# Patient Record
Sex: Male | Born: 1935 | Race: Black or African American | Hispanic: No | Marital: Single | Smoking: Former smoker
Health system: Southern US, Community
[De-identification: ages and names within clinical notes are randomized; demographics above are authoritative.]

## PROBLEM LIST (undated history)

## (undated) DIAGNOSIS — I10 Essential (primary) hypertension: Secondary | ICD-10-CM

## (undated) DIAGNOSIS — R0602 Shortness of breath: Secondary | ICD-10-CM

## (undated) DIAGNOSIS — M1990 Unspecified osteoarthritis, unspecified site: Secondary | ICD-10-CM

## (undated) DIAGNOSIS — E119 Type 2 diabetes mellitus without complications: Secondary | ICD-10-CM

## (undated) DIAGNOSIS — I219 Acute myocardial infarction, unspecified: Secondary | ICD-10-CM

## (undated) DIAGNOSIS — K5909 Other constipation: Secondary | ICD-10-CM

## (undated) DIAGNOSIS — E785 Hyperlipidemia, unspecified: Secondary | ICD-10-CM

## (undated) DIAGNOSIS — H9190 Unspecified hearing loss, unspecified ear: Secondary | ICD-10-CM

## (undated) DIAGNOSIS — K219 Gastro-esophageal reflux disease without esophagitis: Secondary | ICD-10-CM

## (undated) DIAGNOSIS — K08109 Complete loss of teeth, unspecified cause, unspecified class: Secondary | ICD-10-CM

## (undated) DIAGNOSIS — H269 Unspecified cataract: Secondary | ICD-10-CM

## (undated) DIAGNOSIS — R351 Nocturia: Secondary | ICD-10-CM

## (undated) DIAGNOSIS — I2510 Atherosclerotic heart disease of native coronary artery without angina pectoris: Secondary | ICD-10-CM

## (undated) DIAGNOSIS — Z972 Presence of dental prosthetic device (complete) (partial): Secondary | ICD-10-CM

## (undated) DIAGNOSIS — Z9989 Dependence on other enabling machines and devices: Secondary | ICD-10-CM

## (undated) DIAGNOSIS — I4510 Unspecified right bundle-branch block: Secondary | ICD-10-CM

## (undated) DIAGNOSIS — R112 Nausea with vomiting, unspecified: Secondary | ICD-10-CM

## (undated) DIAGNOSIS — C61 Malignant neoplasm of prostate: Secondary | ICD-10-CM

## (undated) DIAGNOSIS — Z9889 Other specified postprocedural states: Secondary | ICD-10-CM

## (undated) DIAGNOSIS — H409 Unspecified glaucoma: Secondary | ICD-10-CM

## (undated) DIAGNOSIS — I739 Peripheral vascular disease, unspecified: Secondary | ICD-10-CM

## (undated) DIAGNOSIS — Z973 Presence of spectacles and contact lenses: Secondary | ICD-10-CM

## (undated) HISTORY — PX: CORONARY ANGIOPLASTY WITH STENT PLACEMENT: SHX49

## (undated) HISTORY — PX: BACK SURGERY: SHX140

---

## 1999-12-15 DIAGNOSIS — I252 Old myocardial infarction: Secondary | ICD-10-CM

## 1999-12-15 DIAGNOSIS — Z955 Presence of coronary angioplasty implant and graft: Secondary | ICD-10-CM

## 1999-12-15 HISTORY — PX: CORONARY ANGIOPLASTY WITH STENT PLACEMENT: SHX49

## 1999-12-15 HISTORY — DX: Old myocardial infarction: I25.2

## 1999-12-15 HISTORY — DX: Presence of coronary angioplasty implant and graft: Z95.5

## 1999-12-24 ENCOUNTER — Encounter: Payer: Self-pay | Admitting: Emergency Medicine

## 1999-12-24 ENCOUNTER — Inpatient Hospital Stay (HOSPITAL_COMMUNITY): Admission: EM | Admit: 1999-12-24 | Discharge: 1999-12-25 | Payer: Self-pay | Admitting: Emergency Medicine

## 2002-06-16 DIAGNOSIS — I219 Acute myocardial infarction, unspecified: Secondary | ICD-10-CM

## 2002-06-16 HISTORY — DX: Acute myocardial infarction, unspecified: I21.9

## 2003-02-15 HISTORY — PX: CORONARY ARTERY BYPASS GRAFT: SHX141

## 2003-02-27 ENCOUNTER — Encounter: Payer: Self-pay | Admitting: Cardiology

## 2003-02-27 ENCOUNTER — Inpatient Hospital Stay (HOSPITAL_COMMUNITY): Admission: EM | Admit: 2003-02-27 | Discharge: 2003-03-07 | Payer: Self-pay | Admitting: Emergency Medicine

## 2003-02-27 DIAGNOSIS — Z951 Presence of aortocoronary bypass graft: Secondary | ICD-10-CM

## 2003-02-27 HISTORY — PX: CORONARY ARTERY BYPASS GRAFT: SHX141

## 2003-02-27 HISTORY — DX: Presence of aortocoronary bypass graft: Z95.1

## 2003-03-01 ENCOUNTER — Encounter: Payer: Self-pay | Admitting: Cardiology

## 2003-03-02 ENCOUNTER — Encounter: Payer: Self-pay | Admitting: Thoracic Surgery (Cardiothoracic Vascular Surgery)

## 2003-03-03 ENCOUNTER — Encounter: Payer: Self-pay | Admitting: Thoracic Surgery (Cardiothoracic Vascular Surgery)

## 2003-03-04 ENCOUNTER — Encounter: Payer: Self-pay | Admitting: Thoracic Surgery (Cardiothoracic Vascular Surgery)

## 2003-03-05 ENCOUNTER — Encounter: Payer: Self-pay | Admitting: Thoracic Surgery (Cardiothoracic Vascular Surgery)

## 2003-03-06 ENCOUNTER — Encounter: Payer: Self-pay | Admitting: Thoracic Surgery (Cardiothoracic Vascular Surgery)

## 2003-03-07 ENCOUNTER — Encounter: Payer: Self-pay | Admitting: Thoracic Surgery (Cardiothoracic Vascular Surgery)

## 2011-06-17 HISTORY — PX: CATARACT EXTRACTION W/ INTRAOCULAR LENS IMPLANT: SHX1309

## 2013-02-25 ENCOUNTER — Other Ambulatory Visit (HOSPITAL_COMMUNITY): Payer: Self-pay | Admitting: Internal Medicine

## 2013-02-25 DIAGNOSIS — K8020 Calculus of gallbladder without cholecystitis without obstruction: Secondary | ICD-10-CM

## 2013-02-28 ENCOUNTER — Encounter (HOSPITAL_COMMUNITY): Payer: Self-pay | Admitting: General Surgery

## 2013-02-28 ENCOUNTER — Inpatient Hospital Stay (HOSPITAL_COMMUNITY)
Admission: EM | Admit: 2013-02-28 | Discharge: 2013-03-03 | DRG: 418 | Disposition: A | Discharge status: Home / Self Care | Payer: Medicare Other | Attending: Internal Medicine | Admitting: Internal Medicine

## 2013-02-28 ENCOUNTER — Other Ambulatory Visit: Payer: Self-pay

## 2013-02-28 DIAGNOSIS — R1011 Right upper quadrant pain: Secondary | ICD-10-CM

## 2013-02-28 DIAGNOSIS — Z9861 Coronary angioplasty status: Secondary | ICD-10-CM

## 2013-02-28 DIAGNOSIS — Z66 Do not resuscitate: Secondary | ICD-10-CM | POA: Diagnosis present

## 2013-02-28 DIAGNOSIS — I10 Essential (primary) hypertension: Secondary | ICD-10-CM

## 2013-02-28 DIAGNOSIS — M1990 Unspecified osteoarthritis, unspecified site: Secondary | ICD-10-CM | POA: Diagnosis present

## 2013-02-28 DIAGNOSIS — Z01810 Encounter for preprocedural cardiovascular examination: Secondary | ICD-10-CM

## 2013-02-28 DIAGNOSIS — K810 Acute cholecystitis: Secondary | ICD-10-CM

## 2013-02-28 DIAGNOSIS — F17200 Nicotine dependence, unspecified, uncomplicated: Secondary | ICD-10-CM | POA: Diagnosis present

## 2013-02-28 DIAGNOSIS — E119 Type 2 diabetes mellitus without complications: Secondary | ICD-10-CM | POA: Diagnosis present

## 2013-02-28 DIAGNOSIS — I25810 Atherosclerosis of coronary artery bypass graft(s) without angina pectoris: Secondary | ICD-10-CM

## 2013-02-28 DIAGNOSIS — I96 Gangrene, not elsewhere classified: Secondary | ICD-10-CM | POA: Diagnosis present

## 2013-02-28 DIAGNOSIS — J90 Pleural effusion, not elsewhere classified: Secondary | ICD-10-CM

## 2013-02-28 DIAGNOSIS — Z951 Presence of aortocoronary bypass graft: Secondary | ICD-10-CM

## 2013-02-28 DIAGNOSIS — K8000 Calculus of gallbladder with acute cholecystitis without obstruction: Principal | ICD-10-CM | POA: Diagnosis present

## 2013-02-28 DIAGNOSIS — K219 Gastro-esophageal reflux disease without esophagitis: Secondary | ICD-10-CM | POA: Diagnosis present

## 2013-02-28 DIAGNOSIS — K8020 Calculus of gallbladder without cholecystitis without obstruction: Secondary | ICD-10-CM

## 2013-02-28 DIAGNOSIS — I252 Old myocardial infarction: Secondary | ICD-10-CM

## 2013-02-28 DIAGNOSIS — E785 Hyperlipidemia, unspecified: Secondary | ICD-10-CM | POA: Diagnosis present

## 2013-02-28 DIAGNOSIS — K819 Cholecystitis, unspecified: Secondary | ICD-10-CM

## 2013-02-28 DIAGNOSIS — I2510 Atherosclerotic heart disease of native coronary artery without angina pectoris: Secondary | ICD-10-CM | POA: Diagnosis present

## 2013-02-28 HISTORY — DX: Unspecified osteoarthritis, unspecified site: M19.90

## 2013-02-28 HISTORY — DX: Gastro-esophageal reflux disease without esophagitis: K21.9

## 2013-02-28 HISTORY — DX: Atherosclerotic heart disease of native coronary artery without angina pectoris: I25.10

## 2013-02-28 HISTORY — DX: Essential (primary) hypertension: I10

## 2013-02-28 HISTORY — DX: Type 2 diabetes mellitus without complications: E11.9

## 2013-02-28 HISTORY — DX: Acute myocardial infarction, unspecified: I21.9

## 2013-02-28 HISTORY — DX: Unspecified cataract: H26.9

## 2013-02-28 HISTORY — DX: Hyperlipidemia, unspecified: E78.5

## 2013-02-28 LAB — COMPREHENSIVE METABOLIC PANEL
ALT: 15 U/L (ref 0–53)
AST: 19 U/L (ref 0–37)
CO2: 25 mEq/L (ref 19–32)
Chloride: 95 mEq/L — ABNORMAL LOW (ref 96–112)
Creatinine, Ser: 1.06 mg/dL (ref 0.50–1.35)
GFR calc non Af Amer: 65 mL/min — ABNORMAL LOW (ref >90)
Total Bilirubin: 1 mg/dL (ref 0.3–1.2)

## 2013-02-28 LAB — CBC WITH DIFFERENTIAL/PLATELET
Basophils Relative: 0 % (ref 0–1)
Eosinophils Absolute: 0 10*3/uL (ref 0.0–0.7)
MCH: 32.5 pg (ref 26.0–34.0)
MCHC: 35.3 g/dL (ref 30.0–36.0)
Neutrophils Relative %: 81 % — ABNORMAL HIGH (ref 43–77)
Platelets: 170 10*3/uL (ref 150–400)
RDW: 13.7 % (ref 11.5–15.5)

## 2013-02-28 LAB — PROTIME-INR: Prothrombin Time: 15.4 seconds — ABNORMAL HIGH (ref 11.6–15.2)

## 2013-02-28 LAB — LIPASE, BLOOD: Lipase: 26 U/L (ref 11–59)

## 2013-02-28 MED ORDER — POTASSIUM CHLORIDE IN NACL 20-0.9 MEQ/L-% IV SOLN
INTRAVENOUS | Status: DC
  Administered 2013-02-28 – 2013-03-02 (×3): via INTRAVENOUS
  Filled 2013-02-28 (×7): qty 1000

## 2013-02-28 MED ORDER — LISINOPRIL 5 MG PO TABS
5.0000 mg | ORAL_TABLET | Freq: Every day | ORAL | Status: DC
  Administered 2013-03-01 – 2013-03-03 (×2): 5 mg via ORAL
  Filled 2013-02-28 (×3): qty 1

## 2013-02-28 MED ORDER — INSULIN ASPART 100 UNIT/ML ~~LOC~~ SOLN: 0.0000 [IU] | Freq: Every day | SUBCUTANEOUS | Status: DC

## 2013-02-28 MED ORDER — PANTOPRAZOLE SODIUM 40 MG PO TBEC
40.0000 mg | DELAYED_RELEASE_TABLET | Freq: Every day | ORAL | Status: DC
  Administered 2013-03-01 – 2013-03-03 (×2): 40 mg via ORAL
  Filled 2013-02-28: qty 1

## 2013-02-28 MED ORDER — ACETAMINOPHEN 325 MG PO TABS: 650.0000 mg | ORAL_TABLET | Freq: Four times a day (QID) | ORAL | Status: DC | PRN

## 2013-02-28 MED ORDER — ONDANSETRON HCL 4 MG PO TABS: 4.0000 mg | ORAL_TABLET | Freq: Four times a day (QID) | ORAL | Status: DC | PRN

## 2013-02-28 MED ORDER — INSULIN ASPART 100 UNIT/ML ~~LOC~~ SOLN
0.0000 [IU] | Freq: Four times a day (QID) | SUBCUTANEOUS | Status: DC
  Administered 2013-03-01: 2 [IU] via SUBCUTANEOUS
  Administered 2013-03-03: 3 [IU] via SUBCUTANEOUS

## 2013-02-28 MED ORDER — SUCRALFATE 1 GM/10ML PO SUSP
1.0000 g | Freq: Four times a day (QID) | ORAL | Status: DC
  Administered 2013-03-01 (×5): 1 g via ORAL
  Filled 2013-02-28 (×9): qty 10

## 2013-02-28 MED ORDER — SODIUM CHLORIDE 0.9 % IJ SOLN
3.0000 mL | Freq: Two times a day (BID) | INTRAMUSCULAR | Status: DC
  Administered 2013-02-28 – 2013-03-03 (×3): 3 mL via INTRAVENOUS

## 2013-02-28 MED ORDER — ATORVASTATIN CALCIUM 40 MG PO TABS
40.0000 mg | ORAL_TABLET | Freq: Every day | ORAL | Status: DC
  Filled 2013-02-28: qty 1

## 2013-02-28 MED ORDER — ACETAMINOPHEN 650 MG RE SUPP: 650.0000 mg | Freq: Four times a day (QID) | RECTAL | Status: DC | PRN

## 2013-02-28 MED ORDER — ENOXAPARIN SODIUM 40 MG/0.4ML ~~LOC~~ SOLN
40.0000 mg | SUBCUTANEOUS | Status: DC
  Administered 2013-03-01: 40 mg via SUBCUTANEOUS
  Filled 2013-02-28: qty 0.4

## 2013-02-28 MED ORDER — MORPHINE SULFATE 2 MG/ML IJ SOLN
2.0000 mg | INTRAMUSCULAR | Status: DC | PRN
  Administered 2013-03-02: 2 mg via INTRAVENOUS
  Filled 2013-02-28: qty 1

## 2013-02-28 MED ORDER — HYDROCODONE-ACETAMINOPHEN 5-325 MG PO TABS
1.0000 | ORAL_TABLET | ORAL | Status: DC | PRN
  Administered 2013-03-02: 2 via ORAL
  Filled 2013-02-28: qty 2

## 2013-02-28 MED ORDER — SODIUM CHLORIDE 0.9 % IV SOLN
INTRAVENOUS | Status: DC
  Administered 2013-02-28 (×2): via INTRAVENOUS

## 2013-02-28 MED ORDER — ONDANSETRON HCL 4 MG/2ML IJ SOLN: 4.0000 mg | Freq: Four times a day (QID) | INTRAMUSCULAR | Status: DC | PRN

## 2013-02-28 MED ORDER — METFORMIN HCL 500 MG PO TABS
250.0000 mg | ORAL_TABLET | Freq: Two times a day (BID) | ORAL | Status: DC
  Filled 2013-02-28 (×2): qty 1

## 2013-02-28 NOTE — ED Notes (Signed)
Spoke with Aundra Millet with Surgery and she felt like EDP should see patient first because they donot have actual scan information

## 2013-02-28 NOTE — ED Notes (Signed)
Per surgery consult: Pt will not be going to surgery today and can be started on a clear liquid diet.

## 2013-02-28 NOTE — ED Notes (Signed)
Attempted to call report.  Floor assigned, waiting on room assignment. Will have RN call when it is assigned.

## 2013-02-28 NOTE — Consult Note (Signed)
Reason for Consult: abdominal pain Referring Physician: Levonne Lapping, NP  HPI:  Harold [name] is a 79 year old AAM with a history of Diabetes Mellitus Type II, Hypertension,HLD, CAD s/p angioplasty x2 in 2001 following by CABG x4 in 2004, GERD, ED, RBBB who initially presented to PCPs office last Friday and was seen by Levonne Lapping, NP for epigastric abdominal pain.  Apparently, he had a [place] which showed "large amount of gas" and therefore unable to be read.  WBC 12.4, normal lipase and BMP.  He presented once again today with worsening symptoms.  He subsequently underwent a CT of abdomen and pelvis which revealed gallstones and concerns for acute cholecystitis along with right side pleural effusion.  His pain initially began on Friday.  Onset was sudden at 2AM.  He denies consuming a high fatty meal.  Coarse is worsening.  Associated with nausea and vomiting, which helped ease symptoms initially.  Reports feeling bloated and unable to pass gas for 2 days now..  Had a poor appetite, chills and sweats.  Denies changes in bowel or urinary pattern.  Last bowel movement was 2 days ago, which is usual for him.  Deneis melena, hematochezia or weight changes.  Denies prior symptoms.  No modifying factors.  He is otherwise doing well.  He has not seen his cardiologist in quite some time.  He does not take blood thinners or NSAIDs.   Past Medical History  Diagnosis Date  . Type I (juvenile type) diabetes mellitus without mention of complication, not stated as uncontrolled   . CAD (coronary artery disease)   . Hyperlipidemia   . Unspecified essential hypertension   . Myocardial infarction   . Cataract     Past Surgical History  Procedure Laterality Date  . Coronary artery bypass graft    . Back surgery  1970s  . Cataract extraction      Family History  Problem Relation Age of Onset  . Heart disease Mother   . Diabetes Mother     Social History:  reports that he has quit smoking. He does not  have any smokeless tobacco history on file. He reports that he does not drink alcohol or use illicit drugs.   Lives with daughter  Allergies: No Known Allergies  Medications: reviewed  Review of Systems  Constitutional: Positive for fever, malaise/fatigue and diaphoresis. Negative for chills and weight loss.  HENT: Negative for congestion and sore throat.   Respiratory: Negative for shortness of breath and wheezing.   Cardiovascular: Negative for chest pain, palpitations and leg swelling.  Gastrointestinal: Positive for nausea, vomiting, abdominal pain and constipation. Negative for diarrhea.  Genitourinary: Negative for dysuria, urgency and hematuria.  Neurological: Positive for dizziness and weakness. Negative for sensory change, speech change, focal weakness, seizures, loss of consciousness and headaches.   Blood pressure 141/65, pulse 80, temperature 97.8 F (36.6 C), temperature source Oral, resp. rate 18, SpO2 98.00%. Physical Exam General: pleasant, WD/WN AA male who is laying in bed in NAD HEENT: head is normocephalic, atraumatic.  Sclera are noninjected.  PERRL.  Ears and nose without any masses or lesions.  Mouth is pink and moist Heart: regular, rate, and rhythm.  Normal s1,s2. No obvious murmurs, gallops, or rubs noted.  Palpable radial and pedal pulses bilaterally Lungs: CTAB, no wheezes, rhonchi, or rales noted.  Respiratory effort nonlabored Abd: thin, soft, ND, moderate to severe tenderness in the RUQ epigastrium, +murphys sign, +BS, no masses, hernias, or organomegaly MS: all 4 extremities  are symmetrical with no cyanosis, clubbing, or edema. Skin: warm and dry with no masses, lesions, or rashes Psych: A&Ox3 with an appropriate affect.   Assessment/Plan: RUQ and epigastric abdominal pain Symptomatic cholelithiasis with probable Acute Cholecystitis  Right sided pleural effusion DM CAD/CABG HTN -recommend evaluation with further work up; CBC, CMP, Lipase, IV  hydration -he may need a cardiac work up, but will leave up to primary team, needs ekg at least right now due to history and pain -IV atbx -Clear liquid diet, NPO after midnight -pain control, antiemetics -we will likely proceed with  laparoscopic cholecystectomy tomorrow pending lab results.  Ashok Norris ANP-BC Pager [phone]  02/28/2013, 4:03 PM

## 2013-02-28 NOTE — H&P (Signed)
PCP:   GATES,ROBERT NEVILL, MD   Chief Complaint:  Abdominal pain  HPI: This is a 79 year old gentleman who presents after he has had abdominal pain since Friday 2 AM. He says PCP on Friday and was sent for ultrasound which was inconclusive due to bowel gas. The pain persisted over the weekend therefore he saw her again today, his CT abdomen and pelvis was ordered which revealed acute cholecystitis. These studies were done at an outpatient center. The patient reports he's had nausea vomiting all weekend, no hematemesis. No diarrhea. Patient's pain is epigastric really located with no radiation. It is sharp and then dull. It is worse with walking and with movement. He denies any fever, chills or diarrhea.   The patient does have a history of coronary artery disease with remote history of CABG over 10 years ago. He states he gets annual stress tests which are normal. He states he never gets chest pain, left arm pain, shortness of breath or wheezing, palpitations, no history of congestive heart failure. The patient lives alone, he does all his ADLs. He mows his lawn, climbs his roof and washes his car.   History provided by the patient as well as his daughter and other family members at the bedside.  Review of Systems:  The patient denies anorexia, fever, weight loss,, vision loss, decreased hearing, hoarseness, chest pain, syncope, dyspnea on exertion, peripheral edema, balance deficits, hemoptysis, melena, hematochezia, severe indigestion/heartburn, hematuria, incontinence, genital sores, muscle weakness, suspicious skin lesions, transient blindness, difficulty walking, depression, unusual weight change, abnormal bleeding, enlarged lymph nodes, angioedema, and breast masses.  Past Medical History: Past Medical History  Diagnosis Date  . Type I (juvenile type) diabetes mellitus without mention of complication, not stated as uncontrolled   . CAD (coronary artery disease)   . Hyperlipidemia   .  Unspecified essential hypertension   . Myocardial infarction   . Cataract    Past Surgical History  Procedure Laterality Date  . Coronary artery bypass graft    . Back surgery  1970s  . Cataract extraction      Medications: Prior to Admission medications   Medication Sig Start Date End Date Taking? Authorizing Provider  aspirin EC 325 MG tablet Take 162.5 mg by mouth daily.   Yes Historical Provider, MD  CARAFATE 1 GM/10ML suspension Take 1 g by mouth 4 (four) times daily.  02/25/13  Yes Historical Provider, MD  lisinopril (PRINIVIL,ZESTRIL) 5 MG tablet Take 5 mg by mouth daily.  02/02/13  Yes Historical Provider, MD  metFORMIN (GLUCOPHAGE) 500 MG tablet Take 250-500 mg by mouth 2 (two) times daily with a meal. 1/2 tablet (250 mg) if cbg under 110, 1 tablet (500 mg) if over 110   Yes Historical Provider, MD  metroNIDAZOLE (FLAGYL) 250 MG tablet Take 250 mg by mouth 2 (two) times daily. 7 day course started 02/25/13 12/22/12  Yes Historical Provider, MD  omeprazole (PRILOSEC) 40 MG capsule Take 40 mg by mouth daily.  02/25/13  Yes Historical Provider, MD  simvastatin (ZOCOR) 80 MG tablet Take 40 mg by mouth daily.   Yes Historical Provider, MD  traMADol (ULTRAM) 50 MG tablet Take 50 mg by mouth every 6 (six) hours as needed for pain.  02/25/13  Yes Historical Provider, MD    Allergies:  No Known Allergies  Social History:  reports that he has quit smoking. He does not have any smokeless tobacco history on file. He reports that he does not drink alcohol or use illicit  drugs.  Family History: Family History  Problem Relation Age of Onset  . Heart disease Mother   . Diabetes Mother     Physical Exam: Filed Vitals:   02/28/13 1816 02/28/13 1900 02/28/13 2000 02/28/13 2100  BP: 157/64 135/69 147/65 159/70  Pulse: 74 78 77 87  Temp: 98.5 F (36.9 C)     TempSrc: Oral     Resp: 18 18 15 16   SpO2: 96% 97% 100% 99%    General:  Alert and oriented times three, well developed and  nourished, no acute distress Eyes: PERRLA, pink conjunctiva, no scleral icterus ENT: Moist oral mucosa, neck supple, no thyromegaly Lungs: clear to ascultation, no wheeze, no crackles, no use of accessory muscles Cardiovascular: regular rate and rhythm, no regurgitation, no gallops, no murmurs. No carotid bruits, no JVD Abdomen: soft, positive BS, , non-distended, positive tenderness to palpation greatest in the epigastric region, no organomegaly, not an acute abdomen GU: not examined Neuro: CN II - XII grossly intact, sensation intact Musculoskeletal: strength 5/5 all extremities, no clubbing, cyanosis or edema Skin: no rash, no subcutaneous crepitation, no decubitus Psych: appropriate patient   Labs on Admission:   Recent Labs  02/28/13 1512  NA 132*  K 4.7  CL 95*  CO2 25  GLUCOSE 130*  BUN 25*  CREATININE 1.06  CALCIUM 10.1    Recent Labs  02/28/13 1512  AST 19  ALT 15  ALKPHOS 74  BILITOT 1.0  PROT 8.0  ALBUMIN 3.5    Recent Labs  02/28/13 1531  LIPASE 26    Recent Labs  02/28/13 1512  WBC 13.0*  NEUTROABS 10.6*  HGB 15.2  HCT 43.0  MCV 91.9  PLT 170    Micro Results: No results found for this or any previous visit (from the past 240 hour(s)).   Radiological Exams on Admission: Dg Outside Films Body  02/28/2013   This examination belongs to an outside facility and is stored  here for comparison purposes only.  Contact the originating outside  institution for any associated report or interpretation.  imaging reviewed    EKG: Bifascicular block, normal sinus rhythm  Assessment/Plan Present on Admission:  Acute cholecystitis: Admit to med telemetry, n.p.o., surgeon on board  As needed pain medication, IV fluid hydration Will call cardiology in the a.m. for clearance, abnormal EKG. Will also request a old EKG from PCPs office for comparison. Coronary artery disease Dyslipidemia Hypertension Diabetes mellitus Stable resume home  medications Sliding scale insulin  DO NOT RESUSCITATE DVT prophylaxis  Lilja Soland 02/28/2013, 9:37 PM

## 2013-02-28 NOTE — ED Notes (Signed)
Pt refusing IV 

## 2013-02-28 NOTE — ED Notes (Signed)
Report to [facility] on 6N.  Pt to be telemetry for at least next 48 hours.

## 2013-02-28 NOTE — ED Notes (Signed)
Pt has inflammed gallbladder with stones and was sent here to be admitted for removal of gallbladder

## 2013-02-28 NOTE — ED Provider Notes (Signed)
CSN: [id number]     Arrival date & time 02/28/13  1422 History   First MD Initiated Contact with Patient 02/28/13 1651     No chief complaint on file.  (Consider location/radiation/quality/duration/timing/severity/associated sxs/prior Treatment) HPI Comments: Harold [name] is a 79 y.o. male who presents for evaluation of an abnormal gallbladder, found on CT imaging, today. He saw his PCP earlier today, was sent for imaging, and then sent to the ED for evaluation. Surgery has been contacted, but wanted an ER evaluation, first. The patient has had decreased appetite, malaise, and a single episode of vomiting in the last 3 days. He denies abnormal stooling, fever, chills, cough, shortness of breath, chest pain, weakness, or dizziness. He has a remote history of coronary artery bypass grafting. He does not see cardiology regularly. He, states that he takes one half aspirin each day. He's been using his medications, as prescribed, without relief of his discomfort. There are no other known modifying factors.  The history is provided by the patient.    Past Medical History  Diagnosis Date  . CAD (coronary artery disease)   . Hyperlipidemia   . Unspecified essential hypertension   . Cataract   . Myocardial infarction 2004  . Type II diabetes mellitus   . GERD (gastroesophageal reflux disease)   . Arthritis     "generalized" (03/01/2013)   Past Surgical History  Procedure Laterality Date  . Cataract extraction w/ intraocular lens implant Left 2013  . Coronary angioplasty with stent placement      "had 2-3; they clogged; he later had OHS" (03/01/2013)  . Coronary artery bypass graft  02/2003    "triple" (03/01/2013)  . Back surgery  1970s   Family History  Problem Relation Age of Onset  . Heart disease Mother   . Diabetes Mother    History  Substance Use Topics  . Smoking status: Current Some Day Smoker -- 30 years    Types: Cigarettes  . Smokeless tobacco: Never Used  . Alcohol Use:  Yes     Comment: 03/01/2013 "drank years ago; never had problem w/it"    Review of Systems  All other systems reviewed and are negative.    Allergies  Review of patient's allergies indicates no known allergies.  Home Medications   No current outpatient prescriptions on file. BP 157/45  Pulse 74  Temp(Src) 98.4 F (36.9 C) (Oral)  Resp 19  Ht 5\' 9"  (1.753 m)  Wt 163 lb 2.3 oz (74 kg)  BMI 24.08 kg/m2  SpO2 99% Physical Exam  Nursing note and vitals reviewed. Constitutional: He is oriented to person, place, and time. He appears well-developed.  Healthy appearing, elderly, male  HENT:  Head: Normocephalic and atraumatic.  Right Ear: External ear normal.  Left Ear: External ear normal.  Eyes: Conjunctivae and EOM are normal. Pupils are equal, round, and reactive to light.  Neck: Normal range of motion and phonation normal. Neck supple.  Cardiovascular: Normal rate, regular rhythm, normal heart sounds and intact distal pulses.   Pulmonary/Chest: Effort normal and breath sounds normal. He exhibits no bony tenderness.  Abdominal: Soft. Normal appearance. He exhibits no distension and no mass. There is no tenderness. There is no guarding.  Musculoskeletal: Normal range of motion. He exhibits no edema.  Neurological: He is alert and oriented to person, place, and time. He has normal strength. No cranial nerve deficit or sensory deficit. He exhibits normal muscle tone. Coordination normal.  Skin: Skin is warm, dry and intact.  Psychiatric: He has a normal mood and affect. His behavior is normal. Judgment and thought content normal.    ED Course  Procedures (including critical care time)  Medications  0.9 %  sodium chloride infusion ( Intravenous Transfusing/Transfer 02/28/13 2059)  sucralfate (CARAFATE) 1 GM/10ML suspension 1 g (1 g Oral Given 03/01/13 1300)  lisinopril (PRINIVIL,ZESTRIL) tablet 5 mg (5 mg Oral Given 03/01/13 1023)  pantoprazole (PROTONIX) EC tablet 40 mg (40 mg  Oral Given 03/01/13 1256)  sodium chloride 0.9 % injection 3 mL (3 mLs Intravenous Not Given 03/01/13 1027)  0.9 % NaCl with KCl 20 mEq/ L  infusion ( Intravenous New Bag/Given 02/28/13 2259)  acetaminophen (TYLENOL) tablet 650 mg (not administered)    Or  acetaminophen (TYLENOL) suppository 650 mg (not administered)  HYDROcodone-acetaminophen (NORCO/VICODIN) 5-325 MG per tablet 1-2 tablet (not administered)  morphine 2 MG/ML injection 2 mg (not administered)  ondansetron (ZOFRAN) tablet 4 mg (not administered)    Or  ondansetron (ZOFRAN) injection 4 mg (not administered)  insulin aspart (novoLOG) injection 0-15 Units (0 Units Subcutaneous Not Given 03/01/13 1200)  insulin aspart (novoLOG) injection 0-5 Units (0 Units Subcutaneous Not Given 02/28/13 2215)  Ampicillin-Sulbactam (UNASYN) 3 g in sodium chloride 0.9 % 100 mL IVPB (3 g Intravenous Given 03/01/13 1256)  carvedilol (COREG) tablet 6.25 mg (not administered)   Patient Vitals for the past 24 hrs:  BP Temp Temp src Pulse Resp SpO2 Height Weight  03/01/13 1407 157/45 mmHg 98.4 F (36.9 C) Oral 74 19 99 % - -  03/01/13 0942 147/61 mmHg 98.1 F (36.7 C) Oral 67 18 99 % - -  03/01/13 0611 147/59 mmHg 98.1 F (36.7 C) Oral 72 16 100 % - -  02/28/13 2201 135/69 mmHg 98.8 F (37.1 C) Oral 79 16 99 % 5\' 9"  (1.753 m) 163 lb 2.3 oz (74 kg)  02/28/13 2100 159/70 mmHg - - 87 16 99 % - -  02/28/13 2000 147/65 mmHg - - 77 15 100 % - -  02/28/13 1900 135/69 mmHg - - 78 18 97 % - -  02/28/13 1816 157/64 mmHg 98.5 F (36.9 C) Oral 74 18 96 % - -  02/28/13 1800 109/43 mmHg - - 75 23 99 % - -  02/28/13 1715 131/47 mmHg - - 79 16 100 % - -     18:00 -I discussed the case with Dr. Lindie Spruce, from Gen. surgery, he will discuss case with Dr. Dwain Sarna and determine if the patient needs additional consultation prior to surgery, and will give a recommendation for who he thinks should admit the patient.  19:40- I contacted Dr. Lindie Spruce again. He, states that Dr.  Dwain Sarna will like him admitted and medical physician and surgery will consult complete the procedure tomorrow. Apparently, the surgery service would like medical clearance, for surgery.   7:43 PM-Consult complete with hospitalist. Patient case explained and discussed. He agrees to admit patient for further evaluation and treatment. Call ended at 2000   Date: 02/28/13  Rate: 81  Rhythm: normal sinus rhythm  QRS Axis: right  PR and QT Intervals: normal  ST/T Wave abnormalities: normal  PR and QRS Conduction Disutrbances:right bundle branch block and left posterior fascicular block  Narrative Interpretation:   Old EKG Reviewed: none available   Labs Review Labs Reviewed  COMPREHENSIVE METABOLIC PANEL - Abnormal; Notable for the following:    Sodium 132 (*)    Chloride 95 (*)    Glucose, Bld 130 (*)  BUN 25 (*)    GFR calc non Af Amer 65 (*)    GFR calc Af Amer 76 (*)    All other components within normal limits  CBC WITH DIFFERENTIAL - Abnormal; Notable for the following:    WBC 13.0 (*)    Neutrophils Relative % 81 (*)    Neutro Abs 10.6 (*)    Lymphocytes Relative 9 (*)    Monocytes Absolute 1.3 (*)    All other components within normal limits  PROTIME-INR - Abnormal; Notable for the following:    Prothrombin Time 15.4 (*)    All other components within normal limits  COMPREHENSIVE METABOLIC PANEL - Abnormal; Notable for the following:    Sodium 134 (*)    Albumin 2.6 (*)    GFR calc non Af Amer 78 (*)    All other components within normal limits  CBC - Abnormal; Notable for the following:    WBC 12.5 (*)    RBC 4.21 (*)    HCT 38.2 (*)    All other components within normal limits  PROTIME-INR - Abnormal; Notable for the following:    Prothrombin Time 15.9 (*)    All other components within normal limits  LIPASE, BLOOD  GLUCOSE, CAPILLARY  GLUCOSE, CAPILLARY  GLUCOSE, CAPILLARY   Imaging Review Dg Outside Films Body  02/28/2013   This examination belongs to  an outside facility and is stored  here for comparison purposes only.  Contact the originating outside  institution for any associated report or interpretation.   MDM   1. Cholecystitis   2. CAD (coronary artery disease) of artery bypass graft   3. Dyslipidemia   4. Preop cardiovascular exam   5. Cholecystitis, acute   6. Diabetes    Cholecystitis, without gallstones, and no obstructive abnormality. He is otherwise stable without additional complaints. He has a remote history of coronary artery disease and CABG and has been managed by his PCP, for at least 10 years without interventions or evaluations by cardiology. He has not had a symptoms concerning for coronary abnormalities. I do not see any overt indications that would prevent operative repair.   Nursing Notes Reviewed/ Care Coordinated, and agree without changes. Applicable Imaging Reviewed.  Interpretation of Laboratory Data incorporated into ED treatment  Plan: Admit  Flint Melter, MD 03/01/13 1601

## 2013-03-01 ENCOUNTER — Encounter (HOSPITAL_COMMUNITY): Payer: Self-pay | Admitting: General Practice

## 2013-03-01 DIAGNOSIS — I2510 Atherosclerotic heart disease of native coronary artery without angina pectoris: Secondary | ICD-10-CM

## 2013-03-01 DIAGNOSIS — K810 Acute cholecystitis: Secondary | ICD-10-CM

## 2013-03-01 DIAGNOSIS — Z01810 Encounter for preprocedural cardiovascular examination: Secondary | ICD-10-CM

## 2013-03-01 DIAGNOSIS — K8000 Calculus of gallbladder with acute cholecystitis without obstruction: Secondary | ICD-10-CM

## 2013-03-01 DIAGNOSIS — E785 Hyperlipidemia, unspecified: Secondary | ICD-10-CM

## 2013-03-01 DIAGNOSIS — E119 Type 2 diabetes mellitus without complications: Secondary | ICD-10-CM

## 2013-03-01 DIAGNOSIS — Z9889 Other specified postprocedural states: Secondary | ICD-10-CM

## 2013-03-01 DIAGNOSIS — I25810 Atherosclerosis of coronary artery bypass graft(s) without angina pectoris: Secondary | ICD-10-CM

## 2013-03-01 HISTORY — DX: Other specified postprocedural states: Z98.890

## 2013-03-01 LAB — COMPREHENSIVE METABOLIC PANEL
Albumin: 2.6 g/dL — ABNORMAL LOW (ref 3.5–5.2)
BUN: 18 mg/dL (ref 6–23)
Calcium: 8.9 mg/dL (ref 8.4–10.5)
Creatinine, Ser: 0.95 mg/dL (ref 0.50–1.35)
Total Protein: 6.5 g/dL (ref 6.0–8.3)

## 2013-03-01 LAB — CBC
HCT: 38.2 % — ABNORMAL LOW (ref 39.0–52.0)
MCV: 90.7 fL (ref 78.0–100.0)
Platelets: 157 10*3/uL (ref 150–400)
RBC: 4.21 MIL/uL — ABNORMAL LOW (ref 4.22–5.81)
WBC: 12.5 10*3/uL — ABNORMAL HIGH (ref 4.0–10.5)

## 2013-03-01 LAB — GLUCOSE, CAPILLARY
Glucose-Capillary: 75 mg/dL (ref 70–99)
Glucose-Capillary: 83 mg/dL (ref 70–99)
Glucose-Capillary: 84 mg/dL (ref 70–99)

## 2013-03-01 MED ORDER — SODIUM CHLORIDE 0.9 % IV SOLN
3.0000 g | Freq: Four times a day (QID) | INTRAVENOUS | Status: DC
  Administered 2013-03-01 – 2013-03-03 (×8): 3 g via INTRAVENOUS
  Filled 2013-03-01 (×11): qty 3

## 2013-03-01 MED ORDER — CARVEDILOL 6.25 MG PO TABS
6.2500 mg | ORAL_TABLET | Freq: Two times a day (BID) | ORAL | Status: DC
  Administered 2013-03-01 – 2013-03-03 (×3): 6.25 mg via ORAL
  Filled 2013-03-01 (×5): qty 1

## 2013-03-01 NOTE — Progress Notes (Signed)
Patient ID: Harold [name], male   DOB: 02/14/1932, 79 y.o.   MRN: [id number]   LOS: 1 day   Subjective: No new c/o. Continues with epigastric pain.   Objective: Vital signs in last 24 hours: Temp:  [97.8 F (36.6 C)-98.8 F (37.1 C)] 98.1 F (36.7 C) (09/16 0942) Pulse Rate:  [67-87] 67 (09/16 0942) Resp:  [15-23] 18 (09/16 0942) BP: (109-159)/(43-70) 147/61 mmHg (09/16 0942) SpO2:  [96 %-100 %] 99 % (09/16 0942) Weight:  [163 lb 2.3 oz (74 kg)] 163 lb 2.3 oz (74 kg) (09/15 2201) Last BM Date: 02/26/13   Laboratory  CBC  Recent Labs  02/28/13 1512 03/01/13 0610  WBC 13.0* 12.5*  HGB 15.2 13.4  HCT 43.0 38.2*  PLT 170 157   BMET  Recent Labs  02/28/13 1512 03/01/13 0610  NA 132* 134*  K 4.7 4.5  CL 95* 102  CO2 25 20  GLUCOSE 130* 82  BUN 25* 18  CREATININE 1.06 0.95  CALCIUM 10.1 8.9    Physical Exam General appearance: alert and no distress Resp: clear to auscultation bilaterally Cardio: regular rate and rhythm GI: Soft, moderate to severe RUQ and epigastric TTP, +BS   Assessment/Plan: Acute cholecystitis -- Plan for OR today if cardiology clears. Continue NPO and abx.     Freeman Caldron, PA-C Pager: [phone] 03/01/2013

## 2013-03-01 NOTE — Progress Notes (Addendum)
Surgery will be delayed to tomorrow.  Started on clears, and NPO after MN.  Pt understands need to push back his surgery.  Continue Unasyn.  Hold Lovenox for tomorrow - already given 1000 dose.

## 2013-03-01 NOTE — Consult Note (Signed)
CARDIOLOGY CONSULT NOTE    Patient ID: Harold [name] MRN: [id number] DOB/AGE: [birth year] 79 y.o.  Admit date: 02/28/2013 Referring Physician:  Joneen Roach Primary Physician: Pearla Dubonnet, MD Primary Cardiologist:  Aubery Lapping in past Reason for Consultation:  Preop Clearance  Active Problems:   Cholecystitis, acute   CAD (coronary artery disease) of artery bypass graft   Diabetes   Dyslipidemia   Hypertension   HPI:   79 yo with cholecystitis Needs laparoscopic surgery.  Had coronary stents in 2000.  Subsequent CABG by Dr Dorris Fetch in 2004.  Has had multiple ETT;s by Dr Kevan Ny since surgery that have been nomral.  Last one within the last 5 years.  Active at home.  Does yard work and mows grass. No chest pain dyspnea, palpitations or syncope.  CRF;s elevated lipids and diabetes.  Compliant with meds. RUQ pain since Friday.  No previous lung disease, bleeding or anethetic complications.  Not septic on antibiotics.  Continued epigastric pain   @ROS @ All other systems reviewed and negative except as noted above  Past Medical History  Diagnosis Date  . CAD (coronary artery disease)   . Hyperlipidemia   . Unspecified essential hypertension   . Cataract   . Myocardial infarction 2004  . Type II diabetes mellitus   . GERD (gastroesophageal reflux disease)   . Arthritis     "generalized" (03/01/2013)    Family History  Problem Relation Age of Onset  . Heart disease Mother   . Diabetes Mother     History   Social History  . Marital Status: Single    Spouse Name: N/A    Number of Children: N/A  . Years of Education: N/A   Occupational History  . Not on file.   Social History Main Topics  . Smoking status: Current Some Day Smoker -- 30 years    Types: Cigarettes  . Smokeless tobacco: Never Used  . Alcohol Use: Yes     Comment: 03/01/2013 "drank years ago; never had problem w/it"  . Drug Use: No  . Sexual Activity: No   Other Topics Concern  . Not on file     Social History Narrative  . No narrative on file    Past Surgical History  Procedure Laterality Date  . Cataract extraction w/ intraocular lens implant Left 2013  . Coronary angioplasty with stent placement      "had 2-3; they clogged; he later had OHS" (03/01/2013)  . Coronary artery bypass graft  02/2003    "triple" (03/01/2013)  . Back surgery  1970s     . ampicillin-sulbactam (UNASYN) IV  3 g Intravenous Q6H  . atorvastatin  40 mg Oral q1800  . enoxaparin (LOVENOX) injection  40 mg Subcutaneous Q24H  . insulin aspart  0-15 Units Subcutaneous Q6H  . insulin aspart  0-5 Units Subcutaneous QHS  . lisinopril  5 mg Oral Daily  . metFORMIN  250-500 mg Oral BID WC  . pantoprazole  40 mg Oral Daily  . sodium chloride  3 mL Intravenous Q12H  . sucralfate  1 g Oral QID   . sodium chloride 125 mL/hr at 02/28/13 1945  . 0.9 % NaCl with KCl 20 mEq / L 75 mL/hr at 02/28/13 2259    Physical Exam: Blood pressure 147/61, pulse 67, temperature 98.1 F (36.7 C), temperature source Oral, resp. rate 18, height 5\' 9"  (1.753 m), weight 163 lb 2.3 oz (74 kg), SpO2 99.00%.   Affect appropriate Healthy:  appears stated age HEENT:  normal Neck supple with no adenopathy JVP normal no bruits no thyromegaly Lungs clear with no wheezing and good diaphragmatic motion Heart:  S1/S2 no murmur, no rub, gallop or click PMI normal Abdomen: with epigastric and RUQ pain to palpation  no bruit.  No HSM or HJR Distal pulses intact with no bruits No edema Neuro non-focal Skin warm and dry No muscular weakness   Labs:   Lab Results  Component Value Date   WBC 12.5* 03/01/2013   HGB 13.4 03/01/2013   HCT 38.2* 03/01/2013   MCV 90.7 03/01/2013   PLT 157 03/01/2013    Recent Labs Lab 03/01/13 0610  NA 134*  K 4.5  CL 102  CO2 20  BUN 18  CREATININE 0.95  CALCIUM 8.9  PROT 6.5  BILITOT 1.0  ALKPHOS 66  ALT 12  AST 18  GLUCOSE 82      Radiology: Dg Outside Films Body  02/28/2013   This  examination belongs to an outside facility and is stored  here for comparison purposes only.  Contact the originating outside  institution for any associated report or interpretation.   EKG:  SR RBBB LPFB     ASSESSMENT AND PLAN:  Preop:  Distant CABG.  Low risk surgery Active with no angina.  ETT;s post CABG with Dr Kevan Ny normal.  Clear to have surgery with no further testing Add low dose beta blocker Choly:  Continue restricted diet and antibiotics.  OR today or tomorrow Elevated lipids : continue statins    Signed: Charlton Haws 03/01/2013, 11:23 AM

## 2013-03-01 NOTE — Progress Notes (Signed)
Patient ID: Harold [name]  male  JXB:[id number]    DOB: 05-Apr-1932    DOA: 02/28/2013  PCP: Pearla Dubonnet, MD  Assessment/Plan: Principal Problem:   Cholecystitis, acute - Cleared by cardiology for surgery,  - Plan for cholecystectomy per general surgery  - Continue Unasyn   Active Problems:   CAD (coronary artery disease) of artery bypass graft - Cleared by cardiology for surgery     Diabetes - Place on sliding scale insulin while inpatient, no metformin     Dyslipidemia - Hold statins for now     Hypertension - BP stable    DVT Prophylaxis:  Code Status:  Disposition:Patient's PCP is Dr. Robley Fries, confirmed with the patient, I have left a voicemail message with [facility] phone number. Dr. Robley Fries will assume care in a.m.    Subjective: Complaining of epigastric pain, awaiting surgery  Objective: Weight change:   Intake/Output Summary (Last 24 hours) at 03/01/13 1411 Last data filed at 02/28/13 2300  Gross per 24 hour  Intake      3 ml  Output      0 ml  Net      3 ml   Blood pressure 157/45, pulse 74, temperature 98.4 F (36.9 C), temperature source Oral, resp. rate 19, height 5\' 9"  (1.753 m), weight 74 kg (163 lb 2.3 oz), SpO2 99.00%.  Physical Exam: General: Alert and awake, oriented x3, not in any acute distress. CVS: S1-S2 clear, no murmur rubs or gallops Chest: CTAB Abdomen: soft tender in right upper quadrant and epigastric region, normal bowel sounds Extremities: no cyanosis, clubbing or edema noted bilaterally   Lab Results: Basic Metabolic Panel:  Recent Labs Lab 02/28/13 1512 03/01/13 0610  NA 132* 134*  K 4.7 4.5  CL 95* 102  CO2 25 20  GLUCOSE 130* 82  BUN 25* 18  CREATININE 1.06 0.95  CALCIUM 10.1 8.9   Liver Function Tests:  Recent Labs Lab 02/28/13 1512 03/01/13 0610  AST 19 18  ALT 15 12  ALKPHOS 74 66  BILITOT 1.0 1.0  PROT 8.0 6.5  ALBUMIN 3.5 2.6*    Recent Labs Lab 02/28/13 1531  LIPASE 26    No results found for this basename: AMMONIA,  in the last 168 hours CBC:  Recent Labs Lab 02/28/13 1512 03/01/13 0610  WBC 13.0* 12.5*  NEUTROABS 10.6*  --   HGB 15.2 13.4  HCT 43.0 38.2*  MCV 91.9 90.7  PLT 170 157   Cardiac Enzymes: No results found for this basename: CKTOTAL, CKMB, CKMBINDEX, TROPONINI,  in the last 168 hours BNP: No components found with this basename: POCBNP,  CBG:  Recent Labs Lab 03/01/13 0010 03/01/13 0611 03/01/13 1215  GLUCAP 83 75 84     Micro Results: No results found for this or any previous visit (from the past 240 hour(s)).  Studies/Results: Dg Outside Films Body  02/28/2013   This examination belongs to an outside facility and is stored  here for comparison purposes only.  Contact the originating outside  institution for any associated report or interpretation.   Medications: Scheduled Meds: . ampicillin-sulbactam (UNASYN) IV  3 g Intravenous Q6H  . atorvastatin  40 mg Oral q1800  . carvedilol  6.25 mg Oral BID WC  . insulin aspart  0-15 Units Subcutaneous Q6H  . insulin aspart  0-5 Units Subcutaneous QHS  . lisinopril  5 mg Oral Daily  . metFORMIN  250-500 mg Oral BID WC  . pantoprazole  40  mg Oral Daily  . sodium chloride  3 mL Intravenous Q12H  . sucralfate  1 g Oral QID      LOS: 1 day   France Noyce M.D. [facility] 03/01/2013, 2:11 PM Pager: [phone]  If 7PM-7AM, please contact night-coverage [email or web address] Password [email or web address]

## 2013-03-01 NOTE — Progress Notes (Signed)
Patient understands surgery may not be today, I discussed the procedure in detail.    We discussed the risks and benefits of a laparoscopic cholecystectomy and possible cholangiogram including, but not limited to bleeding, infection, injury to surrounding structures such as the intestine or liver, bile leak, retained gallstones, need to convert to an open procedure, prolonged diarrhea, blood clots such as  DVT, common bile duct injury, anesthesia risks, and possible need for additional procedures.  The likelihood of improvement in symptoms and return to the patient's normal status is good. We discussed the typical post-operative recovery course.

## 2013-03-02 ENCOUNTER — Encounter (HOSPITAL_COMMUNITY): Payer: Self-pay | Admitting: Anesthesiology

## 2013-03-02 ENCOUNTER — Inpatient Hospital Stay (HOSPITAL_COMMUNITY): Payer: Medicare Other | Admitting: Anesthesiology

## 2013-03-02 ENCOUNTER — Encounter (HOSPITAL_COMMUNITY)
Admission: EM | Disposition: A | Discharge status: Home / Self Care | Payer: Self-pay | Source: Home / Self Care | Attending: Family Medicine

## 2013-03-02 HISTORY — PX: CHOLECYSTECTOMY: SHX55

## 2013-03-02 LAB — GLUCOSE, CAPILLARY
Glucose-Capillary: 88 mg/dL (ref 70–99)
Glucose-Capillary: 119 mg/dL — ABNORMAL HIGH (ref 70–99)

## 2013-03-02 LAB — SURGICAL PCR SCREEN: MRSA, PCR: NEGATIVE

## 2013-03-02 SURGERY — LAPAROSCOPIC CHOLECYSTECTOMY
Anesthesia: General | Site: Abdomen | Wound class: Dirty or Infected

## 2013-03-02 MED ORDER — BUPIVACAINE-EPINEPHRINE PF 0.25-1:200000 % IJ SOLN
INTRAMUSCULAR | Status: AC
  Filled 2013-03-02: qty 30

## 2013-03-02 MED ORDER — HYDROMORPHONE HCL PF 1 MG/ML IJ SOLN: 0.2500 mg | INTRAMUSCULAR | Status: DC | PRN

## 2013-03-02 MED ORDER — GLYCOPYRROLATE 0.2 MG/ML IJ SOLN
INTRAMUSCULAR | Status: DC | PRN
  Administered 2013-03-02: 0.4 mg via INTRAVENOUS

## 2013-03-02 MED ORDER — LIDOCAINE HCL (CARDIAC) 20 MG/ML IV SOLN
INTRAVENOUS | Status: DC | PRN
  Administered 2013-03-02: 100 mg via INTRAVENOUS

## 2013-03-02 MED ORDER — NEOSTIGMINE METHYLSULFATE 1 MG/ML IJ SOLN
INTRAMUSCULAR | Status: DC | PRN
  Administered 2013-03-02: 3 mg via INTRAVENOUS

## 2013-03-02 MED ORDER — BUPIVACAINE-EPINEPHRINE 0.25% -1:200000 IJ SOLN
INTRAMUSCULAR | Status: DC | PRN
  Administered 2013-03-02: 5 mL

## 2013-03-02 MED ORDER — FENTANYL CITRATE 0.05 MG/ML IJ SOLN
INTRAMUSCULAR | Status: DC | PRN
  Administered 2013-03-02 (×2): 50 ug via INTRAVENOUS
  Administered 2013-03-02: 150 ug via INTRAVENOUS

## 2013-03-02 MED ORDER — ROCURONIUM BROMIDE 100 MG/10ML IV SOLN
INTRAVENOUS | Status: DC | PRN
  Administered 2013-03-02: 5 mg via INTRAVENOUS
  Administered 2013-03-02: 50 mg via INTRAVENOUS

## 2013-03-02 MED ORDER — LACTATED RINGERS IV SOLN
INTRAVENOUS | Status: DC | PRN
  Administered 2013-03-02: 08:00:00 via INTRAVENOUS

## 2013-03-02 MED ORDER — ONDANSETRON HCL 4 MG/2ML IJ SOLN
INTRAMUSCULAR | Status: DC | PRN
  Administered 2013-03-02: 4 mg via INTRAVENOUS

## 2013-03-02 MED ORDER — FENTANYL CITRATE 0.05 MG/ML IJ SOLN: 50.0000 ug | Freq: Once | INTRAMUSCULAR | Status: DC

## 2013-03-02 MED ORDER — OXYCODONE HCL 5 MG PO TABS
5.0000 mg | ORAL_TABLET | Freq: Once | ORAL | Status: DC | PRN
Start: 2013-03-02 — End: 2013-03-02

## 2013-03-02 MED ORDER — 0.9 % SODIUM CHLORIDE (POUR BTL) OPTIME
TOPICAL | Status: DC | PRN
  Administered 2013-03-02: 1000 mL

## 2013-03-02 MED ORDER — SODIUM CHLORIDE 0.9 % IR SOLN
Status: DC | PRN
  Administered 2013-03-02 (×2): 1

## 2013-03-02 MED ORDER — OXYCODONE HCL 5 MG/5ML PO SOLN: 5.0000 mg | Freq: Once | ORAL | Status: DC | PRN

## 2013-03-02 MED ORDER — PROMETHAZINE HCL 25 MG/ML IJ SOLN: 6.2500 mg | INTRAMUSCULAR | Status: DC | PRN

## 2013-03-02 MED ORDER — MIDAZOLAM HCL 2 MG/2ML IJ SOLN: 1.0000 mg | INTRAMUSCULAR | Status: DC | PRN

## 2013-03-02 MED ORDER — EPHEDRINE SULFATE 50 MG/ML IJ SOLN
INTRAMUSCULAR | Status: DC | PRN
  Administered 2013-03-02: 10 mg via INTRAVENOUS

## 2013-03-02 MED ORDER — PROPOFOL 10 MG/ML IV BOLUS
INTRAVENOUS | Status: DC | PRN
  Administered 2013-03-02: 120 mg via INTRAVENOUS

## 2013-03-02 SURGICAL SUPPLY — 41 items
APPLIER CLIP 5 13 M/L LIGAMAX5 (MISCELLANEOUS) ×2
BLADE SURG ROTATE 9660 (MISCELLANEOUS) IMPLANT
CANISTER SUCTION 2500CC (MISCELLANEOUS) ×2 IMPLANT
CHLORAPREP W/TINT 26ML (MISCELLANEOUS) ×2 IMPLANT
CLIP APPLIE 5 13 M/L LIGAMAX5 (MISCELLANEOUS) ×1 IMPLANT
CLOTH BEACON ORANGE TIMEOUT ST (SAFETY) ×2 IMPLANT
COVER MAYO STAND STRL (DRAPES) IMPLANT
COVER SURGICAL LIGHT HANDLE (MISCELLANEOUS) ×2 IMPLANT
DECANTER SPIKE VIAL GLASS SM (MISCELLANEOUS) ×2 IMPLANT
DERMABOND ADVANCED (GAUZE/BANDAGES/DRESSINGS) ×1
DERMABOND ADVANCED .7 DNX12 (GAUZE/BANDAGES/DRESSINGS) ×1 IMPLANT
DRAPE C-ARM 42X72 X-RAY (DRAPES) IMPLANT
ELECT REM PT RETURN 9FT ADLT (ELECTROSURGICAL) ×2
ELECTRODE REM PT RTRN 9FT ADLT (ELECTROSURGICAL) ×1 IMPLANT
GLOVE BIO SURGEON STRL SZ7 (GLOVE) ×2 IMPLANT
GLOVE BIO SURGEON STRL SZ7.5 (GLOVE) ×2 IMPLANT
GLOVE BIOGEL PI IND STRL 7.0 (GLOVE) ×1 IMPLANT
GLOVE BIOGEL PI IND STRL 7.5 (GLOVE) ×2 IMPLANT
GLOVE BIOGEL PI INDICATOR 7.0 (GLOVE) ×1
GLOVE BIOGEL PI INDICATOR 7.5 (GLOVE) ×2
GLOVE SURG SS PI 7.0 STRL IVOR (GLOVE) ×4 IMPLANT
GOWN STRL NON-REIN LRG LVL3 (GOWN DISPOSABLE) ×10 IMPLANT
KIT BASIN OR (CUSTOM PROCEDURE TRAY) ×2 IMPLANT
KIT ROOM TURNOVER OR (KITS) ×2 IMPLANT
NS IRRIG 1000ML POUR BTL (IV SOLUTION) ×2 IMPLANT
PAD ARMBOARD 7.5X6 YLW CONV (MISCELLANEOUS) ×2 IMPLANT
POUCH SPECIMEN RETRIEVAL 10MM (ENDOMECHANICALS) ×2 IMPLANT
SCISSORS LAP 5X35 DISP (ENDOMECHANICALS) IMPLANT
SET CHOLANGIOGRAPH 5 50 .035 (SET/KITS/TRAYS/PACK) IMPLANT
SET IRRIG TUBING LAPAROSCOPIC (IRRIGATION / IRRIGATOR) ×2 IMPLANT
SLEEVE ENDOPATH XCEL 5M (ENDOMECHANICALS) ×4 IMPLANT
SPECIMEN JAR SMALL (MISCELLANEOUS) ×2 IMPLANT
SPONGE GAUZE 4X4 12PLY (GAUZE/BANDAGES/DRESSINGS) ×2 IMPLANT
SUT MNCRL AB 4-0 PS2 18 (SUTURE) ×2 IMPLANT
TAPE CLOTH SURG 4X10 WHT LF (GAUZE/BANDAGES/DRESSINGS) ×2 IMPLANT
TOWEL OR 17X24 6PK STRL BLUE (TOWEL DISPOSABLE) ×2 IMPLANT
TOWEL OR 17X26 10 PK STRL BLUE (TOWEL DISPOSABLE) ×2 IMPLANT
TRAY LAPAROSCOPIC (CUSTOM PROCEDURE TRAY) ×2 IMPLANT
TROCAR XCEL BLUNT TIP 100MML (ENDOMECHANICALS) ×2 IMPLANT
TROCAR XCEL NON-BLD 11X100MML (ENDOMECHANICALS) ×2 IMPLANT
TROCAR XCEL NON-BLD 5MMX100MML (ENDOMECHANICALS) ×2 IMPLANT

## 2013-03-02 NOTE — Transfer of Care (Signed)
Immediate Anesthesia Transfer of Care Note  Patient: Harold [name]  Procedure(s) Performed: Procedure(s): LAPAROSCOPIC CHOLECYSTECTOMY (N/A)  Patient Location: PACU  Anesthesia Type:General  Level of Consciousness: awake, alert  and oriented  Airway & Oxygen Therapy: Patient Spontanous Breathing and Patient connected to face mask oxygen  Post-op Assessment: Report given to PACU RN, Post -op Vital signs reviewed and stable and Patient moving all extremities X 4  Post vital signs: Reviewed and stable  Complications: No apparent anesthesia complications

## 2013-03-02 NOTE — Preoperative (Signed)
Beta Blockers   Reason not to administer Beta Blockers:Not Applicable 

## 2013-03-02 NOTE — Anesthesia Procedure Notes (Addendum)
Procedure Name: Intubation Date/Time: 03/02/2013 8:29 AM Performed by: Quentin Ore Pre-anesthesia Checklist: Patient identified, Emergency Drugs available, Suction available, Patient being monitored and Timeout performed Patient Re-evaluated:Patient Re-evaluated prior to inductionOxygen Delivery Method: Circle system utilized Preoxygenation: Pre-oxygenation with 100% oxygen Intubation Type: IV induction Ventilation: Mask ventilation without difficulty and Oral airway inserted - appropriate to patient size Laryngoscope Size: Mac and 3 Grade View: Grade I Tube type: Oral Tube size: 7.5 mm Number of attempts: 1 Airway Equipment and Method: Stylet Placement Confirmation: ETT inserted through vocal cords under direct vision,  positive ETCO2 and breath sounds checked- equal and bilateral Secured at: 23 cm Tube secured with: Tape Dental Injury: Teeth and Oropharynx as per pre-operative assessment  Comments: AOI with MAC 3 per Denna Haggard, paramedic student. Supervised by MDA.

## 2013-03-02 NOTE — Anesthesia Postprocedure Evaluation (Signed)
  Anesthesia Post-op Note  Patient: Harold [name]  Procedure(s) Performed: Procedure(s): LAPAROSCOPIC CHOLECYSTECTOMY (N/A)  Patient Location: PACU  Anesthesia Type:General  Level of Consciousness: awake and alert   Airway and Oxygen Therapy: Patient Spontanous Breathing  Post-op Pain: mild  Post-op Assessment: Post-op Vital signs reviewed, Patient's Cardiovascular Status Stable, Respiratory Function Stable, Patent Airway, No signs of Nausea or vomiting and Pain level controlled  Post-op Vital Signs: Reviewed and stable  Complications: No apparent anesthesia complications

## 2013-03-02 NOTE — Addendum Note (Signed)
Addendum created 03/02/13 1057 by Quentin Ore, CRNA   Modules edited: Anesthesia Blocks and Procedures

## 2013-03-02 NOTE — Progress Notes (Signed)
Subjective: 79 year old male well known to me with a history of type 2 diabetes mellitus, coronary artery disease, hyperlipidemia, and GERD.  No history of right bundle branch block.  Patient developed abdominal pain last week and was seen in our office 5 days ago with epigastric pain.  Abdominal ultrasound was not suggestive of cholecystitis or any other acute surgical process.  Low-grade white count was present.  Patient placed on Carafate and over the weekend vomited once and came back to our office with continued abdominal pain and CT scanning showed acute cholecystitis.  Now on IV Unasyn and feeling better and awaiting surgery this morning.  Hospitalists notify me of a medical admission versus surgical admission last night and I am assuming primary care this morning.  Appreciate surgery's efforts thus far and patient is anxiously awaiting surgery this morning.  Objective: Weight change:   Intake/Output Summary (Last 24 hours) at 03/02/13 0641 Last data filed at 03/01/13 2153  Gross per 24 hour  Intake      3 ml  Output      0 ml  Net      3 ml   Filed Vitals:   03/01/13 0611 03/01/13 0942 03/01/13 1407 03/01/13 2121  BP: 147/59 147/61 157/45 154/59  Pulse: 72 67 74 74  Temp: 98.1 F (36.7 C) 98.1 F (36.7 C) 98.4 F (36.9 C) 100.1 F (37.8 C)  TempSrc: Oral Oral Oral Oral  Resp: 16 18 19 18   Height:      Weight:      SpO2: 100% 99% 99% 99%    General Appearance: Alert, cooperative, no distress, appears stated age Lungs: Clear to auscultation bilaterally, respirations unlabored Heart: Regular rate and rhythm, S1 and S2 normal, no murmur, rub or gallop Abdomen: Very prominent guarding to light palpation in the epigastric area.  More tender in the epigastric area than the right upper quadrant.  Bowel sounds are decreased Extremities: Extremities normal, atraumatic, no cyanosis or edema Neuro: Oriented x3, nonfocal  Lab Results: Results for orders placed during the hospital  encounter of 02/28/13 (from the past 48 hour(s))  COMPREHENSIVE METABOLIC PANEL     Status: Abnormal   Collection Time    02/28/13  3:12 PM      Result Value Range   Sodium 132 (*) 135 - 145 mEq/L   Potassium 4.7  3.5 - 5.1 mEq/L   Chloride 95 (*) 96 - 112 mEq/L   CO2 25  19 - 32 mEq/L   Glucose, Bld 130 (*) 70 - 99 mg/dL   BUN 25 (*) 6 - 23 mg/dL   Creatinine, Ser 1.61  0.50 - 1.35 mg/dL   Calcium 09.6  8.4 - 04.5 mg/dL   Total Protein 8.0  6.0 - 8.3 g/dL   Albumin 3.5  3.5 - 5.2 g/dL   AST 19  0 - 37 U/L   ALT 15  0 - 53 U/L   Alkaline Phosphatase 74  39 - 117 U/L   Total Bilirubin 1.0  0.3 - 1.2 mg/dL   GFR calc non Af Amer 65 (*) >90 mL/min   GFR calc Af Amer 76 (*) >90 mL/min   Comment: (NOTE)     The eGFR has been calculated using the CKD EPI equation.     This calculation has not been validated in all clinical situations.     eGFR's persistently <90 mL/min signify possible Chronic Kidney     Disease.  CBC WITH DIFFERENTIAL     Status:  Abnormal   Collection Time    02/28/13  3:12 PM      Result Value Range   WBC 13.0 (*) 4.0 - 10.5 K/uL   RBC 4.68  4.22 - 5.81 MIL/uL   Hemoglobin 15.2  13.0 - 17.0 g/dL   HCT 16.1  09.6 - 04.5 %   MCV 91.9  78.0 - 100.0 fL   MCH 32.5  26.0 - 34.0 pg   MCHC 35.3  30.0 - 36.0 g/dL   RDW 40.9  81.1 - 91.4 %   Platelets 170  150 - 400 K/uL   Neutrophils Relative % 81 (*) 43 - 77 %   Neutro Abs 10.6 (*) 1.7 - 7.7 K/uL   Lymphocytes Relative 9 (*) 12 - 46 %   Lymphs Abs 1.2  0.7 - 4.0 K/uL   Monocytes Relative 10  3 - 12 %   Monocytes Absolute 1.3 (*) 0.1 - 1.0 K/uL   Eosinophils Relative 0  0 - 5 %   Eosinophils Absolute 0.0  0.0 - 0.7 K/uL   Basophils Relative 0  0 - 1 %   Basophils Absolute 0.0  0.0 - 0.1 K/uL  LIPASE, BLOOD     Status: None   Collection Time    02/28/13  3:31 PM      Result Value Range   Lipase 26  11 - 59 U/L  PROTIME-INR     Status: Abnormal   Collection Time    02/28/13  5:02 PM      Result Value Range    Prothrombin Time 15.4 (*) 11.6 - 15.2 seconds   INR 1.25  0.00 - 1.49  GLUCOSE, CAPILLARY     Status: None   Collection Time    03/01/13 12:10 AM      Result Value Range   Glucose-Capillary 83  70 - 99 mg/dL  COMPREHENSIVE METABOLIC PANEL     Status: Abnormal   Collection Time    03/01/13  6:10 AM      Result Value Range   Sodium 134 (*) 135 - 145 mEq/L   Potassium 4.5  3.5 - 5.1 mEq/L   Chloride 102  96 - 112 mEq/L   CO2 20  19 - 32 mEq/L   Glucose, Bld 82  70 - 99 mg/dL   BUN 18  6 - 23 mg/dL   Creatinine, Ser 7.82  0.50 - 1.35 mg/dL   Calcium 8.9  8.4 - 95.6 mg/dL   Total Protein 6.5  6.0 - 8.3 g/dL   Albumin 2.6 (*) 3.5 - 5.2 g/dL   AST 18  0 - 37 U/L   ALT 12  0 - 53 U/L   Alkaline Phosphatase 66  39 - 117 U/L   Total Bilirubin 1.0  0.3 - 1.2 mg/dL   GFR calc non Af Amer 78 (*) >90 mL/min   GFR calc Af Amer >90  >90 mL/min   Comment: (NOTE)     The eGFR has been calculated using the CKD EPI equation.     This calculation has not been validated in all clinical situations.     eGFR's persistently <90 mL/min signify possible Chronic Kidney     Disease.  CBC     Status: Abnormal   Collection Time    03/01/13  6:10 AM      Result Value Range   WBC 12.5 (*) 4.0 - 10.5 K/uL   RBC 4.21 (*) 4.22 - 5.81 MIL/uL   Hemoglobin 13.4  13.0 - 17.0 g/dL   HCT 47.8 (*) 29.5 - 62.1 %   MCV 90.7  78.0 - 100.0 fL   MCH 31.8  26.0 - 34.0 pg   MCHC 35.1  30.0 - 36.0 g/dL   RDW 30.8  65.7 - 84.6 %   Platelets 157  150 - 400 K/uL  PROTIME-INR     Status: Abnormal   Collection Time    03/01/13  6:10 AM      Result Value Range   Prothrombin Time 15.9 (*) 11.6 - 15.2 seconds   INR 1.30  0.00 - 1.49  GLUCOSE, CAPILLARY     Status: None   Collection Time    03/01/13  6:11 AM      Result Value Range   Glucose-Capillary 75  70 - 99 mg/dL   Comment 1 Notify RN    GLUCOSE, CAPILLARY     Status: None   Collection Time    03/01/13 12:15 PM      Result Value Range   Glucose-Capillary  84  70 - 99 mg/dL  GLUCOSE, CAPILLARY     Status: Abnormal   Collection Time    03/01/13  6:02 PM      Result Value Range   Glucose-Capillary 134 (*) 70 - 99 mg/dL   Comment 1 Notify RN    GLUCOSE, CAPILLARY     Status: None   Collection Time    03/02/13 12:08 AM      Result Value Range   Glucose-Capillary 95  70 - 99 mg/dL  GLUCOSE, CAPILLARY     Status: None   Collection Time    03/02/13  5:49 AM      Result Value Range   Glucose-Capillary 88  70 - 99 mg/dL    Studies/Results: Dg Outside Films Body  02/28/2013   This examination belongs to an outside facility and is stored  here for comparison purposes only.  Contact the originating outside  institution for any associated report or interpretation.  Medications: Scheduled Meds: . ampicillin-sulbactam (UNASYN) IV  3 g Intravenous Q6H  . carvedilol  6.25 mg Oral BID WC  . insulin aspart  0-15 Units Subcutaneous Q6H  . insulin aspart  0-5 Units Subcutaneous QHS  . lisinopril  5 mg Oral Daily  . pantoprazole  40 mg Oral Daily  . sodium chloride  3 mL Intravenous Q12H  . sucralfate  1 g Oral QID   Continuous Infusions: . 0.9 % NaCl with KCl 20 mEq / L 75 mL/hr at 03/01/13 2349   PRN Meds:.acetaminophen, acetaminophen, HYDROcodone-acetaminophen, morphine injection, ondansetron (ZOFRAN) IV, ondansetron  Assessment/Plan: Principal Problem:   Cholecystitis, acute - for surgery this a.m. Active Problems:   CAD (coronary artery disease) of artery bypass graft - asymptomatic for unstable angina.   Diabetes - continue sliding scale insulin and start insulin a.c. at bedtime when eating   Dyslipidemia - resume statin medications at discharge   Hypertension - controlled   Preop cardiovascular exam - clear for surgery    LOS: 2 days   Pearla Dubonnet, MD 03/02/2013, 6:41 AM

## 2013-03-02 NOTE — Op Note (Signed)
Preoperative diagnosis: Acute cholecystitis Postoperative diagnosis: Gangrenous cholecystitis Procedure: Laparoscopic cholecystectomy Surgeon: Dr. Harden Mo Asst.: Dr. Axel Filler Anesthesia: Gen. Estimated blood loss: 50 cc Specimens: Gallbladder and contents to pathology Drains: None Complications: None Sponge and needle count correct at completion Disposition to recovery in stable condition  Indications: This is a 79 year old male who comes in with right upper quadrant pain, elevated white blood cell count, and clinical evidence of cholecystitis. He was placed on antibiotics. He had a CT scan consistent with acute cholecystitis. I discussed going to the operating room for laparoscopic possible open cholecystectomy.  Procedure: After informed consent was obtained the patient was taken to the operating room. He was on an antibiotics regimen on the floor. He had sequential compression devices on his legs. He was placed under general anesthesia without complication. His abdomen was prepped and draped in the standard sterile surgical fashion. A surgical timeout was then performed.  I injected Marcaine below his umbilicus. I made a vertical incision. I entered his fascia sharply and the peritoneum bluntly. I then placed a 0 Vicryl purse string suture through the fascia. I introduced the [facility] trocar and insufflated the abdomen to 15 mm mercury pressure. I then inserted 3 further 5 mm trocars in the epigastrium and right upper quadrant. The gallbladder was not able to be identified initially. It was encased in the omentum. Eventually I was able to peel the omentum off the gallbladder. There was purulence surrounding the gallbladder. The gallbladder was dead and gangrenous. Eventually it was retracted cephalad. It was very difficult to retract this any more than that at this point. I decompressed the gallbladder. While doing this the assistants graspers tore the gallbladder due to necrosis  leading to some spillage of stones. I then sized the epigastric port to a 10 and evacuated all of the stones. Eventually we're able to identify the triangle structures. With some difficulty due to the inflammation I was eventually able to clearly obtain the critical view of safety. I then clipped the duct 3 times and divided it. I treated ordering a similar fashion. The gallbladder was then removed from the liver bed with some difficulty due to the fact it was gangrenous. There was a fair amount of purulence associated with the back wall of the gallbladder on the liver as well. Eventually this was placed in an Endo Catch bag and removed from the umbilicus. I obtained hemostasis. I then irrigated until this was clear. There are not any more stones I could identify that remained. I then desufflated the abdomen removed marine trocars. The umbilical stitch down and this completely obliterated this defect. I then closed these with 4 Monocryl and Dermabond. Steri-Strips were placed. He tolerated this well was extubated and transferred to recovery stable.

## 2013-03-02 NOTE — Anesthesia Preprocedure Evaluation (Addendum)
Anesthesia Evaluation  Patient identified by MRN, date of birth, ID band Patient awake    Reviewed: Allergy & Precautions, H&P , NPO status , Patient's Chart, lab work & pertinent test results  Airway Mallampati: I TM Distance: >3 FB Neck ROM: Full    Dental  (+) Partial Upper and Upper Dentures   Pulmonary  breath sounds clear to auscultation        Cardiovascular hypertension, Pt. on medications + CAD, + Past MI and + CABG Rhythm:Regular Rate:Normal     Neuro/Psych    GI/Hepatic GERD-  Medicated,  Endo/Other  diabetes, Type 2  Renal/GU      Musculoskeletal   Abdominal   Peds  Hematology   Anesthesia Other Findings   Reproductive/Obstetrics                         Anesthesia Physical Anesthesia Plan  ASA: III  Anesthesia Plan: General   Post-op Pain Management:    Induction: Intravenous  Airway Management Planned: Oral ETT  Additional Equipment:   Intra-op Plan:   Post-operative Plan: Extubation in OR  Informed Consent: I have reviewed the patients History and Physical, chart, labs and discussed the procedure including the risks, benefits and alternatives for the proposed anesthesia with the patient or authorized representative who has indicated his/her understanding and acceptance.     Plan Discussed with: CRNA and Surgeon  Anesthesia Plan Comments:         Anesthesia Quick Evaluation

## 2013-03-03 ENCOUNTER — Encounter (HOSPITAL_COMMUNITY): Payer: Self-pay | Admitting: General Surgery

## 2013-03-03 LAB — CBC WITH DIFFERENTIAL/PLATELET
Basophils Relative: 0 % (ref 0–1)
Eosinophils Relative: 0 % (ref 0–5)
Lymphs Abs: 1.3 10*3/uL (ref 0.7–4.0)
MCH: 31.6 pg (ref 26.0–34.0)
MCV: 90 fL (ref 78.0–100.0)
Monocytes Absolute: 1.3 10*3/uL — ABNORMAL HIGH (ref 0.1–1.0)
Platelets: 188 10*3/uL (ref 150–400)
RBC: 4.3 MIL/uL (ref 4.22–5.81)

## 2013-03-03 LAB — BASIC METABOLIC PANEL
BUN: 17 mg/dL (ref 6–23)
CO2: 23 mEq/L (ref 19–32)
Chloride: 107 mEq/L (ref 96–112)
GFR calc Af Amer: >90 mL/min (ref >90)
Potassium: 4.2 mEq/L (ref 3.5–5.1)

## 2013-03-03 LAB — GLUCOSE, CAPILLARY
Glucose-Capillary: 120 mg/dL — ABNORMAL HIGH (ref 70–99)
Glucose-Capillary: 167 mg/dL — ABNORMAL HIGH (ref 70–99)

## 2013-03-03 MED ORDER — AMOXICILLIN-POT CLAVULANATE 875-125 MG PO TABS: 1.0000 | ORAL_TABLET | Freq: Two times a day (BID) | ORAL | Status: DC

## 2013-03-03 NOTE — Progress Notes (Signed)
1 Day Post-Op  Subjective: Patient feels well, has eaten solid breakfast with no nausea, wants to go home and has passed BM already (yesterday post-op).  Objective: Vital signs in last 24 hours: Temp:  [97.6 F (36.4 C)-98.6 F (37 C)] 97.9 F (36.6 C) (09/18 0802) Pulse Rate:  [52-62] 62 (09/18 0802) Resp:  [18-20] 20 (09/18 0802) BP: (138-184)/(47-62) 156/56 mmHg (09/18 0802) SpO2:  [96 %-98 %] 96 % (09/18 0802) Last BM Date: 03/02/13  Intake/Output from previous day: 09/17 0701 - 09/18 0700 In: 1850 [I.V.:1650; IV Piggyback:200] Out: 25 [Blood:25] Intake/Output this shift:    PE: Abd: Soft, mildly tender, mildly distended, NABS, port sites appear in tact and nonerythematous with no exudate CV: RRR with 2+ bilateral radial pulse Neuro: Alert, talkative, EOMI, normal speech, grossly oriented  Lab Results:   Recent Labs  03/01/13 0610 03/03/13 0447  WBC 12.5* 9.1  HGB 13.4 13.6  HCT 38.2* 38.7*  PLT 157 188   BMET  Recent Labs  03/01/13 0610 03/03/13 0447  NA 134* 140  K 4.5 4.2  CL 102 107  CO2 20 23  GLUCOSE 82 134*  BUN 18 17  CREATININE 0.95 0.88  CALCIUM 8.9 8.6   PT/INR  Recent Labs  02/28/13 1702 03/01/13 0610  LABPROT 15.4* 15.9*  INR 1.25 1.30   CMP     Component Value Date/Time   NA 140 03/03/2013 0447   K 4.2 03/03/2013 0447   CL 107 03/03/2013 0447   CO2 23 03/03/2013 0447   GLUCOSE 134* 03/03/2013 0447   BUN 17 03/03/2013 0447   CREATININE 0.88 03/03/2013 0447   CALCIUM 8.6 03/03/2013 0447   PROT 6.5 03/01/2013 0610   ALBUMIN 2.6* 03/01/2013 0610   AST 18 03/01/2013 0610   ALT 12 03/01/2013 0610   ALKPHOS 66 03/01/2013 0610   BILITOT 1.0 03/01/2013 0610   GFRNONAA 80* 03/03/2013 0447   GFRAA >90 03/03/2013 0447   Lipase     Component Value Date/Time   LIPASE 26 02/28/2013 1531       Studies/Results: No results found.  Anti-infectives: Anti-infectives   Start     Dose/Rate Route Frequency Ordered Stop   03/01/13 1200   Ampicillin-Sulbactam (UNASYN) 3 g in sodium chloride 0.9 % 100 mL IVPB     3 g 100 mL/hr over 60 Minutes Intravenous Every 6 hours 03/01/13 1001        Assessment/Plan 1. Acute cholecystitis with gangrenous cholecystitis seen in OR 2. CAD 3. Diabetes 4. Dyslidpidemia 5. HTN  Plan: - POD#1 s/p laparoscopic cholecystectomy by Dr. Dwain Sarna 9/17 - Afebrile and no leukocytosis currently, feeling well and passed stool after operation, now eating with no nausea and with NABS - okay from surgical standpoint for discharge home - Patient will need to continue PO antibiotics for 1 week total antibiotic therapy - Return precautions discussed with patient who voiced understanding   LOS: 3 days    Leona Singleton, MD 03/03/2013, 9:54 AM Pager: [phone]

## 2013-03-03 NOTE — Discharge Summary (Signed)
Physician Discharge Summary  NAME:Harold [name]  HQI:[id number]  DOB: 09-25-1931   Admit date: 02/28/2013 Discharge date: 03/03/2013  Discharge Diagnoses:  Principal Problem:   Cholecystitis, acute - cholecystectomy performed on 03/02/2013 by Dr. Emelia Loron - gallbladder was gangrenous Active Problems:   CAD (coronary artery disease) of artery bypass graft   Diabetes   Dyslipidemia   Hypertension   Preop cardiovascular exam   Discharge Physical Exam:  General Appearance: Alert, cooperative, no distress, appears stated age.  Feels much better today and is eating well  Weight change:   Intake/Output Summary (Last 24 hours) at 03/03/13 0745 Last data filed at 03/03/13 0500  Gross per 24 hour  Intake   1850 ml  Output     25 ml  Net   1825 ml   Filed Vitals:   03/02/13 1427 03/02/13 2114 03/03/13 0333 03/03/13 0550  BP: 184/62 150/47 138/48 157/61  Pulse: 57 60 61 62  Temp: 97.7 F (36.5 C) 98.1 F (36.7 C) 98.6 F (37 C) 98.4 F (36.9 C)  TempSrc: Oral Oral Oral Oral  Resp: 18 18 18 18   Height:      Weight:      SpO2: 97% 98% 98% 96%    Lungs: Clear to auscultation bilaterally, respirations unlabored Heart: Regular rate and rhythm, S1 and S2 normal, no murmur, rub or gallop Abdomen: Surgical sites are non-weeping.  Abdomen is soft and minimally tender. Extremities: Extremities normal, atraumatic, no cyanosis or edema Neuro: Alert and oriented, nonfocal, ambulatory to the bathroom  Discharge Condition: Much improved  Hospital Course: [name] is a very pleasant 79 year old male who I followed for many years, [facility].  He has a history of hypertension and diabetes.  He presented to our office on September 12 complaining of 2 days of abdominal pain.  Abdominal ultrasound was nonspecific.  White count was minimally elevated.  Patient was provided Carafate in addition to an acid 7 informed to call us over the weekend if he did not  improve.  He had nausea and vomiting x1 but continued pain to the weekend.  He did not attempt to recontact [place] over the weekend until Monday, September 15 per he was reevaluated in our office and abdominal CT scan revealed acute cholecystitis.  He was admitted and IV antibiotics were started and surgery was consulted and ultimately he underwent laparoscopic cholecystectomy on September 17, yesterday,.  He was found to have a gangrenous gallbladder.  He was treated with IV Unasyn while hospitalized.  He feels well and is eating well and is ready to go home but will need surgical clearance and also clarification about whether or not he should have an antibiotic after discharge.  Things to follow up in the outpatient setting: Fever and abdominal pain  Consults: Treatment Team:  Emelia Loron Md - CCS, MD [facility], MD Marden Noble, MD  Disposition:   Discharge Orders   Future Orders Complete By Expires   Call MD for:  difficulty breathing, headache or visual disturbances  As directed    Call MD for:  persistant dizziness or light-headedness  As directed    Call MD for:  persistant nausea and vomiting  As directed    Call MD for:  redness, tenderness, or signs of infection (pain, swelling, redness, odor or green/yellow discharge around incision site)  As directed    Call MD for:  severe uncontrolled pain  As directed    Call MD for:  temperature >100.4  As directed    Diet - low sodium heart healthy  As directed    Increase activity slowly  As directed        Medication List    STOP taking these medications       CARAFATE 1 GM/10ML suspension  Generic drug:  sucralfate     metroNIDAZOLE 250 MG tablet  Commonly known as:  FLAGYL      TAKE these medications       aspirin EC 325 MG tablet  Take 162.5 mg by mouth daily.     lisinopril 5 MG tablet  Commonly known as:  PRINIVIL,ZESTRIL  Take 5 mg by mouth daily.     metFORMIN 500 MG tablet  Commonly known as:   GLUCOPHAGE  Take 250-500 mg by mouth 2 (two) times daily with a meal. 1/2 tablet (250 mg) if cbg under 110, 1 tablet (500 mg) if over 110     omeprazole 40 MG capsule  Commonly known as:  PRILOSEC  Take 40 mg by mouth daily.     simvastatin 80 MG tablet  Commonly known as:  ZOCOR  Take 40 mg by mouth daily.     traMADol 50 MG tablet  Commonly known as:  ULTRAM  Take 50 mg by mouth every 6 (six) hours as needed for pain.         The results of significant diagnostics from this hospitalization (including imaging, microbiology, ancillary and laboratory) are listed below for reference.    Significant Diagnostic Studies: Dg Outside Films Body  02/28/2013   This examination belongs to an outside facility and is stored  here for comparison purposes only.  Contact the originating outside  institution for any associated report or interpretation.   Microbiology: Recent Results (from the past 240 hour(s))  SURGICAL PCR SCREEN     Status: None   Collection Time    03/02/13  6:11 AM      Result Value Range Status   MRSA, PCR NEGATIVE  NEGATIVE Final   Staphylococcus aureus NEGATIVE  NEGATIVE Final   Comment:            The Xpert SA Assay (FDA     approved for NASAL specimens     in patients over 62 years of age),     is one component of     a comprehensive surveillance     program.  Test performance has     been validated by [employer] for patients greater     than or equal to 45 year old.     It is not intended     to diagnose infection nor to     guide or monitor treatment.     Labs: Results for orders placed during the hospital encounter of 02/28/13  SURGICAL PCR SCREEN      Result Value Range   MRSA, PCR NEGATIVE  NEGATIVE   Staphylococcus aureus NEGATIVE  NEGATIVE  COMPREHENSIVE METABOLIC PANEL      Result Value Range   Sodium 132 (*) 135 - 145 mEq/L   Potassium 4.7  3.5 - 5.1 mEq/L   Chloride 95 (*) 96 - 112 mEq/L   CO2 25  19 - 32 mEq/L   Glucose, Bld 130  (*) 70 - 99 mg/dL   BUN 25 (*) 6 - 23 mg/dL   Creatinine, Ser 7.82  0.50 - 1.35 mg/dL   Calcium 95.6  8.4 - 21.3 mg/dL   Total Protein  8.0  6.0 - 8.3 g/dL   Albumin 3.5  3.5 - 5.2 g/dL   AST 19  0 - 37 U/L   ALT 15  0 - 53 U/L   Alkaline Phosphatase 74  39 - 117 U/L   Total Bilirubin 1.0  0.3 - 1.2 mg/dL   GFR calc non Af Amer 65 (*) >90 mL/min   GFR calc Af Amer 76 (*) >90 mL/min  CBC WITH DIFFERENTIAL      Result Value Range   WBC 13.0 (*) 4.0 - 10.5 K/uL   RBC 4.68  4.22 - 5.81 MIL/uL   Hemoglobin 15.2  13.0 - 17.0 g/dL   HCT 40.9  81.1 - 91.4 %   MCV 91.9  78.0 - 100.0 fL   MCH 32.5  26.0 - 34.0 pg   MCHC 35.3  30.0 - 36.0 g/dL   RDW 78.2  95.6 - 21.3 %   Platelets 170  150 - 400 K/uL   Neutrophils Relative % 81 (*) 43 - 77 %   Neutro Abs 10.6 (*) 1.7 - 7.7 K/uL   Lymphocytes Relative 9 (*) 12 - 46 %   Lymphs Abs 1.2  0.7 - 4.0 K/uL   Monocytes Relative 10  3 - 12 %   Monocytes Absolute 1.3 (*) 0.1 - 1.0 K/uL   Eosinophils Relative 0  0 - 5 %   Eosinophils Absolute 0.0  0.0 - 0.7 K/uL   Basophils Relative 0  0 - 1 %   Basophils Absolute 0.0  0.0 - 0.1 K/uL  LIPASE, BLOOD      Result Value Range   Lipase 26  11 - 59 U/L  PROTIME-INR      Result Value Range   Prothrombin Time 15.4 (*) 11.6 - 15.2 seconds   INR 1.25  0.00 - 1.49  COMPREHENSIVE METABOLIC PANEL      Result Value Range   Sodium 134 (*) 135 - 145 mEq/L   Potassium 4.5  3.5 - 5.1 mEq/L   Chloride 102  96 - 112 mEq/L   CO2 20  19 - 32 mEq/L   Glucose, Bld 82  70 - 99 mg/dL   BUN 18  6 - 23 mg/dL   Creatinine, Ser 0.86  0.50 - 1.35 mg/dL   Calcium 8.9  8.4 - 57.8 mg/dL   Total Protein 6.5  6.0 - 8.3 g/dL   Albumin 2.6 (*) 3.5 - 5.2 g/dL   AST 18  0 - 37 U/L   ALT 12  0 - 53 U/L   Alkaline Phosphatase 66  39 - 117 U/L   Total Bilirubin 1.0  0.3 - 1.2 mg/dL   GFR calc non Af Amer 78 (*) >90 mL/min   GFR calc Af Amer >90  >90 mL/min  CBC      Result Value Range   WBC 12.5 (*) 4.0 - 10.5 K/uL   RBC  4.21 (*) 4.22 - 5.81 MIL/uL   Hemoglobin 13.4  13.0 - 17.0 g/dL   HCT 46.9 (*) 62.9 - 52.8 %   MCV 90.7  78.0 - 100.0 fL   MCH 31.8  26.0 - 34.0 pg   MCHC 35.1  30.0 - 36.0 g/dL   RDW 41.3  24.4 - 01.0 %   Platelets 157  150 - 400 K/uL  PROTIME-INR      Result Value Range   Prothrombin Time 15.9 (*) 11.6 - 15.2 seconds   INR 1.30  0.00 - 1.49  GLUCOSE, CAPILLARY      Result Value Range   Glucose-Capillary 83  70 - 99 mg/dL  GLUCOSE, CAPILLARY      Result Value Range   Glucose-Capillary 75  70 - 99 mg/dL   Comment 1 Notify RN    GLUCOSE, CAPILLARY      Result Value Range   Glucose-Capillary 84  70 - 99 mg/dL  GLUCOSE, CAPILLARY      Result Value Range   Glucose-Capillary 134 (*) 70 - 99 mg/dL   Comment 1 Notify RN    GLUCOSE, CAPILLARY      Result Value Range   Glucose-Capillary 95  70 - 99 mg/dL  GLUCOSE, CAPILLARY      Result Value Range   Glucose-Capillary 88  70 - 99 mg/dL  GLUCOSE, CAPILLARY      Result Value Range   Glucose-Capillary 119 (*) 70 - 99 mg/dL  GLUCOSE, CAPILLARY      Result Value Range   Glucose-Capillary 116 (*) 70 - 99 mg/dL   Comment 1 Notify RN    CBC WITH DIFFERENTIAL      Result Value Range   WBC 9.1  4.0 - 10.5 K/uL   RBC 4.30  4.22 - 5.81 MIL/uL   Hemoglobin 13.6  13.0 - 17.0 g/dL   HCT 40.9 (*) 81.1 - 91.4 %   MCV 90.0  78.0 - 100.0 fL   MCH 31.6  26.0 - 34.0 pg   MCHC 35.1  30.0 - 36.0 g/dL   RDW 78.2  95.6 - 21.3 %   Platelets 188  150 - 400 K/uL   Neutrophils Relative % 72  43 - 77 %   Lymphocytes Relative 14  12 - 46 %   Monocytes Relative 14 (*) 3 - 12 %   Eosinophils Relative 0  0 - 5 %   Basophils Relative 0  0 - 1 %   Neutro Abs 6.5  1.7 - 7.7 K/uL   Lymphs Abs 1.3  0.7 - 4.0 K/uL   Monocytes Absolute 1.3 (*) 0.1 - 1.0 K/uL   Eosinophils Absolute 0.0  0.0 - 0.7 K/uL   Basophils Absolute 0.0  0.0 - 0.1 K/uL  BASIC METABOLIC PANEL      Result Value Range   Sodium 140  135 - 145 mEq/L   Potassium 4.2  3.5 - 5.1 mEq/L    Chloride 107  96 - 112 mEq/L   CO2 23  19 - 32 mEq/L   Glucose, Bld 134 (*) 70 - 99 mg/dL   BUN 17  6 - 23 mg/dL   Creatinine, Ser 0.86  0.50 - 1.35 mg/dL   Calcium 8.6  8.4 - 57.8 mg/dL   GFR calc non Af Amer 80 (*) >90 mL/min   GFR calc Af Amer >90  >90 mL/min  GLUCOSE, CAPILLARY      Result Value Range   Glucose-Capillary 167 (*) 70 - 99 mg/dL   Comment 1 Documented in Chart     Comment 2 Notify RN    GLUCOSE, CAPILLARY      Result Value Range   Glucose-Capillary 120 (*) 70 - 99 mg/dL   Comment 1 Documented in Chart     Comment 2 Notify RN      Time coordinating discharge: 34 minutes  Signed: Pearla Dubonnet, MD 03/03/2013, 7:45 AM

## 2013-03-03 NOTE — Progress Notes (Signed)
Discharge instructions given. Pt verbalized understanding and all questions were answered.  

## 2013-03-03 NOTE — Progress Notes (Addendum)
Patient ID: Harold [name], male   DOB: 1931-10-28, 79 y.o.   MRN: [id number] Subjective: Wants to go home Taking PO No angina/chest pain  Objective: Weight change:   Intake/Output Summary (Last 24 hours) at 03/03/13 0829 Last data filed at 03/03/13 0500  Gross per 24 hour  Intake   1850 ml  Output     25 ml  Net   1825 ml   Filed Vitals:   03/02/13 2114 03/03/13 0333 03/03/13 0550 03/03/13 0802  BP: 150/47 138/48 157/61 156/56  Pulse: 60 61 62 62  Temp: 98.1 F (36.7 C) 98.6 F (37 C) 98.4 F (36.9 C) 97.9 F (36.6 C)  TempSrc: Oral Oral Oral Oral  Resp: 18 18 18 20   Height:      Weight:      SpO2: 98% 98% 96% 96%    General Appearance: Alert, cooperative, no distress, appears stated age Lungs: Clear to auscultation bilaterally, respirations unlabored Heart: Regular rate and rhythm, S1 and S2 normal, no murmur, rub or gallop Abdomen: Very prominent guarding to light palpation in the epigastric area.  More tender in the epigastric area than the right upper quadrant.  Bowel sounds are decreased Extremities: Extremities normal, atraumatic, no cyanosis or edema Neuro: Oriented x3, nonfocal  Lab Results: Results for orders placed during the hospital encounter of 02/28/13 (from the past 48 hour(s))  GLUCOSE, CAPILLARY     Status: None   Collection Time    03/01/13 12:15 PM      Result Value Range   Glucose-Capillary 84  70 - 99 mg/dL  GLUCOSE, CAPILLARY     Status: Abnormal   Collection Time    03/01/13  6:02 PM      Result Value Range   Glucose-Capillary 134 (*) 70 - 99 mg/dL   Comment 1 Notify RN    GLUCOSE, CAPILLARY     Status: None   Collection Time    03/02/13 12:08 AM      Result Value Range   Glucose-Capillary 95  70 - 99 mg/dL  GLUCOSE, CAPILLARY     Status: None   Collection Time    03/02/13  5:49 AM      Result Value Range   Glucose-Capillary 88  70 - 99 mg/dL  SURGICAL PCR SCREEN     Status: None   Collection Time    03/02/13  6:11 AM       Result Value Range   MRSA, PCR NEGATIVE  NEGATIVE   Staphylococcus aureus NEGATIVE  NEGATIVE   Comment:            The Xpert SA Assay (FDA     approved for NASAL specimens     in patients over 85 years of age),     is one component of     a comprehensive surveillance     program.  Test performance has     been validated by [employer] for patients greater     than or equal to 43 year old.     It is not intended     to diagnose infection nor to     guide or monitor treatment.  GLUCOSE, CAPILLARY     Status: Abnormal   Collection Time    03/02/13  9:53 AM      Result Value Range   Glucose-Capillary 119 (*) 70 - 99 mg/dL  GLUCOSE, CAPILLARY     Status: Abnormal   Collection Time  03/02/13 12:04 PM      Result Value Range   Glucose-Capillary 116 (*) 70 - 99 mg/dL   Comment 1 Notify RN    GLUCOSE, CAPILLARY     Status: Abnormal   Collection Time    03/03/13 12:20 AM      Result Value Range   Glucose-Capillary 167 (*) 70 - 99 mg/dL   Comment 1 Documented in Chart     Comment 2 Notify RN    CBC WITH DIFFERENTIAL     Status: Abnormal   Collection Time    03/03/13  4:47 AM      Result Value Range   WBC 9.1  4.0 - 10.5 K/uL   RBC 4.30  4.22 - 5.81 MIL/uL   Hemoglobin 13.6  13.0 - 17.0 g/dL   HCT 16.1 (*) 09.6 - 04.5 %   MCV 90.0  78.0 - 100.0 fL   MCH 31.6  26.0 - 34.0 pg   MCHC 35.1  30.0 - 36.0 g/dL   RDW 40.9  81.1 - 91.4 %   Platelets 188  150 - 400 K/uL   Neutrophils Relative % 72  43 - 77 %   Lymphocytes Relative 14  12 - 46 %   Monocytes Relative 14 (*) 3 - 12 %   Eosinophils Relative 0  0 - 5 %   Basophils Relative 0  0 - 1 %   Neutro Abs 6.5  1.7 - 7.7 K/uL   Lymphs Abs 1.3  0.7 - 4.0 K/uL   Monocytes Absolute 1.3 (*) 0.1 - 1.0 K/uL   Eosinophils Absolute 0.0  0.0 - 0.7 K/uL   Basophils Absolute 0.0  0.0 - 0.1 K/uL  BASIC METABOLIC PANEL     Status: Abnormal   Collection Time    03/03/13  4:47 AM      Result Value Range   Sodium 140  135 - 145 mEq/L    Potassium 4.2  3.5 - 5.1 mEq/L   Chloride 107  96 - 112 mEq/L   CO2 23  19 - 32 mEq/L   Glucose, Bld 134 (*) 70 - 99 mg/dL   BUN 17  6 - 23 mg/dL   Creatinine, Ser 7.82  0.50 - 1.35 mg/dL   Calcium 8.6  8.4 - 95.6 mg/dL   GFR calc non Af Amer 80 (*) >90 mL/min   GFR calc Af Amer >90  >90 mL/min   Comment: (NOTE)     The eGFR has been calculated using the CKD EPI equation.     This calculation has not been validated in all clinical situations.     eGFR's persistently <90 mL/min signify possible Chronic Kidney     Disease.  GLUCOSE, CAPILLARY     Status: Abnormal   Collection Time    03/03/13  5:51 AM      Result Value Range   Glucose-Capillary 120 (*) 70 - 99 mg/dL   Comment 1 Documented in Chart     Comment 2 Notify RN      Studies/Results: No results found. Medications: Scheduled Meds: . ampicillin-sulbactam (UNASYN) IV  3 g Intravenous Q6H  . carvedilol  6.25 mg Oral BID WC  . fentaNYL  50-100 mcg Intravenous Once  . insulin aspart  0-15 Units Subcutaneous Q6H  . insulin aspart  0-5 Units Subcutaneous QHS  . lisinopril  5 mg Oral Daily  . pantoprazole  40 mg Oral Daily  . sodium chloride  3 mL Intravenous Q12H  Continuous Infusions: . 0.9 % NaCl with KCl 20 mEq / L 75 mL/hr at 03/02/13 2142   PRN Meds:.HYDROcodone-acetaminophen, midazolam, morphine injection, ondansetron (ZOFRAN) IV, ondansetron  Assessment/Plan: CAD:  Distant CABG  No angina statble continue asa and beta blocker RBBB:  Stable with no high grade AV block GB:  Recovering nicely from surgery  Continue ampicillin-sublactam    LOS: 3 days   Charlton Haws

## 2013-03-03 NOTE — Progress Notes (Signed)
Home today

## 2013-03-22 ENCOUNTER — Ambulatory Visit (INDEPENDENT_AMBULATORY_CARE_PROVIDER_SITE_OTHER): Payer: Medicare Other | Admitting: General Surgery

## 2013-03-22 ENCOUNTER — Encounter (INDEPENDENT_AMBULATORY_CARE_PROVIDER_SITE_OTHER): Payer: Self-pay | Admitting: General Surgery

## 2013-03-22 VITALS — BP 134/82 | HR 80 | Temp 98.0°F | Resp 18 | Ht 70.0 in | Wt 158.6 lb

## 2013-03-22 DIAGNOSIS — K8000 Calculus of gallbladder with acute cholecystitis without obstruction: Secondary | ICD-10-CM

## 2013-03-22 NOTE — Progress Notes (Signed)
Harold [name] 10-Jul-1931 [id number] 03/22/2013   Harold [name] is a 79 y.o. male who had a laparoscopic cholecystectomy with intraoperative cholangiogram by Dr. Dwain Sarna.  The pathology report confirmed acute suppurative cholecystitis and cholelithiasis.  The patient reports that they are feeling well with normal bowel movements and good appetite.  The pre-operative symptoms of abdominal pain, nausea, and vomiting have resolved.    Physical examination - Incisions appear well-healed with no sign of infection or bleeding.   Abdomen - soft, non-tender  Impression:  s/p laparoscopic cholecystectomy  Plan:  He may resume a regular diet and full activity.  He may follow-up on a PRN basis.;l

## 2013-03-22 NOTE — Patient Instructions (Addendum)
Follow up as needed

## 2019-07-31 ENCOUNTER — Ambulatory Visit: Payer: PRIVATE HEALTH INSURANCE | Attending: Internal Medicine

## 2019-07-31 DIAGNOSIS — Z23 Encounter for immunization: Secondary | ICD-10-CM | POA: Insufficient documentation

## 2019-07-31 NOTE — Progress Notes (Signed)
   Covid-19 Vaccination Clinic  Name:  Harold [name]    MRN: [id number] DOB: [birth year]  07/31/2019  [name] was observed post Covid-19 immunization for 15 minutes without incidence. He was provided with Vaccine Information Sheet and instruction to access the V-Safe system.   [name] was instructed to call 911 with any severe reactions post vaccine: Marland Kitchen Difficulty breathing  . Swelling of your face and throat  . A fast heartbeat  . A bad rash all over your body  . Dizziness and weakness    Immunizations Administered    Name Date Dose VIS Date Route   Pfizer COVID-19 Vaccine 07/31/2019  1:19 PM 0.3 mL 05/27/2019 Intramuscular   Manufacturer: [place]   Lot: [id number]   [place]: [id number]

## 2019-08-23 ENCOUNTER — Ambulatory Visit: Payer: PRIVATE HEALTH INSURANCE | Attending: Internal Medicine

## 2019-08-23 DIAGNOSIS — Z23 Encounter for immunization: Secondary | ICD-10-CM

## 2019-08-23 NOTE — Progress Notes (Signed)
   Covid-19 Vaccination Clinic  Name:  TIRAN KOCHEL    MRN: [id number] DOB: 1932-04-10  08/23/2019  Mr. Fullen was observed post Covid-19 immunization for 15 minutes without incident. He was provided with Vaccine Information Sheet and instruction to access the V-Safe system.   Mr. Travers was instructed to call 911 with any severe reactions post vaccine: Marland Kitchen Difficulty breathing  . Swelling of face and throat  . A fast heartbeat  . A bad rash all over body  . Dizziness and weakness   Immunizations Administered    Name Date Dose VIS Date Route   Pfizer COVID-19 Vaccine 08/23/2019 11:20 AM 0.3 mL 05/27/2019 Intramuscular   Manufacturer: [place]   Lot: [id number]   [place]: [id number]

## 2019-08-24 ENCOUNTER — Ambulatory Visit: Payer: PRIVATE HEALTH INSURANCE

## 2020-05-07 ENCOUNTER — Ambulatory Visit
Admission: RE | Admit: 2020-05-07 | Discharge: 2020-05-07 | Disposition: A | Discharge status: Home / Self Care | Payer: No Typology Code available for payment source | Source: Ambulatory Visit | Attending: Internal Medicine | Admitting: Internal Medicine

## 2020-05-07 ENCOUNTER — Other Ambulatory Visit: Payer: Self-pay | Admitting: Internal Medicine

## 2020-05-07 DIAGNOSIS — R0689 Other abnormalities of breathing: Secondary | ICD-10-CM

## 2020-05-08 ENCOUNTER — Other Ambulatory Visit: Payer: Self-pay | Admitting: Internal Medicine

## 2020-05-08 DIAGNOSIS — J90 Pleural effusion, not elsewhere classified: Secondary | ICD-10-CM

## 2020-05-29 ENCOUNTER — Other Ambulatory Visit: Payer: No Typology Code available for payment source

## 2020-05-29 ENCOUNTER — Ambulatory Visit
Admission: RE | Admit: 2020-05-29 | Discharge: 2020-05-29 | Disposition: A | Discharge status: Home / Self Care | Payer: No Typology Code available for payment source | Source: Ambulatory Visit | Attending: Internal Medicine | Admitting: Internal Medicine

## 2020-05-29 DIAGNOSIS — J90 Pleural effusion, not elsewhere classified: Secondary | ICD-10-CM

## 2020-07-04 DIAGNOSIS — I2510 Atherosclerotic heart disease of native coronary artery without angina pectoris: Secondary | ICD-10-CM | POA: Diagnosis not present

## 2020-07-04 DIAGNOSIS — Z7984 Long term (current) use of oral hypoglycemic drugs: Secondary | ICD-10-CM | POA: Diagnosis not present

## 2020-07-04 DIAGNOSIS — I10 Essential (primary) hypertension: Secondary | ICD-10-CM | POA: Diagnosis not present

## 2020-07-04 DIAGNOSIS — I509 Heart failure, unspecified: Secondary | ICD-10-CM | POA: Diagnosis not present

## 2020-07-04 DIAGNOSIS — E1165 Type 2 diabetes mellitus with hyperglycemia: Secondary | ICD-10-CM | POA: Diagnosis not present

## 2020-08-08 DIAGNOSIS — I10 Essential (primary) hypertension: Secondary | ICD-10-CM | POA: Diagnosis not present

## 2020-08-08 DIAGNOSIS — I2510 Atherosclerotic heart disease of native coronary artery without angina pectoris: Secondary | ICD-10-CM | POA: Diagnosis not present

## 2020-08-08 DIAGNOSIS — K219 Gastro-esophageal reflux disease without esophagitis: Secondary | ICD-10-CM | POA: Diagnosis not present

## 2020-08-08 DIAGNOSIS — J449 Chronic obstructive pulmonary disease, unspecified: Secondary | ICD-10-CM | POA: Diagnosis not present

## 2020-08-08 DIAGNOSIS — J40 Bronchitis, not specified as acute or chronic: Secondary | ICD-10-CM | POA: Diagnosis not present

## 2020-08-08 DIAGNOSIS — I509 Heart failure, unspecified: Secondary | ICD-10-CM | POA: Diagnosis not present

## 2020-08-08 DIAGNOSIS — E782 Mixed hyperlipidemia: Secondary | ICD-10-CM | POA: Diagnosis not present

## 2020-08-08 DIAGNOSIS — E1165 Type 2 diabetes mellitus with hyperglycemia: Secondary | ICD-10-CM | POA: Diagnosis not present

## 2020-08-08 DIAGNOSIS — E7800 Pure hypercholesterolemia, unspecified: Secondary | ICD-10-CM | POA: Diagnosis not present

## 2020-10-10 DIAGNOSIS — K219 Gastro-esophageal reflux disease without esophagitis: Secondary | ICD-10-CM | POA: Diagnosis not present

## 2020-10-10 DIAGNOSIS — I2510 Atherosclerotic heart disease of native coronary artery without angina pectoris: Secondary | ICD-10-CM | POA: Diagnosis not present

## 2020-10-10 DIAGNOSIS — E1165 Type 2 diabetes mellitus with hyperglycemia: Secondary | ICD-10-CM | POA: Diagnosis not present

## 2020-10-10 DIAGNOSIS — I10 Essential (primary) hypertension: Secondary | ICD-10-CM | POA: Diagnosis not present

## 2020-10-10 DIAGNOSIS — J449 Chronic obstructive pulmonary disease, unspecified: Secondary | ICD-10-CM | POA: Diagnosis not present

## 2020-10-10 DIAGNOSIS — I509 Heart failure, unspecified: Secondary | ICD-10-CM | POA: Diagnosis not present

## 2020-10-10 DIAGNOSIS — M158 Other polyosteoarthritis: Secondary | ICD-10-CM | POA: Diagnosis not present

## 2020-10-10 DIAGNOSIS — J40 Bronchitis, not specified as acute or chronic: Secondary | ICD-10-CM | POA: Diagnosis not present

## 2020-10-10 DIAGNOSIS — E782 Mixed hyperlipidemia: Secondary | ICD-10-CM | POA: Diagnosis not present

## 2020-10-15 DIAGNOSIS — L02419 Cutaneous abscess of limb, unspecified: Secondary | ICD-10-CM | POA: Diagnosis not present

## 2020-11-08 DIAGNOSIS — J449 Chronic obstructive pulmonary disease, unspecified: Secondary | ICD-10-CM | POA: Diagnosis not present

## 2020-11-08 DIAGNOSIS — R0989 Other specified symptoms and signs involving the circulatory and respiratory systems: Secondary | ICD-10-CM | POA: Diagnosis not present

## 2020-11-08 DIAGNOSIS — I509 Heart failure, unspecified: Secondary | ICD-10-CM | POA: Diagnosis not present

## 2020-11-08 DIAGNOSIS — E1165 Type 2 diabetes mellitus with hyperglycemia: Secondary | ICD-10-CM | POA: Diagnosis not present

## 2020-11-08 DIAGNOSIS — E7800 Pure hypercholesterolemia, unspecified: Secondary | ICD-10-CM | POA: Diagnosis not present

## 2020-11-08 DIAGNOSIS — Z7984 Long term (current) use of oral hypoglycemic drugs: Secondary | ICD-10-CM | POA: Diagnosis not present

## 2020-11-08 DIAGNOSIS — I2510 Atherosclerotic heart disease of native coronary artery without angina pectoris: Secondary | ICD-10-CM | POA: Diagnosis not present

## 2020-11-08 DIAGNOSIS — E559 Vitamin D deficiency, unspecified: Secondary | ICD-10-CM | POA: Diagnosis not present

## 2020-11-08 DIAGNOSIS — M158 Other polyosteoarthritis: Secondary | ICD-10-CM | POA: Diagnosis not present

## 2020-11-09 DIAGNOSIS — E7800 Pure hypercholesterolemia, unspecified: Secondary | ICD-10-CM | POA: Diagnosis not present

## 2020-11-09 DIAGNOSIS — K219 Gastro-esophageal reflux disease without esophagitis: Secondary | ICD-10-CM | POA: Diagnosis not present

## 2020-11-09 DIAGNOSIS — E1165 Type 2 diabetes mellitus with hyperglycemia: Secondary | ICD-10-CM | POA: Diagnosis not present

## 2020-11-09 DIAGNOSIS — E782 Mixed hyperlipidemia: Secondary | ICD-10-CM | POA: Diagnosis not present

## 2020-11-09 DIAGNOSIS — J449 Chronic obstructive pulmonary disease, unspecified: Secondary | ICD-10-CM | POA: Diagnosis not present

## 2020-11-09 DIAGNOSIS — M1711 Unilateral primary osteoarthritis, right knee: Secondary | ICD-10-CM | POA: Diagnosis not present

## 2020-11-09 DIAGNOSIS — M158 Other polyosteoarthritis: Secondary | ICD-10-CM | POA: Diagnosis not present

## 2020-11-09 DIAGNOSIS — J40 Bronchitis, not specified as acute or chronic: Secondary | ICD-10-CM | POA: Diagnosis not present

## 2020-11-09 DIAGNOSIS — I2510 Atherosclerotic heart disease of native coronary artery without angina pectoris: Secondary | ICD-10-CM | POA: Diagnosis not present

## 2020-11-09 DIAGNOSIS — I509 Heart failure, unspecified: Secondary | ICD-10-CM | POA: Diagnosis not present

## 2020-11-09 DIAGNOSIS — I10 Essential (primary) hypertension: Secondary | ICD-10-CM | POA: Diagnosis not present

## 2020-12-10 DIAGNOSIS — H401111 Primary open-angle glaucoma, right eye, mild stage: Secondary | ICD-10-CM | POA: Diagnosis not present

## 2020-12-10 DIAGNOSIS — E119 Type 2 diabetes mellitus without complications: Secondary | ICD-10-CM | POA: Diagnosis not present

## 2020-12-10 DIAGNOSIS — Z961 Presence of intraocular lens: Secondary | ICD-10-CM | POA: Diagnosis not present

## 2020-12-10 DIAGNOSIS — H401122 Primary open-angle glaucoma, left eye, moderate stage: Secondary | ICD-10-CM | POA: Diagnosis not present

## 2021-01-11 DIAGNOSIS — I10 Essential (primary) hypertension: Secondary | ICD-10-CM | POA: Diagnosis not present

## 2021-01-11 DIAGNOSIS — K219 Gastro-esophageal reflux disease without esophagitis: Secondary | ICD-10-CM | POA: Diagnosis not present

## 2021-01-11 DIAGNOSIS — M1711 Unilateral primary osteoarthritis, right knee: Secondary | ICD-10-CM | POA: Diagnosis not present

## 2021-01-11 DIAGNOSIS — E782 Mixed hyperlipidemia: Secondary | ICD-10-CM | POA: Diagnosis not present

## 2021-01-11 DIAGNOSIS — J40 Bronchitis, not specified as acute or chronic: Secondary | ICD-10-CM | POA: Diagnosis not present

## 2021-01-11 DIAGNOSIS — I509 Heart failure, unspecified: Secondary | ICD-10-CM | POA: Diagnosis not present

## 2021-01-11 DIAGNOSIS — M158 Other polyosteoarthritis: Secondary | ICD-10-CM | POA: Diagnosis not present

## 2021-01-11 DIAGNOSIS — E1165 Type 2 diabetes mellitus with hyperglycemia: Secondary | ICD-10-CM | POA: Diagnosis not present

## 2021-01-11 DIAGNOSIS — J449 Chronic obstructive pulmonary disease, unspecified: Secondary | ICD-10-CM | POA: Diagnosis not present

## 2021-01-11 DIAGNOSIS — E7800 Pure hypercholesterolemia, unspecified: Secondary | ICD-10-CM | POA: Diagnosis not present

## 2021-01-11 DIAGNOSIS — I2510 Atherosclerotic heart disease of native coronary artery without angina pectoris: Secondary | ICD-10-CM | POA: Diagnosis not present

## 2021-02-12 DIAGNOSIS — I10 Essential (primary) hypertension: Secondary | ICD-10-CM | POA: Diagnosis not present

## 2021-02-12 DIAGNOSIS — K219 Gastro-esophageal reflux disease without esophagitis: Secondary | ICD-10-CM | POA: Diagnosis not present

## 2021-02-12 DIAGNOSIS — E782 Mixed hyperlipidemia: Secondary | ICD-10-CM | POA: Diagnosis not present

## 2021-02-12 DIAGNOSIS — I509 Heart failure, unspecified: Secondary | ICD-10-CM | POA: Diagnosis not present

## 2021-02-12 DIAGNOSIS — J40 Bronchitis, not specified as acute or chronic: Secondary | ICD-10-CM | POA: Diagnosis not present

## 2021-02-12 DIAGNOSIS — J449 Chronic obstructive pulmonary disease, unspecified: Secondary | ICD-10-CM | POA: Diagnosis not present

## 2021-02-12 DIAGNOSIS — E1165 Type 2 diabetes mellitus with hyperglycemia: Secondary | ICD-10-CM | POA: Diagnosis not present

## 2021-02-12 DIAGNOSIS — E7800 Pure hypercholesterolemia, unspecified: Secondary | ICD-10-CM | POA: Diagnosis not present

## 2021-02-12 DIAGNOSIS — M1711 Unilateral primary osteoarthritis, right knee: Secondary | ICD-10-CM | POA: Diagnosis not present

## 2021-02-12 DIAGNOSIS — I2510 Atherosclerotic heart disease of native coronary artery without angina pectoris: Secondary | ICD-10-CM | POA: Diagnosis not present

## 2021-02-12 DIAGNOSIS — M158 Other polyosteoarthritis: Secondary | ICD-10-CM | POA: Diagnosis not present

## 2021-03-25 DIAGNOSIS — H0102A Squamous blepharitis right eye, upper and lower eyelids: Secondary | ICD-10-CM | POA: Diagnosis not present

## 2021-03-25 DIAGNOSIS — E119 Type 2 diabetes mellitus without complications: Secondary | ICD-10-CM | POA: Diagnosis not present

## 2021-03-25 DIAGNOSIS — H0102B Squamous blepharitis left eye, upper and lower eyelids: Secondary | ICD-10-CM | POA: Diagnosis not present

## 2021-03-25 DIAGNOSIS — Z961 Presence of intraocular lens: Secondary | ICD-10-CM | POA: Diagnosis not present

## 2021-03-25 DIAGNOSIS — H401122 Primary open-angle glaucoma, left eye, moderate stage: Secondary | ICD-10-CM | POA: Diagnosis not present

## 2021-03-25 DIAGNOSIS — H401111 Primary open-angle glaucoma, right eye, mild stage: Secondary | ICD-10-CM | POA: Diagnosis not present

## 2021-04-11 DIAGNOSIS — I10 Essential (primary) hypertension: Secondary | ICD-10-CM | POA: Diagnosis not present

## 2021-04-11 DIAGNOSIS — M158 Other polyosteoarthritis: Secondary | ICD-10-CM | POA: Diagnosis not present

## 2021-04-11 DIAGNOSIS — E1165 Type 2 diabetes mellitus with hyperglycemia: Secondary | ICD-10-CM | POA: Diagnosis not present

## 2021-04-11 DIAGNOSIS — J449 Chronic obstructive pulmonary disease, unspecified: Secondary | ICD-10-CM | POA: Diagnosis not present

## 2021-04-11 DIAGNOSIS — E7800 Pure hypercholesterolemia, unspecified: Secondary | ICD-10-CM | POA: Diagnosis not present

## 2021-04-11 DIAGNOSIS — E782 Mixed hyperlipidemia: Secondary | ICD-10-CM | POA: Diagnosis not present

## 2021-04-11 DIAGNOSIS — K219 Gastro-esophageal reflux disease without esophagitis: Secondary | ICD-10-CM | POA: Diagnosis not present

## 2021-04-11 DIAGNOSIS — I2510 Atherosclerotic heart disease of native coronary artery without angina pectoris: Secondary | ICD-10-CM | POA: Diagnosis not present

## 2021-04-11 DIAGNOSIS — J40 Bronchitis, not specified as acute or chronic: Secondary | ICD-10-CM | POA: Diagnosis not present

## 2021-04-25 DIAGNOSIS — L84 Corns and callosities: Secondary | ICD-10-CM | POA: Diagnosis not present

## 2021-05-16 ENCOUNTER — Other Ambulatory Visit: Payer: Self-pay | Admitting: Internal Medicine

## 2021-05-16 ENCOUNTER — Ambulatory Visit
Admission: RE | Admit: 2021-05-16 | Discharge: 2021-05-16 | Disposition: A | Discharge status: Home / Self Care | Payer: No Typology Code available for payment source | Source: Ambulatory Visit | Attending: Internal Medicine | Admitting: Internal Medicine

## 2021-05-16 DIAGNOSIS — R0689 Other abnormalities of breathing: Secondary | ICD-10-CM

## 2021-05-16 DIAGNOSIS — E1165 Type 2 diabetes mellitus with hyperglycemia: Secondary | ICD-10-CM | POA: Diagnosis not present

## 2021-05-16 DIAGNOSIS — J449 Chronic obstructive pulmonary disease, unspecified: Secondary | ICD-10-CM | POA: Diagnosis not present

## 2021-05-16 DIAGNOSIS — E559 Vitamin D deficiency, unspecified: Secondary | ICD-10-CM | POA: Diagnosis not present

## 2021-05-16 DIAGNOSIS — E7800 Pure hypercholesterolemia, unspecified: Secondary | ICD-10-CM | POA: Diagnosis not present

## 2021-05-16 DIAGNOSIS — J984 Other disorders of lung: Secondary | ICD-10-CM | POA: Diagnosis not present

## 2021-05-16 DIAGNOSIS — M48062 Spinal stenosis, lumbar region with neurogenic claudication: Secondary | ICD-10-CM | POA: Diagnosis not present

## 2021-05-16 DIAGNOSIS — M1711 Unilateral primary osteoarthritis, right knee: Secondary | ICD-10-CM | POA: Diagnosis not present

## 2021-05-16 DIAGNOSIS — J90 Pleural effusion, not elsewhere classified: Secondary | ICD-10-CM | POA: Diagnosis not present

## 2021-05-16 DIAGNOSIS — Z7984 Long term (current) use of oral hypoglycemic drugs: Secondary | ICD-10-CM | POA: Diagnosis not present

## 2021-05-16 DIAGNOSIS — Z0000 Encounter for general adult medical examination without abnormal findings: Secondary | ICD-10-CM | POA: Diagnosis not present

## 2021-05-16 DIAGNOSIS — M158 Other polyosteoarthritis: Secondary | ICD-10-CM | POA: Diagnosis not present

## 2021-05-16 DIAGNOSIS — I10 Essential (primary) hypertension: Secondary | ICD-10-CM | POA: Diagnosis not present

## 2021-05-16 DIAGNOSIS — I2510 Atherosclerotic heart disease of native coronary artery without angina pectoris: Secondary | ICD-10-CM | POA: Diagnosis not present

## 2021-05-16 DIAGNOSIS — I509 Heart failure, unspecified: Secondary | ICD-10-CM | POA: Diagnosis not present

## 2021-05-16 DIAGNOSIS — Z1389 Encounter for screening for other disorder: Secondary | ICD-10-CM | POA: Diagnosis not present

## 2021-05-16 DIAGNOSIS — R0989 Other specified symptoms and signs involving the circulatory and respiratory systems: Secondary | ICD-10-CM | POA: Diagnosis not present

## 2021-06-12 DIAGNOSIS — H0102A Squamous blepharitis right eye, upper and lower eyelids: Secondary | ICD-10-CM | POA: Diagnosis not present

## 2021-06-12 DIAGNOSIS — H401122 Primary open-angle glaucoma, left eye, moderate stage: Secondary | ICD-10-CM | POA: Diagnosis not present

## 2021-06-12 DIAGNOSIS — H401111 Primary open-angle glaucoma, right eye, mild stage: Secondary | ICD-10-CM | POA: Diagnosis not present

## 2021-06-12 DIAGNOSIS — E119 Type 2 diabetes mellitus without complications: Secondary | ICD-10-CM | POA: Diagnosis not present

## 2021-06-12 DIAGNOSIS — H0102B Squamous blepharitis left eye, upper and lower eyelids: Secondary | ICD-10-CM | POA: Diagnosis not present

## 2021-06-12 DIAGNOSIS — Z961 Presence of intraocular lens: Secondary | ICD-10-CM | POA: Diagnosis not present

## 2021-07-12 DIAGNOSIS — J449 Chronic obstructive pulmonary disease, unspecified: Secondary | ICD-10-CM | POA: Diagnosis not present

## 2021-07-12 DIAGNOSIS — E1165 Type 2 diabetes mellitus with hyperglycemia: Secondary | ICD-10-CM | POA: Diagnosis not present

## 2021-07-12 DIAGNOSIS — E782 Mixed hyperlipidemia: Secondary | ICD-10-CM | POA: Diagnosis not present

## 2021-07-12 DIAGNOSIS — E7800 Pure hypercholesterolemia, unspecified: Secondary | ICD-10-CM | POA: Diagnosis not present

## 2021-07-12 DIAGNOSIS — J40 Bronchitis, not specified as acute or chronic: Secondary | ICD-10-CM | POA: Diagnosis not present

## 2021-07-12 DIAGNOSIS — K219 Gastro-esophageal reflux disease without esophagitis: Secondary | ICD-10-CM | POA: Diagnosis not present

## 2021-07-12 DIAGNOSIS — I10 Essential (primary) hypertension: Secondary | ICD-10-CM | POA: Diagnosis not present

## 2021-07-19 DIAGNOSIS — M2010 Hallux valgus (acquired), unspecified foot: Secondary | ICD-10-CM | POA: Diagnosis not present

## 2021-07-19 DIAGNOSIS — L84 Corns and callosities: Secondary | ICD-10-CM | POA: Diagnosis not present

## 2021-07-29 ENCOUNTER — Other Ambulatory Visit: Payer: Self-pay

## 2021-07-29 ENCOUNTER — Ambulatory Visit: Payer: Medicare Other

## 2021-07-29 ENCOUNTER — Ambulatory Visit (INDEPENDENT_AMBULATORY_CARE_PROVIDER_SITE_OTHER): Payer: Medicare Other | Admitting: Podiatry

## 2021-07-29 DIAGNOSIS — M21612 Bunion of left foot: Secondary | ICD-10-CM

## 2021-07-29 DIAGNOSIS — B351 Tinea unguium: Secondary | ICD-10-CM

## 2021-07-29 DIAGNOSIS — M2011 Hallux valgus (acquired), right foot: Secondary | ICD-10-CM

## 2021-07-29 DIAGNOSIS — M2012 Hallux valgus (acquired), left foot: Secondary | ICD-10-CM

## 2021-07-29 DIAGNOSIS — M21611 Bunion of right foot: Secondary | ICD-10-CM

## 2021-07-29 DIAGNOSIS — M79674 Pain in right toe(s): Secondary | ICD-10-CM

## 2021-07-29 DIAGNOSIS — E119 Type 2 diabetes mellitus without complications: Secondary | ICD-10-CM

## 2021-07-29 DIAGNOSIS — L97521 Non-pressure chronic ulcer of other part of left foot limited to breakdown of skin: Secondary | ICD-10-CM | POA: Diagnosis not present

## 2021-07-29 DIAGNOSIS — M79675 Pain in left toe(s): Secondary | ICD-10-CM

## 2021-07-29 NOTE — Progress Notes (Signed)
SITUATION Reason for Consult: Evaluation for Prefabricated Diabetic Shoes and Bilateral Custom Diabetic Inserts. Patient / Caregiver Report: Patient would like well fitting shoes  OBJECTIVE DATA: Patient History / Diagnosis:    ICD-10-CM   1. Type 2 diabetes mellitus without complication, without long-term current use of insulin (HCC)  E11.9     2. Hallux valgus with bunions, right  M20.11    M21.611     3. Hallux valgus with bunions, left  M20.12    M21.612       Current or Previous Devices:   None and no history  In-Person Foot Examination: Ulcers & Callousing:   Ulceration on left bunion  Toe / Foot Deformities:   - Pes Planus, - Hallux valgus   Shoe Size: 13XW  ORTHOTIC RECOMMENDATION Recommended Devices: - 1x pair prefabricated PDAC approved diabetic shoes: Patient selects Apex X801M 13XW - 3x pair custom-to-patient vacuum formed diabetic insoles.   GOALS OF SHOES AND INSOLES - Reduce shear and pressure - Reduce / Prevent callus formation - Reduce / Prevent ulceration - Protect the fragile healing compromised diabetic foot.  Patient would benefit from diabetic shoes and inserts as patient has diabetes mellitus and the patient has one or more of the following conditions: - History of partial or complete amputation of the foot - History of previous foot ulceration. - History of pre-ulcerative callus - Peripheral neuropathy with evidence of callus formation - Foot deformity - Poor circulation  ACTIONS PERFORMED Patient was casted for insoles via crush box and measured for shoes via brannock device. Procedure was explained and patient tolerated procedure well. All questions were answered and concerns addressed.  PLAN Patient is to ensure treating physician receives and completes diabetic paperwork. Casts and shoe order are to be held until paperwork is received. Once received patient is to be scheduled for fitting in four weeks.

## 2021-07-29 NOTE — Progress Notes (Signed)
°  Subjective:  Patient ID: ISABELLA IDA, male    DOB: 1931-06-30,  MRN: [id number]  Chief Complaint  Patient presents with   Callouses   Bunions    88 y.o. male presents with the above complaint. History confirmed with patient.  He has bunions on both feet but the left side is painful it hurts if he does not put a bandage on it.  The skin has been breaking down.  Toenails are very thick and elongated he has difficulty cutting them.  Says that his blood sugar has been good he usually runs between 101 20 in the mornings does not know his last A1c.  Objective:  Physical Exam: warm, good capillary refill, bunions, no trophic changes or ulcerative lesions, normal DP and PT pulses, normal monofilament exam, normal sensory exam, and partial-thickness skin breakdown over bunion left foot, no signs of infection does not penetrate past epidermis. Left Foot: dystrophic yellowed discolored nail plates with subungual debris Right Foot: dystrophic yellowed discolored nail plates with subungual debris  Assessment:   1. Hallux valgus with bunions, right   2. Hallux valgus with bunions, left   3. Pain due to onychomycosis of toenails of both feet   4. Type 2 diabetes mellitus without complication, without long-term current use of insulin ([place])   5. Ulcer of left foot, limited to breakdown of skin [facility])      Plan:  Patient was evaluated and treated and all questions answered.  Patient educated on diabetes. Discussed proper diabetic foot care and discussed risks and complications of disease. Educated patient in depth on reasons to return to the office immediately should he/she discover anything concerning or new on the feet. All questions answered. Discussed proper shoes as well.   Discussed the etiology and treatment options for the condition in detail with the patient. Educated patient on the topical and oral treatment options for mycotic nails. Recommended debridement of the nails today. Sharp  and mechanical debridement performed of all painful and mycotic nails today. Nails debrided in length and thickness using a nail nipper to level of comfort. Discussed treatment options including appropriate shoe gear. Follow up as needed for painful nails.  Discussed etiology and treatment options for bunion deformity in detail.  I discussed with him that his age I would not recommend surgical intervention.  I recommended wider shoe gear.  I think he likely would benefit from extra-depth and wider diabetic shoes with a multidensity insert to prevent risk of ulceration.  He was seen by the pedorthist today and fitted for these.  Return in about 3 months (around 10/26/2021) for at risk diabetic foot care.

## 2021-08-22 ENCOUNTER — Telehealth: Payer: Self-pay

## 2021-08-22 NOTE — Telephone Encounter (Signed)
Casts Sent to [employer] - HOLD for CMN °

## 2021-08-23 DIAGNOSIS — M2010 Hallux valgus (acquired), unspecified foot: Secondary | ICD-10-CM | POA: Diagnosis not present

## 2021-08-23 DIAGNOSIS — L84 Corns and callosities: Secondary | ICD-10-CM | POA: Diagnosis not present

## 2021-08-23 DIAGNOSIS — I10 Essential (primary) hypertension: Secondary | ICD-10-CM | POA: Diagnosis not present

## 2021-09-11 ENCOUNTER — Ambulatory Visit
Admission: RE | Admit: 2021-09-11 | Discharge: 2021-09-11 | Disposition: A | Discharge status: Home / Self Care | Payer: No Typology Code available for payment source | Source: Ambulatory Visit | Attending: Physician Assistant | Admitting: Physician Assistant

## 2021-09-11 ENCOUNTER — Other Ambulatory Visit: Payer: Self-pay | Admitting: Physician Assistant

## 2021-09-11 DIAGNOSIS — M19071 Primary osteoarthritis, right ankle and foot: Secondary | ICD-10-CM | POA: Diagnosis not present

## 2021-09-11 DIAGNOSIS — L03116 Cellulitis of left lower limb: Secondary | ICD-10-CM | POA: Diagnosis not present

## 2021-09-11 DIAGNOSIS — L03115 Cellulitis of right lower limb: Secondary | ICD-10-CM

## 2021-09-11 DIAGNOSIS — R262 Difficulty in walking, not elsewhere classified: Secondary | ICD-10-CM | POA: Diagnosis not present

## 2021-09-11 DIAGNOSIS — M19072 Primary osteoarthritis, left ankle and foot: Secondary | ICD-10-CM | POA: Diagnosis not present

## 2021-09-11 DIAGNOSIS — I739 Peripheral vascular disease, unspecified: Secondary | ICD-10-CM | POA: Diagnosis not present

## 2021-09-11 DIAGNOSIS — E1165 Type 2 diabetes mellitus with hyperglycemia: Secondary | ICD-10-CM | POA: Diagnosis not present

## 2021-09-11 DIAGNOSIS — M7731 Calcaneal spur, right foot: Secondary | ICD-10-CM | POA: Diagnosis not present

## 2021-09-11 DIAGNOSIS — Z7984 Long term (current) use of oral hypoglycemic drugs: Secondary | ICD-10-CM | POA: Diagnosis not present

## 2021-09-11 DIAGNOSIS — M2011 Hallux valgus (acquired), right foot: Secondary | ICD-10-CM | POA: Diagnosis not present

## 2021-09-12 ENCOUNTER — Other Ambulatory Visit: Payer: Self-pay | Admitting: Sports Medicine

## 2021-09-12 ENCOUNTER — Ambulatory Visit (INDEPENDENT_AMBULATORY_CARE_PROVIDER_SITE_OTHER): Payer: Medicare Other | Admitting: Sports Medicine

## 2021-09-12 ENCOUNTER — Telehealth: Payer: Self-pay | Admitting: *Deleted

## 2021-09-12 DIAGNOSIS — M2041 Other hammer toe(s) (acquired), right foot: Secondary | ICD-10-CM

## 2021-09-12 DIAGNOSIS — M2042 Other hammer toe(s) (acquired), left foot: Secondary | ICD-10-CM

## 2021-09-12 DIAGNOSIS — E119 Type 2 diabetes mellitus without complications: Secondary | ICD-10-CM | POA: Diagnosis not present

## 2021-09-12 DIAGNOSIS — M21611 Bunion of right foot: Secondary | ICD-10-CM | POA: Diagnosis not present

## 2021-09-12 DIAGNOSIS — M21612 Bunion of left foot: Secondary | ICD-10-CM | POA: Diagnosis not present

## 2021-09-12 DIAGNOSIS — M2011 Hallux valgus (acquired), right foot: Secondary | ICD-10-CM

## 2021-09-12 DIAGNOSIS — M2012 Hallux valgus (acquired), left foot: Secondary | ICD-10-CM | POA: Diagnosis not present

## 2021-09-12 DIAGNOSIS — L97511 Non-pressure chronic ulcer of other part of right foot limited to breakdown of skin: Secondary | ICD-10-CM

## 2021-09-12 DIAGNOSIS — L97521 Non-pressure chronic ulcer of other part of left foot limited to breakdown of skin: Secondary | ICD-10-CM | POA: Diagnosis not present

## 2021-09-12 MED ORDER — AMOXICILLIN-POT CLAVULANATE 875-125 MG PO TABS: 1.0000 | ORAL_TABLET | Freq: Two times a day (BID) | ORAL | 0 refills | Status: DC

## 2021-09-12 MED ORDER — TRAMADOL HCL 50 MG PO TABS: 50.0000 mg | ORAL_TABLET | Freq: Four times a day (QID) | ORAL | 0 refills | Status: AC | PRN

## 2021-09-12 NOTE — Telephone Encounter (Signed)
Patient has been notified

## 2021-09-12 NOTE — Telephone Encounter (Signed)
Patient's daughter is calling for pain medication, does not have any at home. Please send to pharmacy on file. ?

## 2021-09-12 NOTE — Patient Instructions (Signed)
Call office if wounds worsen or if you have any questions about your wound care. ?[phone] ?Dr. Cannon Kettle ?

## 2021-09-12 NOTE — Progress Notes (Signed)
Tramadol sent to patient pharmacy for pain ?

## 2021-09-12 NOTE — Progress Notes (Signed)
Subjective: ?Harold [name] is a 88 y.o. male patient seen in office for evaluation of ulceration of the left and right foot.  Patient has a history of diabetes and a blood glucose level today not recorded but patient reports that sometimes it does go up depending on what he does or eats.  Patient is changing the dressing using Neosporin and clean the areas with peroxide on his own states that he went to his PCP on yesterday who did x-rays which were negative and referred him here for further care.  Patient reports that there is consistent pain to these wounds and sometimes at night he even rubs on a pain cream to try to help.  Patient is assisted by daughter who also helps to report history since patient is hard of hearing and does not want to wear his hearing aids. ? ?Patient Active Problem List  ? Diagnosis Date Noted  ? Preop cardiovascular exam 03/01/2013  ? Cholecystitis, acute 02/28/2013  ? CAD (coronary artery disease) of artery bypass graft 02/28/2013  ? Diabetes ([place]) 02/28/2013  ? Dyslipidemia 02/28/2013  ? Hypertension 02/28/2013  ? ?Current Outpatient Medications on File Prior to Visit  ?Medication Sig Dispense Refill  ? aspirin EC 325 MG tablet Take 162.5 mg by mouth daily.    ? lisinopril (PRINIVIL,ZESTRIL) 5 MG tablet Take 5 mg by mouth daily.     ? metFORMIN (GLUCOPHAGE) 500 MG tablet Take 250-500 mg by mouth 2 (two) times daily with a meal. 1/2 tablet (250 mg) if cbg under 110, 1 tablet (500 mg) if over 110    ? omeprazole (PRILOSEC) 40 MG capsule Take 40 mg by mouth daily.     ? simvastatin (ZOCOR) 80 MG tablet Take 40 mg by mouth daily.    ? traMADol (ULTRAM) 50 MG tablet Take 50 mg by mouth every 6 (six) hours as needed for pain.     ? ?No current facility-administered medications on file prior to visit.  ? ?No Known Allergies ? ?No results found for this or any previous visit (from the past 2160 hour(s)). ? ?Objective: ?There were no vitals filed for this visit. ? ?General: Patient is  awake, alert, oriented x 3 and in no acute distress. ? ?Dermatology: Skin is warm and dry bilateral with a partial thickness ulceration present medial first MPJ on the left and right medial second toe at the first webspace.  Ulcerations measures 2 x 1 cm on the left and a 0.5 x 0.5 cm on the right. There is a macerated border with a fibrogranular base. The ulcerations do notprobe to bone. There is no malodor, clear to yellow active drainage noted on the left with localized edema and erythema at the first metatarsal phalangeal joint on the left. ? ?Vascular: Dorsalis Pedis pulse = 0/4 Bilateral,  Posterior Tibial pulse = 0/4 Bilateral,  Capillary Fill Time < 5 seconds ? ?Neurologic: Protective diminished bilateral however patient is noted to be hypersensitive at the affected wound areas. ? ?Musculosketal: There is pain to palpation to the wound areas bilateral.  There is significant bunion and hammertoe deformity noted bilateral. ? ?Xrays, Left/Right foot: Bunion and hammertoe deformity.  No bony destruction suggestive of osteomyelitis at ulcerated areas. No gas in soft tissues.  ? ?No results for input(s): GRAMSTAIN, LABORGA in the last 8760 hours. ? ?Assessment and Plan:  ?Problem List Items Addressed This Visit   ? ?  ? Endocrine  ? Diabetes ([place])  ? Relevant Orders  ? Ambulatory referral  to Vascular Surgery  ? VAS [place] ABI WITH/WO TBI  ? VAS [place] PAD ABI  ? ?Other Visit Diagnoses   ? ? Ulcer of left foot, limited to breakdown of skin ([place])    -  Primary  ? Relevant Orders  ? WOUND CULTURE  ? Ambulatory referral to Vascular Surgery  ? VAS [place] ABI WITH/WO TBI  ? VAS [place] PAD ABI  ? Hallux valgus with bunions, right      ? Hallux valgus with bunions, left      ? Hammer toes of both feet      ? Toe ulcer, right, limited to breakdown of skin ([place])      ? Relevant Orders  ? Ambulatory referral to Vascular Surgery  ? VAS [place] ABI WITH/WO TBI  ? VAS [place] PAD ABI  ? ?  ? ? ? ?-Examined patient and discussed the progression of the  wound and treatment alternatives. ?-Xrays reviewed as obtained by PCP on yesterday that are negative for any acute osteomyelitis ?-Cleanse ulcerations very gently using wound cleanser no debridement performed at this visit due to uncertain vascular status of patient; vascular studies ordered ?-Wound culture obtained from the left first MPJ ulcer and advised patient we will call him if there is any additional antibiotics that are needed at this time did start patient preemptively on Augmentin ?-Applied Iodosorb and gauze and Band-Aid dressings and advised patient to do the same daily which morning  ?-Advised patient to take tramadol as prescribed by PCP for pain ?- Advised patient to go to the ER or return to office if the wound worsens or if constitutional symptoms are present. ?-Patient to return to office in 2 weeks for follow up wound care and evaluation or sooner if problems arise. ? ?Landis Martins, DPM ?

## 2021-09-13 ENCOUNTER — Telehealth: Payer: Self-pay

## 2021-09-13 NOTE — Telephone Encounter (Signed)
Shoes Ordered - [employer] [place] [id number] ?

## 2021-09-17 ENCOUNTER — Telehealth: Payer: Self-pay | Admitting: *Deleted

## 2021-09-17 ENCOUNTER — Other Ambulatory Visit: Payer: Self-pay | Admitting: Sports Medicine

## 2021-09-17 MED ORDER — DOXYCYCLINE HYCLATE 100 MG PO TABS: 100.0000 mg | ORAL_TABLET | Freq: Two times a day (BID) | ORAL | 0 refills | Status: DC

## 2021-09-17 NOTE — Telephone Encounter (Signed)
Patient has been notified, verbalized understanding.

## 2021-09-17 NOTE — Telephone Encounter (Signed)
Patient's daughter is calling because the antibiotic prescribed is making patient sick. He has taken keflex before  from prior PCP and that makes him sick as well. Can something else be prescribed? Please advise. ?

## 2021-09-17 NOTE — Progress Notes (Signed)
Changed Augmentin to Doxycycline ?-Dr. Chauncey Cruel ?

## 2021-09-19 ENCOUNTER — Ambulatory Visit (HOSPITAL_COMMUNITY)
Admission: RE | Admit: 2021-09-19 | Discharge: 2021-09-19 | Disposition: A | Discharge status: Home / Self Care | Payer: Medicare Other | Source: Ambulatory Visit | Attending: Internal Medicine | Admitting: Internal Medicine

## 2021-09-19 DIAGNOSIS — K219 Gastro-esophageal reflux disease without esophagitis: Secondary | ICD-10-CM | POA: Diagnosis not present

## 2021-09-19 DIAGNOSIS — L97511 Non-pressure chronic ulcer of other part of right foot limited to breakdown of skin: Secondary | ICD-10-CM | POA: Diagnosis not present

## 2021-09-19 DIAGNOSIS — M158 Other polyosteoarthritis: Secondary | ICD-10-CM | POA: Diagnosis not present

## 2021-09-19 DIAGNOSIS — E1165 Type 2 diabetes mellitus with hyperglycemia: Secondary | ICD-10-CM | POA: Diagnosis not present

## 2021-09-19 DIAGNOSIS — E782 Mixed hyperlipidemia: Secondary | ICD-10-CM | POA: Diagnosis not present

## 2021-09-19 DIAGNOSIS — L97521 Non-pressure chronic ulcer of other part of left foot limited to breakdown of skin: Secondary | ICD-10-CM | POA: Diagnosis not present

## 2021-09-19 DIAGNOSIS — E119 Type 2 diabetes mellitus without complications: Secondary | ICD-10-CM | POA: Diagnosis not present

## 2021-09-19 DIAGNOSIS — I10 Essential (primary) hypertension: Secondary | ICD-10-CM | POA: Diagnosis not present

## 2021-09-26 ENCOUNTER — Ambulatory Visit (INDEPENDENT_AMBULATORY_CARE_PROVIDER_SITE_OTHER): Payer: Medicare Other | Admitting: Sports Medicine

## 2021-09-26 ENCOUNTER — Encounter: Payer: Self-pay | Admitting: Sports Medicine

## 2021-09-26 DIAGNOSIS — L97511 Non-pressure chronic ulcer of other part of right foot limited to breakdown of skin: Secondary | ICD-10-CM | POA: Diagnosis not present

## 2021-09-26 DIAGNOSIS — M2042 Other hammer toe(s) (acquired), left foot: Secondary | ICD-10-CM

## 2021-09-26 DIAGNOSIS — M21611 Bunion of right foot: Secondary | ICD-10-CM

## 2021-09-26 DIAGNOSIS — E119 Type 2 diabetes mellitus without complications: Secondary | ICD-10-CM

## 2021-09-26 DIAGNOSIS — L97521 Non-pressure chronic ulcer of other part of left foot limited to breakdown of skin: Secondary | ICD-10-CM

## 2021-09-26 DIAGNOSIS — M2041 Other hammer toe(s) (acquired), right foot: Secondary | ICD-10-CM

## 2021-09-26 DIAGNOSIS — M2011 Hallux valgus (acquired), right foot: Secondary | ICD-10-CM

## 2021-09-26 NOTE — Patient Instructions (Signed)
[facility] ?Get online care: .[email or web address] ?Address: [address] [place], [place] [address] ?Phone: [phone] ?

## 2021-09-26 NOTE — Progress Notes (Signed)
Subjective: ?HARSH [name] is a 88 y.o. male patient seen in office for evaluation of ulceration of the left and right foot.  Patient reports that the Iodosorb made his feet burn and states that the pain is 9 out of 10 to the wounds worse with shoes and walking.  Reports that he is tolerating the doxycycline better than the other antibiotic. ? ?Patient Active Problem List  ? Diagnosis Date Noted  ? Preop cardiovascular exam 03/01/2013  ? Cholecystitis, acute 02/28/2013  ? CAD (coronary artery disease) of artery bypass graft 02/28/2013  ? Diabetes ([place]) 02/28/2013  ? Dyslipidemia 02/28/2013  ? Hypertension 02/28/2013  ? ?Current Outpatient Medications on File Prior to Visit  ?Medication Sig Dispense Refill  ? lidocaine (XYLOCAINE) 5 % ointment 1 application as needed to painful area    ? ACCU-CHEK AVIVA PLUS test strip     ? Accu-Chek Softclix Lancets lancets SMARTSIG:Topical    ? amLODipine (NORVASC) 5 MG tablet Take 5 mg by mouth daily.    ? amoxicillin-clavulanate (AUGMENTIN) 875-125 MG tablet Take 1 tablet by mouth 2 (two) times daily. 28 tablet 0  ? aspirin EC 325 MG tablet Take 162.5 mg by mouth daily.    ? cephALEXin (KEFLEX) 500 MG capsule 1 capsule    ? dorzolamide-timolol (COSOPT) 22.3-6.8 MG/ML ophthalmic solution 1 drop 2 (two) times daily.    ? doxycycline (VIBRA-TABS) 100 MG tablet Take 1 tablet (100 mg total) by mouth 2 (two) times daily. 20 tablet 0  ? gabapentin (NEURONTIN) 100 MG capsule Take 100 mg by mouth 3 (three) times daily.    ? latanoprost (XALATAN) 0.005 % ophthalmic solution 1 drop at bedtime.    ? lidocaine (XYLOCAINE) 2 % jelly Apply 1 application. topically 3 (three) times daily as needed.    ? lisinopril (PRINIVIL,ZESTRIL) 5 MG tablet Take 5 mg by mouth daily.     ? lisinopril-hydrochlorothiazide (ZESTORETIC) 20-25 MG tablet     ? metFORMIN (GLUCOPHAGE) 500 MG tablet Take 250-500 mg by mouth 2 (two) times daily with a meal. 1/2 tablet (250 mg) if cbg under 110, 1 tablet (500 mg)  if over 110    ? omeprazole (PRILOSEC) 40 MG capsule Take 40 mg by mouth daily.     ? pantoprazole (PROTONIX) 40 MG tablet Take 40 mg by mouth daily.    ? simvastatin (ZOCOR) 80 MG tablet Take 40 mg by mouth daily.    ? ?No current facility-administered medications on file prior to visit.  ? ?No Known Allergies ? ?No results found for this or any previous visit (from the past 2160 hour(s)). ? ?Objective: ?There were no vitals filed for this visit. ? ?General: Patient is awake, alert, oriented x 3 and in no acute distress. ? ?Dermatology: Skin is warm and dry bilateral with a partial thickness ulceration present medial first MPJ on the left and right medial second toe at the first webspace.  Ulcerations measures 2 x 1 cm on the left unchanged from prior with more eschar to the base and a 0.2 x 0.2 cm on the right. There is a macerated border with a fibrogranular base. The ulcerations do not probe to bone. There is no malodor, clear to yellow active drainage noted on the left with localized edema and erythema at the first metatarsal phalangeal joint on the left which appears to be slightly improving. ? ?Vascular: Dorsalis Pedis pulse = 0/4 Bilateral,  Posterior Tibial pulse = 0/4 Bilateral,  Capillary Fill Time < 5 seconds ? ?  Neurologic: Protective diminished bilateral however patient is noted to be hypersensitive at the affected wound areas. ? ?Musculosketal: There is pain to palpation to the wound areas bilateral.  There is significant bunion and hammertoe deformity noted bilateral. ? ? ?No results for input(s): GRAMSTAIN, LABORGA in the last 8760 hours. ? ?Assessment and Plan:  ?Problem List Items Addressed This Visit   ? ?  ? Endocrine  ? Diabetes ([place])  ? Relevant Medications  ? lisinopril-hydrochlorothiazide (ZESTORETIC) 20-25 MG tablet  ? ?Other Visit Diagnoses   ? ? Ulcer of left foot, limited to breakdown of skin ([place])    -  Primary  ? Toe ulcer, right, limited to breakdown of skin ([place])      ? Hallux valgus  with bunions, right      ? Hammer toes of both feet      ? ?  ? ? ? ?-Re-Examined patient and discussed the progression of the wound and treatment alternatives.  Plan of care discussed with daughter who is present at today's visit. ?-Cleanse ulcerations very gently using wound cleanser no debridement on the right due to uncertain vascular status however lightly debrided with a tissue nipper loose skin at the right second toe to patient's tolerance using a sterile tissue nipper patient tolerated light debridement well without complication ?-Applied Medihoney and gauze and Band-Aid dressings and advised patient to do the same daily ?-Continue with tramadol may add additional Tylenol to help with pain if ineffective to call office for change in medication ?-Continue with doxycycline until completed ?-Continue with vascular follow-up referral placed patient awaiting to be scheduled to be seen by vascular surgery for abnormal ABIs ?- Advised patient to go to the ER or return to office if the wound worsens or if constitutional symptoms are present. ?-Patient to return to office in 2 weeks for follow up wound care and evaluation or sooner if problems arise.  Advised patient if wound is not better we will refer him to wound center. ? ?Landis Martins, DPM ?

## 2021-10-07 NOTE — Progress Notes (Signed)
[facility] ? ?ASSESSMENT / PLAN: ?Harold [name] is a 88 y.o. male with atherosclerosis of native arteries of left lower extremity causing ulceration. ? ? patients with chronic limb threatening ischemia have an annual risk of cardiovascular mortality of 25% and a high risk of amputation.  ? ?WIfI score 2 / 1 / 0. Clinical stage III. Moderate risk of amputation. Moderate potential benefit from revascularization. ? ?Recommend the following which can slow the progression of atherosclerosis and reduce the risk of major adverse cardiac / limb events:  ?Complete cessation from all tobacco products. ?Blood glucose control with goal A1c < 7%. ?Blood pressure control with goal blood pressure < 140/90 mmHg. ?Lipid reduction therapy with goal LDL-C <100 mg/dL (<70 if symptomatic from PAD).  ?Aspirin '81mg'$  PO QD.  ?Atorvastatin 40-'80mg'$  PO QD (or other "high intensity" statin therapy). ? ?Plan left lower extremity angiogram with possible intervention via right common femoral approach in cath lab 10/11/21.  ? ?CHIEF COMPLAINT: left foot ulceration ? ?HISTORY OF PRESENT ILLNESS: ?Harold [name] is a 88 y.o. male who presents to clinic for evaluation of left foot ulcer.  This is been present for many weeks.  He has been under the care of Dr. Cannon Kettle, has been doing meticulous wound care.  An ankle-brachial index was ordered, and suggestive of peripheral arterial disease.  He was referred for further evaluation.  The patient is a spry, and energetic 88 year old looking much younger than his stated age.  He is reportedly quite active per his daughter who is in the room with him.  His daughter is a Marine scientist in the neonatal unit at [employer]. ? ?Past Medical History:  ?Diagnosis Date  ? Arthritis   ? "generalized" (03/01/2013)  ? CAD (coronary artery disease)   ? Cataract   ? GERD (gastroesophageal reflux disease)   ? Hyperlipidemia   ? Myocardial infarction [facility]) 2004  ? Type II diabetes mellitus ([place])    ? Unspecified essential hypertension   ? ? ?Past Surgical History:  ?Procedure Laterality Date  ? BACK SURGERY  1970s  ? CATARACT EXTRACTION W/ INTRAOCULAR LENS IMPLANT Left 2013  ? CHOLECYSTECTOMY N/A 03/02/2013  ? Procedure: LAPAROSCOPIC CHOLECYSTECTOMY;  Surgeon: Rolm Bookbinder, MD;  Location: [facility];  Service: General;  Laterality: N/A;  ? CORONARY ANGIOPLASTY WITH STENT PLACEMENT    ? "had 2-3; they clogged; he later had OHS" (03/01/2013)  ? CORONARY ARTERY BYPASS GRAFT  02/2003  ? "triple" (03/01/2013)  ? ? ?Family History  ?Problem Relation Age of Onset  ? Heart disease Mother   ? Diabetes Mother   ? ? ?Social History  ? ?Socioeconomic History  ? Marital status: Single  ?  Spouse name: Not on file  ? Number of children: Not on file  ? Years of education: Not on file  ? Highest education level: Not on file  ?Occupational History  ? Not on file  ?Tobacco Use  ? Smoking status: Some Days  ?  Years: 30.00  ?  Types: Cigarettes  ? Smokeless tobacco: Never  ?Substance and Sexual Activity  ? Alcohol use: Yes  ?  Comment: 03/01/2013 "drank years ago; never had problem w/it"  ? Drug use: No  ? Sexual activity: Never  ?Other Topics Concern  ? Not on file  ?Social History Narrative  ? Not on file  ? ?Social Determinants of Health  ? ?Financial Resource Strain: Not on file  ?Food Insecurity: Not on file  ?Transportation Needs: Not on  file  ?Physical Activity: Not on file  ?Stress: Not on file  ?Social Connections: Not on file  ?Intimate Partner Violence: Not on file  ? ? ?No Known Allergies ? ?Current Outpatient Medications  ?Medication Sig Dispense Refill  ? ACCU-CHEK AVIVA PLUS test strip     ? Accu-Chek Softclix Lancets lancets SMARTSIG:Topical    ? amLODipine (NORVASC) 5 MG tablet Take 5 mg by mouth daily.    ? aspirin EC 325 MG tablet Take 162.5 mg by mouth daily.    ? cephALEXin (KEFLEX) 500 MG capsule 1 capsule    ? dorzolamide-timolol (COSOPT) 22.3-6.8 MG/ML ophthalmic solution 1 drop 2 (two) times daily.    ?  doxycycline (VIBRA-TABS) 100 MG tablet Take 1 tablet (100 mg total) by mouth 2 (two) times daily. 20 tablet 0  ? gabapentin (NEURONTIN) 100 MG capsule Take 100 mg by mouth 3 (three) times daily.    ? latanoprost (XALATAN) 0.005 % ophthalmic solution 1 drop at bedtime.    ? lidocaine (XYLOCAINE) 2 % jelly Apply 1 application. topically 3 (three) times daily as needed.    ? lidocaine (XYLOCAINE) 5 % ointment 1 application as needed to painful area    ? lisinopril (PRINIVIL,ZESTRIL) 5 MG tablet Take 5 mg by mouth daily.     ? lisinopril-hydrochlorothiazide (ZESTORETIC) 20-25 MG tablet     ? metFORMIN (GLUCOPHAGE) 500 MG tablet Take 250-500 mg by mouth 2 (two) times daily with a meal. 1/2 tablet (250 mg) if cbg under 110, 1 tablet (500 mg) if over 110    ? omeprazole (PRILOSEC) 40 MG capsule Take 40 mg by mouth daily.     ? pantoprazole (PROTONIX) 40 MG tablet Take 40 mg by mouth daily.    ? simvastatin (ZOCOR) 80 MG tablet Take 40 mg by mouth daily.    ? amoxicillin-clavulanate (AUGMENTIN) 875-125 MG tablet Take 1 tablet by mouth 2 (two) times daily. (Patient not taking: Reported on 10/08/2021) 28 tablet 0  ? ?No current facility-administered medications for this visit.  ? ? ?PHYSICAL EXAM ?Vitals:  ? 10/08/21 1117  ?BP: (!) 144/66  ?Pulse: 66  ?Resp: 20  ?Temp: 97.9 ?F (36.6 ?C)  ?SpO2: 100%  ?Weight: 142 lb (64.4 kg)  ?Height: '5\' 10"'$  (1.778 m)  ? ? ?Constitutional: well appearing elderly gentleman. no distress. Appears well nourished.  ?Cardiac: regular rate and rhythm.  ?Respiratory:  unlabored. ?Abdominal:  soft, non-tender, non-distended.  ?Peripheral vascular: no pedal pulses. Quarter sized ischemic ulcer over the right first MTP joint.  ? ?PERTINENT LABORATORY AND RADIOLOGIC DATA ? ?Most recent CBC ? ?  Latest Ref Rng & Units 03/03/2013  ?  4:47 AM 03/01/2013  ?  6:10 AM 02/28/2013  ?  3:12 PM  ?CBC  ?WBC 4.0 - 10.5 K/uL 9.1   12.5   13.0    ?Hemoglobin 13.0 - 17.0 g/dL 13.6   13.4   15.2    ?Hematocrit 39.0 - 52.0  % 38.7   38.2   43.0    ?Platelets 150 - 400 K/uL 188   157   170    ?  ? ?Most recent CMP ? ?  Latest Ref Rng & Units 03/03/2013  ?  4:47 AM 03/01/2013  ?  6:10 AM 02/28/2013  ?  3:12 PM  ?CMP  ?Glucose 70 - 99 mg/dL 134   82   130    ?BUN 6 - 23 mg/dL '17   18   25    '$ ?Creatinine  0.50 - 1.35 mg/dL 0.88   0.95   1.06    ?Sodium 135 - 145 mEq/L 140   134   132    ?Potassium 3.5 - 5.1 mEq/L 4.2   4.5   4.7    ?Chloride 96 - 112 mEq/L 107   102   95    ?CO2 19 - 32 mEq/L '23   20   25    '$ ?Calcium 8.4 - 10.5 mg/dL 8.6   8.9   10.1    ?Total Protein 6.0 - 8.3 g/dL  6.5   8.0    ?Total Bilirubin 0.3 - 1.2 mg/dL  1.0   1.0    ?Alkaline Phos 39 - 117 U/L  66   74    ?AST 0 - 37 U/L  18   19    ?ALT 0 - 53 U/L  12   15    ? ?Vascular Imaging: ? ?+-------+----------------+-----------+------------+------------+  ?ABI/TBIToday's ABI     Today's TBIPrevious ABIPrevious TBI  ?+-------+----------------+-----------+------------+------------+  ?Right  non-compressible.53                                  ?+-------+----------------+-----------+------------+------------+  ?Left   non-compressible.30                                  ?+-------+----------------+-----------+------------+------------+  ? ?Yevonne Aline. Stanford Breed, MD ?[facility] ?Office Phone Number: [phone] ?10/08/2021 5:23 PM ? ?Total time spent on preparing this encounter including chart review, data review, collecting history, examining the patient, coordinating care for this new patient, 60 minutes. ? ?Portions of this report may have been transcribed using voice recognition software.  Every effort has been made to ensure accuracy; however, inadvertent computerized transcription errors may still be present. ? ? ? ?

## 2021-10-07 NOTE — H&P (View-Only) (Signed)
[facility] [place] ? ?ASSESSMENT / PLAN: ?[name] is a 88 y.o. male with atherosclerosis of native arteries of left lower extremity causing ulceration. ? ? patients with chronic limb threatening ischemia have an annual risk of cardiovascular mortality of 25% and a high risk of amputation.  ? ?WIfI score 2 / 1 / 0. Clinical stage III. Moderate risk of amputation. Moderate potential benefit from revascularization. ? ?Recommend the following which can slow the progression of atherosclerosis and reduce the risk of major adverse cardiac / limb events:  ?Complete cessation from all tobacco products. ?Blood glucose control with goal A1c < 7%. ?Blood pressure control with goal blood pressure < 140/90 mmHg. ?Lipid reduction therapy with goal LDL-C <100 mg/dL (<70 if symptomatic from PAD).  ?Aspirin '81mg'$  PO QD.  ?Atorvastatin 40-'80mg'$  PO QD (or other "high intensity" statin therapy). ? ?Plan left lower extremity angiogram with possible intervention via right common femoral approach in cath lab 10/11/21.  ? ?CHIEF COMPLAINT: left foot ulceration ? ?HISTORY OF PRESENT ILLNESS: ?Harold [name] is a 88 y.o. male who presents to clinic for evaluation of left foot ulcer.  This is been present for many weeks.  He has been under the care of Dr. Cannon Kettle, has been doing meticulous wound care.  An ankle-brachial index was ordered, and suggestive of peripheral arterial disease.  He was referred for further evaluation.  The patient is a spry, and energetic 88 year old looking much younger than his stated age.  He is reportedly quite active per his daughter who is in the room with him.  His daughter is a Marine scientist in the neonatal unit at [employer]. ? ?Past Medical History:  ?Diagnosis Date  ? Arthritis   ? "generalized" (03/01/2013)  ? CAD (coronary artery disease)   ? Cataract   ? GERD (gastroesophageal reflux disease)   ? Hyperlipidemia   ? Myocardial infarction [facility]) 2004  ? Type II diabetes mellitus ([place])    ? Unspecified essential hypertension   ? ? ?Past Surgical History:  ?Procedure Laterality Date  ? BACK SURGERY  1970s  ? CATARACT EXTRACTION W/ INTRAOCULAR LENS IMPLANT Left 2013  ? CHOLECYSTECTOMY N/A 03/02/2013  ? Procedure: LAPAROSCOPIC CHOLECYSTECTOMY;  Surgeon: Rolm Bookbinder, MD;  Location: [facility];  Service: General;  Laterality: N/A;  ? CORONARY ANGIOPLASTY WITH STENT PLACEMENT    ? "had 2-3; they clogged; he later had OHS" (03/01/2013)  ? CORONARY ARTERY BYPASS GRAFT  02/2003  ? "triple" (03/01/2013)  ? ? ?Family History  ?Problem Relation Age of Onset  ? Heart disease Mother   ? Diabetes Mother   ? ? ?Social History  ? ?Socioeconomic History  ? Marital status: Single  ?  Spouse name: Not on file  ? Number of children: Not on file  ? Years of education: Not on file  ? Highest education level: Not on file  ?Occupational History  ? Not on file  ?Tobacco Use  ? Smoking status: Some Days  ?  Years: 30.00  ?  Types: Cigarettes  ? Smokeless tobacco: Never  ?Substance and Sexual Activity  ? Alcohol use: Yes  ?  Comment: 03/01/2013 "drank years ago; never had problem w/it"  ? Drug use: No  ? Sexual activity: Never  ?Other Topics Concern  ? Not on file  ?Social History Narrative  ? Not on file  ? ?Social Determinants of Health  ? ?Financial Resource Strain: Not on file  ?Food Insecurity: Not on file  ?Transportation Needs: Not on  file  ?Physical Activity: Not on file  ?Stress: Not on file  ?Social Connections: Not on file  ?Intimate Partner Violence: Not on file  ? ? ?No Known Allergies ? ?Current Outpatient Medications  ?Medication Sig Dispense Refill  ? ACCU-CHEK AVIVA PLUS test strip     ? Accu-Chek Softclix Lancets lancets SMARTSIG:Topical    ? amLODipine (NORVASC) 5 MG tablet Take 5 mg by mouth daily.    ? aspirin EC 325 MG tablet Take 162.5 mg by mouth daily.    ? cephALEXin (KEFLEX) 500 MG capsule 1 capsule    ? dorzolamide-timolol (COSOPT) 22.3-6.8 MG/ML ophthalmic solution 1 drop 2 (two) times daily.    ?  doxycycline (VIBRA-TABS) 100 MG tablet Take 1 tablet (100 mg total) by mouth 2 (two) times daily. 20 tablet 0  ? gabapentin (NEURONTIN) 100 MG capsule Take 100 mg by mouth 3 (three) times daily.    ? latanoprost (XALATAN) 0.005 % ophthalmic solution 1 drop at bedtime.    ? lidocaine (XYLOCAINE) 2 % jelly Apply 1 application. topically 3 (three) times daily as needed.    ? lidocaine (XYLOCAINE) 5 % ointment 1 application as needed to painful area    ? lisinopril (PRINIVIL,ZESTRIL) 5 MG tablet Take 5 mg by mouth daily.     ? lisinopril-hydrochlorothiazide (ZESTORETIC) 20-25 MG tablet     ? metFORMIN (GLUCOPHAGE) 500 MG tablet Take 250-500 mg by mouth 2 (two) times daily with a meal. 1/2 tablet (250 mg) if cbg under 110, 1 tablet (500 mg) if over 110    ? omeprazole (PRILOSEC) 40 MG capsule Take 40 mg by mouth daily.     ? pantoprazole (PROTONIX) 40 MG tablet Take 40 mg by mouth daily.    ? simvastatin (ZOCOR) 80 MG tablet Take 40 mg by mouth daily.    ? amoxicillin-clavulanate (AUGMENTIN) 875-125 MG tablet Take 1 tablet by mouth 2 (two) times daily. (Patient not taking: Reported on 10/08/2021) 28 tablet 0  ? ?No current facility-administered medications for this visit.  ? ? ?PHYSICAL EXAM ?Vitals:  ? 10/08/21 1117  ?BP: (!) 144/66  ?Pulse: 66  ?Resp: 20  ?Temp: 97.9 ?F (36.6 ?C)  ?SpO2: 100%  ?Weight: 142 lb (64.4 kg)  ?Height: '5\' 10"'$  (1.778 m)  ? ? ?Constitutional: well appearing elderly gentleman. no distress. Appears well nourished.  ?Cardiac: regular rate and rhythm.  ?Respiratory:  unlabored. ?Abdominal:  soft, non-tender, non-distended.  ?Peripheral vascular: no pedal pulses. Quarter sized ischemic ulcer over the right first MTP joint.  ? ?PERTINENT LABORATORY AND RADIOLOGIC DATA ? ?Most recent CBC ? ?  Latest Ref Rng & Units 03/03/2013  ?  4:47 AM 03/01/2013  ?  6:10 AM 02/28/2013  ?  3:12 PM  ?CBC  ?WBC 4.0 - 10.5 K/uL 9.1   12.5   13.0    ?Hemoglobin 13.0 - 17.0 g/dL 13.6   13.4   15.2    ?Hematocrit 39.0 - 52.0  % 38.7   38.2   43.0    ?Platelets 150 - 400 K/uL 188   157   170    ?  ? ?Most recent CMP ? ?  Latest Ref Rng & Units 03/03/2013  ?  4:47 AM 03/01/2013  ?  6:10 AM 02/28/2013  ?  3:12 PM  ?CMP  ?Glucose 70 - 99 mg/dL 134   82   130    ?BUN 6 - 23 mg/dL '17   18   25    '$ ?Creatinine  0.50 - 1.35 mg/dL 0.88   0.95   1.06    ?Sodium 135 - 145 mEq/L 140   134   132    ?Potassium 3.5 - 5.1 mEq/L 4.2   4.5   4.7    ?Chloride 96 - 112 mEq/L 107   102   95    ?CO2 19 - 32 mEq/L '23   20   25    '$ ?Calcium 8.4 - 10.5 mg/dL 8.6   8.9   10.1    ?Total Protein 6.0 - 8.3 g/dL  6.5   8.0    ?Total Bilirubin 0.3 - 1.2 mg/dL  1.0   1.0    ?Alkaline Phos 39 - 117 U/L  66   74    ?AST 0 - 37 U/L  18   19    ?ALT 0 - 53 U/L  12   15    ? ?Vascular Imaging: ? ?+-------+----------------+-----------+------------+------------+  ?ABI/TBIToday's ABI     Today's TBIPrevious ABIPrevious TBI  ?+-------+----------------+-----------+------------+------------+  ?Right  non-compressible.53                                  ?+-------+----------------+-----------+------------+------------+  ?Left   non-compressible.30                                  ?+-------+----------------+-----------+------------+------------+  ? ?Yevonne Aline. Stanford Breed, MD ?[facility] [place] ?Office Phone Number: [phone] ?10/08/2021 5:23 PM ? ?Total time spent on preparing this encounter including chart review, data review, collecting history, examining the patient, coordinating care for this new patient, 60 minutes. ? ?Portions of this report may have been transcribed using voice recognition software.  Every effort has been made to ensure accuracy; however, inadvertent computerized transcription errors may still be present. ? ? ? ?

## 2021-10-07 NOTE — H&P (View-Only) (Signed)
[facility] [place] ? ?ASSESSMENT / PLAN: ?Harold [name] is a 88 y.o. male with atherosclerosis of native arteries of left lower extremity causing ulceration. ? ? patients with chronic limb threatening ischemia have an annual risk of cardiovascular mortality of 25% and a high risk of amputation.  ? ?WIfI score 2 / 1 / 0. Clinical stage III. Moderate risk of amputation. Moderate potential benefit from revascularization. ? ?Recommend the following which can slow the progression of atherosclerosis and reduce the risk of major adverse cardiac / limb events:  ?Complete cessation from all tobacco products. ?Blood glucose control with goal A1c < 7%. ?Blood pressure control with goal blood pressure < 140/90 mmHg. ?Lipid reduction therapy with goal LDL-C <100 mg/dL (<70 if symptomatic from PAD).  ?Aspirin '81mg'$  PO QD.  ?Atorvastatin 40-'80mg'$  PO QD (or other "high intensity" statin therapy). ? ?Plan left lower extremity angiogram with possible intervention via right common femoral approach in cath lab 10/11/21.  ? ?CHIEF COMPLAINT: left foot ulceration ? ?HISTORY OF PRESENT ILLNESS: ?Harold [name] is a 88 y.o. male who presents to clinic for evaluation of left foot ulcer.  This is been present for many weeks.  He has been under the care of Dr. Cannon Kettle, has been doing meticulous wound care.  An ankle-brachial index was ordered, and suggestive of peripheral arterial disease.  He was referred for further evaluation.  The patient is a spry, and energetic 88 year old looking much younger than his stated age.  He is reportedly quite active per his daughter who is in the room with him.  His daughter is a Marine scientist in the neonatal unit at [employer]. ? ?Past Medical History:  ?Diagnosis Date  ? Arthritis   ? "generalized" (03/01/2013)  ? CAD (coronary artery disease)   ? Cataract   ? GERD (gastroesophageal reflux disease)   ? Hyperlipidemia   ? Myocardial infarction [facility]) 2004  ? Type II diabetes mellitus ([place])    ? Unspecified essential hypertension   ? ? ?Past Surgical History:  ?Procedure Laterality Date  ? BACK SURGERY  1970s  ? CATARACT EXTRACTION W/ INTRAOCULAR LENS IMPLANT Left 2013  ? CHOLECYSTECTOMY N/A 03/02/2013  ? Procedure: LAPAROSCOPIC CHOLECYSTECTOMY;  Surgeon: Rolm Bookbinder, MD;  Location: [facility];  Service: General;  Laterality: N/A;  ? CORONARY ANGIOPLASTY WITH STENT PLACEMENT    ? "had 2-3; they clogged; he later had OHS" (03/01/2013)  ? CORONARY ARTERY BYPASS GRAFT  02/2003  ? "triple" (03/01/2013)  ? ? ?Family History  ?Problem Relation Age of Onset  ? Heart disease Mother   ? Diabetes Mother   ? ? ?Social History  ? ?Socioeconomic History  ? Marital status: Single  ?  Spouse name: Not on file  ? Number of children: Not on file  ? Years of education: Not on file  ? Highest education level: Not on file  ?Occupational History  ? Not on file  ?Tobacco Use  ? Smoking status: Some Days  ?  Years: 30.00  ?  Types: Cigarettes  ? Smokeless tobacco: Never  ?Substance and Sexual Activity  ? Alcohol use: Yes  ?  Comment: 03/01/2013 "drank years ago; never had problem w/it"  ? Drug use: No  ? Sexual activity: Never  ?Other Topics Concern  ? Not on file  ?Social History Narrative  ? Not on file  ? ?Social Determinants of Health  ? ?Financial Resource Strain: Not on file  ?Food Insecurity: Not on file  ?Transportation Needs: Not on  file  ?Physical Activity: Not on file  ?Stress: Not on file  ?Social Connections: Not on file  ?Intimate Partner Violence: Not on file  ? ? ?No Known Allergies ? ?Current Outpatient Medications  ?Medication Sig Dispense Refill  ? ACCU-CHEK AVIVA PLUS test strip     ? Accu-Chek Softclix Lancets lancets SMARTSIG:Topical    ? amLODipine (NORVASC) 5 MG tablet Take 5 mg by mouth daily.    ? aspirin EC 325 MG tablet Take 162.5 mg by mouth daily.    ? cephALEXin (KEFLEX) 500 MG capsule 1 capsule    ? dorzolamide-timolol (COSOPT) 22.3-6.8 MG/ML ophthalmic solution 1 drop 2 (two) times daily.    ?  doxycycline (VIBRA-TABS) 100 MG tablet Take 1 tablet (100 mg total) by mouth 2 (two) times daily. 20 tablet 0  ? gabapentin (NEURONTIN) 100 MG capsule Take 100 mg by mouth 3 (three) times daily.    ? latanoprost (XALATAN) 0.005 % ophthalmic solution 1 drop at bedtime.    ? lidocaine (XYLOCAINE) 2 % jelly Apply 1 application. topically 3 (three) times daily as needed.    ? lidocaine (XYLOCAINE) 5 % ointment 1 application as needed to painful area    ? lisinopril (PRINIVIL,ZESTRIL) 5 MG tablet Take 5 mg by mouth daily.     ? lisinopril-hydrochlorothiazide (ZESTORETIC) 20-25 MG tablet     ? metFORMIN (GLUCOPHAGE) 500 MG tablet Take 250-500 mg by mouth 2 (two) times daily with a meal. 1/2 tablet (250 mg) if cbg under 110, 1 tablet (500 mg) if over 110    ? omeprazole (PRILOSEC) 40 MG capsule Take 40 mg by mouth daily.     ? pantoprazole (PROTONIX) 40 MG tablet Take 40 mg by mouth daily.    ? simvastatin (ZOCOR) 80 MG tablet Take 40 mg by mouth daily.    ? amoxicillin-clavulanate (AUGMENTIN) 875-125 MG tablet Take 1 tablet by mouth 2 (two) times daily. (Patient not taking: Reported on 10/08/2021) 28 tablet 0  ? ?No current facility-administered medications for this visit.  ? ? ?PHYSICAL EXAM ?Vitals:  ? 10/08/21 1117  ?BP: (!) 144/66  ?Pulse: 66  ?Resp: 20  ?Temp: 97.9 ?F (36.6 ?C)  ?SpO2: 100%  ?Weight: 142 lb (64.4 kg)  ?Height: '5\' 10"'$  (1.778 m)  ? ? ?Constitutional: well appearing elderly gentleman. no distress. Appears well nourished.  ?Cardiac: regular rate and rhythm.  ?Respiratory:  unlabored. ?Abdominal:  soft, non-tender, non-distended.  ?Peripheral vascular: no pedal pulses. Quarter sized ischemic ulcer over the right first MTP joint.  ? ?PERTINENT LABORATORY AND RADIOLOGIC DATA ? ?Most recent CBC ? ?  Latest Ref Rng & Units 03/03/2013  ?  4:47 AM 03/01/2013  ?  6:10 AM 02/28/2013  ?  3:12 PM  ?CBC  ?WBC 4.0 - 10.5 K/uL 9.1   12.5   13.0    ?Hemoglobin 13.0 - 17.0 g/dL 13.6   13.4   15.2    ?Hematocrit 39.0 - 52.0  % 38.7   38.2   43.0    ?Platelets 150 - 400 K/uL 188   157   170    ?  ? ?Most recent CMP ? ?  Latest Ref Rng & Units 03/03/2013  ?  4:47 AM 03/01/2013  ?  6:10 AM 02/28/2013  ?  3:12 PM  ?CMP  ?Glucose 70 - 99 mg/dL 134   82   130    ?BUN 6 - 23 mg/dL '17   18   25    '$ ?Creatinine  0.50 - 1.35 mg/dL 0.88   0.95   1.06    ?Sodium 135 - 145 mEq/L 140   134   132    ?Potassium 3.5 - 5.1 mEq/L 4.2   4.5   4.7    ?Chloride 96 - 112 mEq/L 107   102   95    ?CO2 19 - 32 mEq/L '23   20   25    '$ ?Calcium 8.4 - 10.5 mg/dL 8.6   8.9   10.1    ?Total Protein 6.0 - 8.3 g/dL  6.5   8.0    ?Total Bilirubin 0.3 - 1.2 mg/dL  1.0   1.0    ?Alkaline Phos 39 - 117 U/L  66   74    ?AST 0 - 37 U/L  18   19    ?ALT 0 - 53 U/L  12   15    ? ?Vascular Imaging: ? ?+-------+----------------+-----------+------------+------------+  ?ABI/TBIToday's ABI     Today's TBIPrevious ABIPrevious TBI  ?+-------+----------------+-----------+------------+------------+  ?Right  non-compressible.53                                  ?+-------+----------------+-----------+------------+------------+  ?Left   non-compressible.30                                  ?+-------+----------------+-----------+------------+------------+  ? ?Yevonne Aline. Stanford Breed, MD ?[facility] [place] ?Office Phone Number: [phone] ?10/08/2021 5:23 PM ? ?Total time spent on preparing this encounter including chart review, data review, collecting history, examining the patient, coordinating care for this new patient, 60 minutes. ? ?Portions of this report may have been transcribed using voice recognition software.  Every effort has been made to ensure accuracy; however, inadvertent computerized transcription errors may still be present. ? ? ? ?

## 2021-10-08 ENCOUNTER — Ambulatory Visit (INDEPENDENT_AMBULATORY_CARE_PROVIDER_SITE_OTHER): Payer: Medicare Other | Admitting: Vascular Surgery

## 2021-10-08 ENCOUNTER — Encounter: Payer: Self-pay | Admitting: Vascular Surgery

## 2021-10-08 ENCOUNTER — Other Ambulatory Visit: Payer: Self-pay

## 2021-10-08 VITALS — BP 144/66 | HR 66 | Temp 97.9°F | Resp 20 | Ht 70.0 in | Wt 142.0 lb

## 2021-10-08 DIAGNOSIS — I7025 Atherosclerosis of native arteries of other extremities with ulceration: Secondary | ICD-10-CM | POA: Diagnosis not present

## 2021-10-10 ENCOUNTER — Ambulatory Visit (INDEPENDENT_AMBULATORY_CARE_PROVIDER_SITE_OTHER): Payer: Medicare Other | Admitting: Sports Medicine

## 2021-10-10 ENCOUNTER — Ambulatory Visit (INDEPENDENT_AMBULATORY_CARE_PROVIDER_SITE_OTHER): Payer: Medicare Other

## 2021-10-10 DIAGNOSIS — M2011 Hallux valgus (acquired), right foot: Secondary | ICD-10-CM | POA: Diagnosis not present

## 2021-10-10 DIAGNOSIS — L97521 Non-pressure chronic ulcer of other part of left foot limited to breakdown of skin: Secondary | ICD-10-CM

## 2021-10-10 DIAGNOSIS — E119 Type 2 diabetes mellitus without complications: Secondary | ICD-10-CM | POA: Diagnosis not present

## 2021-10-10 DIAGNOSIS — M2012 Hallux valgus (acquired), left foot: Secondary | ICD-10-CM

## 2021-10-10 DIAGNOSIS — M21611 Bunion of right foot: Secondary | ICD-10-CM | POA: Diagnosis not present

## 2021-10-10 DIAGNOSIS — M2041 Other hammer toe(s) (acquired), right foot: Secondary | ICD-10-CM

## 2021-10-10 DIAGNOSIS — L97511 Non-pressure chronic ulcer of other part of right foot limited to breakdown of skin: Secondary | ICD-10-CM

## 2021-10-10 DIAGNOSIS — I739 Peripheral vascular disease, unspecified: Secondary | ICD-10-CM

## 2021-10-10 NOTE — Progress Notes (Signed)
Subjective: ?Harold [name] is a 88 y.o. male patient seen in office for evaluation of ulceration of the left and right foot.  Patient reports that the  honey also made his wounds burn and that he has pain. ? ?Patient is assisted by daughter this visit.  ? ?Patient Active Problem List  ? Diagnosis Date Noted  ? Preop cardiovascular exam 03/01/2013  ? Cholecystitis, acute 02/28/2013  ? CAD (coronary artery disease) of artery bypass graft 02/28/2013  ? Diabetes ([place]) 02/28/2013  ? Dyslipidemia 02/28/2013  ? Hypertension 02/28/2013  ? ?Current Outpatient Medications on File Prior to Visit  ?Medication Sig Dispense Refill  ? ACCU-CHEK AVIVA PLUS test strip     ? Accu-Chek Softclix Lancets lancets SMARTSIG:Topical    ? amLODipine (NORVASC) 5 MG tablet Take 5 mg by mouth daily.    ? aspirin EC 81 MG tablet Take 81 mg by mouth daily.    ? Cholecalciferol (VITAMIN D3) 50 MCG (2000 UT) TABS Take 2,000 Units by mouth daily.    ? dorzolamide-timolol (COSOPT) 22.3-6.8 MG/ML ophthalmic solution 1 drop 2 (two) times daily.    ? gabapentin (NEURONTIN) 100 MG capsule Take 100 mg by mouth 3 (three) times daily.    ? latanoprost (XALATAN) 0.005 % ophthalmic solution Place 1 drop into both eyes at bedtime.    ? lidocaine (XYLOCAINE) 2 % jelly Apply 1 application. topically 3 (three) times daily as needed.    ? lidocaine (XYLOCAINE) 5 % ointment 1 application. daily as needed for mild pain or moderate pain.    ? lisinopril-hydrochlorothiazide (ZESTORETIC) 20-25 MG tablet Take 1 tablet by mouth daily.    ? metFORMIN (GLUCOPHAGE) 500 MG tablet Take 500 mg by mouth 2 (two) times daily with a meal.    ? pantoprazole (PROTONIX) 40 MG tablet Take 40 mg by mouth daily.    ? simvastatin (ZOCOR) 80 MG tablet Take 40 mg by mouth at bedtime.    ? ?No current facility-administered medications on file prior to visit.  ? ?No Known Allergies ? ?No results found for this or any previous visit (from the past 2160 hour(s)). ? ?Objective: ?There were  no vitals filed for this visit. ? ?General: Patient is awake, alert, oriented x 3 and in no acute distress. ? ?Dermatology: Skin is warm and dry bilateral with a partial thickness ulceration present medial first MPJ on the left and right medial second toe at the first webspace.  Ulcerations measures 2 x 1.5 cm on the left with fibrotic central portion to the base and a 0.2 x 0.2 cm on the right. There is a macerated border with a fibrogranular base. The ulcerations do not probe to bone. There is no malodor, clear to yellow active drainage noted on the left with localized edema and erythema at the first metatarsal phalangeal joint on the left which appears to be slightly improving. ? ?Vascular: Dorsalis Pedis pulse = 0/4 Bilateral,  Posterior Tibial pulse = 0/4 Bilateral,  Capillary Fill Time < 5 seconds ? ?Neurologic: Protective diminished bilateral however patient is noted to be hypersensitive at the affected wound areas. ? ?Musculosketal: There is pain to palpation to the wound areas bilateral.  There is significant bunion and hammertoe deformity noted bilateral. ? ? ?No results for input(s): GRAMSTAIN, LABORGA in the last 8760 hours. ? ?Assessment and Plan:  ?Problem List Items Addressed This Visit   ? ?  ? Endocrine  ? Diabetes (Harold [name])  ? ?Other Visit Diagnoses   ? ? Ulcer  of left foot, limited to breakdown of skin ([name])    -  Primary  ? Toe ulcer, right, limited to breakdown of skin (Harold [name])      ? PAD (peripheral artery disease) (Harold [name])      ? Hallux valgus with bunions, right      ? ?  ? ? ? ?-Re-Examined patient and discussed the progression of the wound and treatment alternatives.  Plan of care discussed with daughter who is present at today's visit. ?-Cleanse ulcerations very gently using wound cleanser no debridement on the right due to uncertain vascular status ?-Applied triple antibiotic and gauze and Band-Aid dressings and advised patient to do the same daily ?-Continue with tramadol may add additional  Tylenol to help with pain if ineffective to call office for change in medication ?-Continue with vascular; procedure tomorrow  ?- Advised patient to go to the ER or return to office if the wound worsens or if constitutional symptoms are present. ?-Patient to return to office in  10 days for follow up wound care and  if wound is not better we will refer him to wound center. ? ?Landis Martins, DPM ?

## 2021-10-10 NOTE — Progress Notes (Signed)
SITUATION ?Reason for Visit: Fitting of Diabetic [place] ?Patient / Caregiver Report:  Patient is satisfied with fit and function of shoes and insoles. ? ?OBJECTIVE DATA: ?Patient History / Diagnosis:   ?  ICD-10-CM   ?1. Type 2 diabetes mellitus without complication, without long-term current use of insulin (HCC)  E11.9   ?  ?2. Hallux valgus with bunions, right  M20.11   ? M21.611   ?  ?3. Hammer toes of both feet  M20.41   ? M20.42   ?  ?4. Hallux valgus with bunions, left  M20.12   ? M21.612   ?  ? ? ?Change in Status:   None ? ?ACTIONS PERFORMED: ?In-Person Delivery, patient was fit with: ?- 1x pair A5500 PDAC approved prefabricated Diabetic Shoes: Apex [id number] ?- 3x pair Y8502 PDAC approved vacuum formed custom diabetic insoles; [employer]: [id number] ? ?Shoes and insoles were verified for structural integrity and safety. Patient wore shoes and insoles in office. Skin was inspected and free of areas of concern after wearing shoes and inserts. Shoes and inserts fit properly. Patient / Caregiver provided with ferbal instruction and demonstration regarding donning, doffing, wear, care, proper fit, function, purpose, cleaning, and use of shoes and insoles ' and in all related precautions and risks and benefits regarding shoes and insoles. Patient / Caregiver was instructed to wear properly fitting socks with shoes at all times. Patient was also provided with verbal instruction regarding how to report any failures or malfunctions of shoes or inserts, and necessary follow up care. Patient / Caregiver was also instructed to contact physician regarding change in status that may affect function of shoes and inserts.  ? ?Patient / Caregiver verbalized undersatnding of instruction provided. Patient / Caregiver demonstrated independence with proper donning and doffing of shoes and inserts. ? ?PLAN ?Patient to follow with treating physician as recommended. Plan of care was discussed with and agreed upon by patient  and/or caregiver. All questions were answered and concerns addressed. ? ?

## 2021-10-11 ENCOUNTER — Ambulatory Visit (HOSPITAL_COMMUNITY)
Admission: RE | Admit: 2021-10-11 | Discharge: 2021-10-11 | Disposition: A | Discharge status: Home / Self Care | Payer: Medicare Other | Attending: Vascular Surgery | Admitting: Vascular Surgery

## 2021-10-11 ENCOUNTER — Encounter (HOSPITAL_COMMUNITY)
Admission: RE | Disposition: A | Discharge status: Home / Self Care | Payer: Self-pay | Source: Home / Self Care | Attending: Vascular Surgery

## 2021-10-11 ENCOUNTER — Other Ambulatory Visit: Payer: Self-pay

## 2021-10-11 ENCOUNTER — Encounter (HOSPITAL_COMMUNITY): Payer: Self-pay | Admitting: Vascular Surgery

## 2021-10-11 ENCOUNTER — Ambulatory Visit (HOSPITAL_BASED_OUTPATIENT_CLINIC_OR_DEPARTMENT_OTHER): Payer: Medicare Other

## 2021-10-11 DIAGNOSIS — L97529 Non-pressure chronic ulcer of other part of left foot with unspecified severity: Secondary | ICD-10-CM | POA: Insufficient documentation

## 2021-10-11 DIAGNOSIS — Z01810 Encounter for preprocedural cardiovascular examination: Secondary | ICD-10-CM

## 2021-10-11 DIAGNOSIS — I70249 Atherosclerosis of native arteries of left leg with ulceration of unspecified site: Secondary | ICD-10-CM | POA: Diagnosis not present

## 2021-10-11 DIAGNOSIS — E1151 Type 2 diabetes mellitus with diabetic peripheral angiopathy without gangrene: Secondary | ICD-10-CM | POA: Diagnosis not present

## 2021-10-11 DIAGNOSIS — Z7984 Long term (current) use of oral hypoglycemic drugs: Secondary | ICD-10-CM | POA: Diagnosis not present

## 2021-10-11 DIAGNOSIS — I70245 Atherosclerosis of native arteries of left leg with ulceration of other part of foot: Secondary | ICD-10-CM | POA: Diagnosis not present

## 2021-10-11 DIAGNOSIS — E11621 Type 2 diabetes mellitus with foot ulcer: Secondary | ICD-10-CM | POA: Insufficient documentation

## 2021-10-11 DIAGNOSIS — Z7982 Long term (current) use of aspirin: Secondary | ICD-10-CM | POA: Insufficient documentation

## 2021-10-11 DIAGNOSIS — Z79899 Other long term (current) drug therapy: Secondary | ICD-10-CM | POA: Diagnosis not present

## 2021-10-11 DIAGNOSIS — F17210 Nicotine dependence, cigarettes, uncomplicated: Secondary | ICD-10-CM | POA: Diagnosis not present

## 2021-10-11 HISTORY — PX: ABDOMINAL AORTOGRAM W/LOWER EXTREMITY: CATH118223

## 2021-10-11 LAB — POCT I-STAT, CHEM 8
BUN: 16 mg/dL (ref 8–23)
Calcium, Ion: 1.23 mmol/L (ref 1.15–1.40)
Chloride: 105 mmol/L (ref 98–111)
Creatinine, Ser: 0.9 mg/dL (ref 0.61–1.24)
Glucose, Bld: 120 mg/dL — ABNORMAL HIGH (ref 70–99)
HCT: 38 % — ABNORMAL LOW (ref 39.0–52.0)
Hemoglobin: 12.9 g/dL — ABNORMAL LOW (ref 13.0–17.0)
Potassium: 3.5 mmol/L (ref 3.5–5.1)
Sodium: 142 mmol/L (ref 135–145)
TCO2: 25 mmol/L (ref 22–32)

## 2021-10-11 LAB — GLUCOSE, CAPILLARY: Glucose-Capillary: 95 mg/dL (ref 70–99)

## 2021-10-11 SURGERY — ABDOMINAL AORTOGRAM W/LOWER EXTREMITY
Anesthesia: LOCAL

## 2021-10-11 MED ORDER — SODIUM CHLORIDE 0.9 % WEIGHT BASED INFUSION: 1.0000 mL/kg/h | INTRAVENOUS | Status: DC

## 2021-10-11 MED ORDER — FENTANYL CITRATE (PF) 100 MCG/2ML IJ SOLN
INTRAMUSCULAR | Status: DC | PRN
  Administered 2021-10-11: 50 ug via INTRAVENOUS

## 2021-10-11 MED ORDER — ACETAMINOPHEN 325 MG PO TABS: 650.0000 mg | ORAL_TABLET | ORAL | Status: DC | PRN

## 2021-10-11 MED ORDER — SODIUM CHLORIDE 0.9 % IV SOLN: 250.0000 mL | INTRAVENOUS | Status: DC | PRN

## 2021-10-11 MED ORDER — SODIUM CHLORIDE 0.9 % IV SOLN: INTRAVENOUS | Status: DC

## 2021-10-11 MED ORDER — FENTANYL CITRATE (PF) 100 MCG/2ML IJ SOLN
INTRAMUSCULAR | Status: AC
Start: 2021-10-11 — End: ?
  Filled 2021-10-11: qty 2

## 2021-10-11 MED ORDER — HEPARIN SODIUM (PORCINE) 1000 UNIT/ML IJ SOLN
INTRAMUSCULAR | Status: DC | PRN
  Administered 2021-10-11: 7000 [IU] via INTRAVENOUS

## 2021-10-11 MED ORDER — SODIUM CHLORIDE 0.9% FLUSH: 3.0000 mL | Freq: Two times a day (BID) | INTRAVENOUS | Status: DC

## 2021-10-11 MED ORDER — SODIUM CHLORIDE 0.9% FLUSH: 3.0000 mL | INTRAVENOUS | Status: DC | PRN

## 2021-10-11 MED ORDER — HEPARIN (PORCINE) IN NACL 1000-0.9 UT/500ML-% IV SOLN
INTRAVENOUS | Status: DC | PRN
  Administered 2021-10-11 (×2): 500 mL

## 2021-10-11 MED ORDER — MIDAZOLAM HCL 2 MG/2ML IJ SOLN
INTRAMUSCULAR | Status: AC
  Filled 2021-10-11: qty 2

## 2021-10-11 MED ORDER — LIDOCAINE HCL (PF) 1 % IJ SOLN
INTRAMUSCULAR | Status: AC
  Filled 2021-10-11: qty 30

## 2021-10-11 MED ORDER — IODIXANOL 320 MG/ML IV SOLN
INTRAVENOUS | Status: DC | PRN
  Administered 2021-10-11: 100 mL

## 2021-10-11 MED ORDER — LIDOCAINE HCL (PF) 1 % IJ SOLN
INTRAMUSCULAR | Status: DC | PRN
  Administered 2021-10-11: 12 mL

## 2021-10-11 MED ORDER — HYDRALAZINE HCL 20 MG/ML IJ SOLN: 5.0000 mg | INTRAMUSCULAR | Status: DC | PRN

## 2021-10-11 MED ORDER — HEPARIN (PORCINE) IN NACL 1000-0.9 UT/500ML-% IV SOLN
INTRAVENOUS | Status: AC
  Filled 2021-10-11: qty 1000

## 2021-10-11 MED ORDER — LABETALOL HCL 5 MG/ML IV SOLN: 10.0000 mg | INTRAVENOUS | Status: DC | PRN

## 2021-10-11 MED ORDER — MIDAZOLAM HCL 2 MG/2ML IJ SOLN
INTRAMUSCULAR | Status: DC | PRN
  Administered 2021-10-11: 1 mg via INTRAVENOUS

## 2021-10-11 MED ORDER — ONDANSETRON HCL 4 MG/2ML IJ SOLN: 4.0000 mg | Freq: Four times a day (QID) | INTRAMUSCULAR | Status: DC | PRN

## 2021-10-11 SURGICAL SUPPLY — 19 items
CATH OMNI FLUSH 5F 65CM (CATHETERS) ×1 IMPLANT
CATH QUICKCROSS .018X135CM (MICROCATHETER) ×1 IMPLANT
CATH QUICKCROSS .035X135CM (MICROCATHETER) ×1 IMPLANT
CATH QUICKCROSS SUPP .018X90CM (MICROCATHETER) ×1 IMPLANT
DEVICE CLOSURE MYNXGRIP 6/7F (Vascular Products) ×1 IMPLANT
GLIDEWIRE ANGLED SS 035X260CM (WIRE) ×1 IMPLANT
KIT MICROPUNCTURE NIT STIFF (SHEATH) ×1 IMPLANT
KIT PV (KITS) ×2 IMPLANT
SHEATH 6FR R2P DEST SLENDER (SHEATH) ×1 IMPLANT
SHEATH PINNACLE 5F 10CM (SHEATH) ×1 IMPLANT
SHEATH PINNACLE 6F 10CM (SHEATH) ×1 IMPLANT
SHEATH PROBE COVER 6X72 (BAG) ×1 IMPLANT
SYR MEDRAD MARK V 150ML (SYRINGE) ×1 IMPLANT
TRANSDUCER W/STOPCOCK (MISCELLANEOUS) ×2 IMPLANT
TRAY PV CATH (CUSTOM PROCEDURE TRAY) ×2 IMPLANT
WIRE BENTSON .035X145CM (WIRE) ×1 IMPLANT
WIRE G V18X300CM (WIRE) ×1 IMPLANT
WIRE SHEPHERD 12G .018 (WIRE) ×1 IMPLANT
WIRE SPARTACORE .014X300CM (WIRE) ×1 IMPLANT

## 2021-10-11 NOTE — Interval H&P Note (Signed)
History and Physical Interval Note: ? ?10/11/2021 ?4:36 PM ? ?Harold [name]  has presented today for surgery, with the diagnosis of Peripheral atherosclerotic disease with ulcer.  The various methods of treatment have been discussed with the patient and family. After consideration of risks, benefits and other options for treatment, the patient has consented to  Procedure(s) with comments: ?ABDOMINAL AORTOGRAM W/LOWER EXTREMITY (N/A) - Left Lower Extremity as a surgical intervention.  The patient's history has been reviewed, patient examined, no change in status, stable for surgery.  I have reviewed the patient's chart and labs.  Questions were answered to the patient's satisfaction.   ? ? ?[name] ? ? ?

## 2021-10-11 NOTE — Op Note (Signed)
DATE OF SERVICE: 10/11/2021 ? ?PATIENT:  Harold [name]  88 y.o. male ? ?PRE-OPERATIVE DIAGNOSIS:  Atherosclerosis of native arteries of left lower extremity causing ulceration ? ?POST-OPERATIVE DIAGNOSIS:  Same ? ?PROCEDURE:   ?1) US guided right common femoral access ?2) Aortogram ?3) Left lower extremity angiogram with third order cannulation (120m total contrast) ?4) Conscious sedation (46 minutes) ? ?SURGEON:  TYevonne Aline HStanford Breed MD ? ?ASSISTANT: none ? ?ANESTHESIA:   local and IV sedation ? ?ESTIMATED BLOOD LOSS: minimal ? ?LOCAL MEDICATIONS USED:  LIDOCAINE  ? ?COUNTS: confirmed correct. ? ?PATIENT DISPOSITION:  PACU - hemodynamically stable. ?  ?Delay start of Pharmacological VTE agent (>24hrs) due to surgical blood loss or risk of bleeding: no ? ?INDICATION FOR PROCEDURE: Harold WILKis a 88y.o. male with atherosclerosis of left lower extremity and ulceration. After careful discussion of risks, benefits, and alternatives the patient was offered angiography. The patient understood and wished to proceed. ? ?OPERATIVE FINDINGS:  ?Terminal aorta and iliac arteries: ?Right hypogastric artery occluded ?No other flow limiting stenosis ? ?Left lower extremity: ?Common femoral artery: widely patent without significant stenosis  ?Profunda femoris artery: widely patent without significant stenosis  ?Superficial femoral artery: heavily diseased. Multifocal focal chronic total occlusion.  ?Popliteal artery: Heavily diseased above and behind the knee. Multifocal critical stenosis. Below knee popliteal artery patent without evidence of significant stenoiss. ?Anterior tibial artery: dominant tibial vessel. Fills the foot ?Tibioperoneal trunk: patent, but occludes distally ?Peroneal artery: reconstitutes in distal leg ?Posterior tibial artery: occluded ?Pedal circulation: fills via AT ? ?GLASS score. FP grade 4. IP grade 0. GLASS stage III. High complexity disease. ? ?WIfI score 2 / 1 / 0. Clinical stage III.  Moderate risk of amputation. Moderate potential benefit from revascularization. ? ?DESCRIPTION OF PROCEDURE: After identification of the patient in the pre-operative holding area, the patient was transferred to the operating room. The patient was positioned supine on the operating room table. Anesthesia was induced. The groins was prepped and draped in standard fashion. A surgical pause was performed confirming correct patient, procedure, and operative location. ? ?The right groin was anesthetized with subcutaneous injection of 1% lidocaine. Using ultrasound guidance, the right common femoral artery was accessed with micropuncture technique. Fluoroscopy was used to confirm cannulation over the femoral head. The 461F sheath was upsized to 77F.  ? ?A Benson wire was advanced into the distal aorta. Over the wire an omni flush catheter was advanced to the level of L2. Aortogram was performed - see above for details.  ? ?The left common iliac artery was selected with a glidewire advantage guidewire. The wire was advanced into the common femoral artery. Over the wire the omni flush catheter was advanced into the external iliac artery. Selective angiography was performed - see above for details.  ? ?The decision was made to intervene. The patient was heparinized with 7,000 units of heparin. The 77F sheath was exchanged for a 61F x 45cm sheath. Despite multiple wire and catheter exchanges, I was unable to cross the heavily diseased femoropopliteal segment. ? ?A mynx device was used to close the arteriotomy. Hemostasis was excellent upon completion. ? ?Conscious sedation was administered with the use of IV fentanyl and midazolam under continuous physician and nurse monitoring.  Heart rate, blood pressure, and oxygen saturation were continuously monitored.  Total sedation time was 46 minutes ? ?Upon completion of the case instrument and sharps counts were confirmed correct. The patient was transferred to the  PACU in good  condition. I was present for all portions of the procedure. ? ?PLAN: ASA '81mg'$  PO QD. High intensity statin therapy. Check vein mapping. Doubt usable vein in LLE given history of CABG with GSV harvest from left leg. Needs left femoral popliteal bypass, likely with PTFE.  ? ?Yevonne Aline. Stanford Breed, MD ?[facility] ?Office Phone Number: [phone] ?10/11/2021 10:55 AM ? ?

## 2021-10-11 NOTE — Discharge Instructions (Signed)
NO METFORMIN FOR 2 DAYS 

## 2021-10-11 NOTE — Progress Notes (Signed)
Vein mapping done and Dr Stanford Breed in and ok to d/c home; client up and right groin stable, no bleeding or hematoma ?

## 2021-10-11 NOTE — Progress Notes (Signed)
Lower extremity vein mapping has been completed.  ? ?Preliminary results in CV Proc.  ? ?Harold [name] ?10/11/2021 1:44 PM    ?

## 2021-10-14 LAB — POCT I-STAT, CHEM 8
BUN: 21 mg/dL (ref 8–23)
Calcium, Ion: 1.13 mmol/L — ABNORMAL LOW (ref 1.15–1.40)
Chloride: 108 mmol/L (ref 98–111)
Creatinine, Ser: 1 mg/dL (ref 0.61–1.24)
Glucose, Bld: 127 mg/dL — ABNORMAL HIGH (ref 70–99)
HCT: 36 % — ABNORMAL LOW (ref 39.0–52.0)
Hemoglobin: 12.2 g/dL — ABNORMAL LOW (ref 13.0–17.0)
Potassium: 6.3 mmol/L (ref 3.5–5.1)
Sodium: 139 mmol/L (ref 135–145)
TCO2: 26 mmol/L (ref 22–32)

## 2021-10-15 ENCOUNTER — Other Ambulatory Visit: Payer: Self-pay

## 2021-10-15 DIAGNOSIS — I7025 Atherosclerosis of native arteries of other extremities with ulceration: Secondary | ICD-10-CM

## 2021-10-23 ENCOUNTER — Other Ambulatory Visit: Payer: Self-pay

## 2021-10-23 ENCOUNTER — Encounter (HOSPITAL_COMMUNITY): Payer: Self-pay | Admitting: Vascular Surgery

## 2021-10-23 NOTE — Progress Notes (Signed)
Interview done with the daughter [name], due to the pt being hard of hearing. ? ?PCP - Dr. Brayton Layman ? ?Cardiologist - Denies ? ?EP- Denies ? ?Endocrine- Denies ? ?Pulm- Denies ? ?Chest x-ray - Denies ? ?EKG - 10/11/21 (E) ? ?Stress Test - Denies ? ?ECHO - Denies ? ?Cardiac Cath - 03/01/13 (E) ? ?AICD-na ?PM-na ?LOOP-na ? ?Nerve Stimulator- Denies ? ?Dialysis- Denies ? ?Sleep Study - Denies ?CPAP - Denies ? ?LABS- 10/24/21: ABG, CBC, CMP, PT, PTT, T/S x2, UA, PCR ? ?ASA- Cont. ? ?ERAS- No ? ?HA1C- unknown ?Fasting Blood Sugar - unknown, the pt does not check his bs as directed. ?Checks Blood Sugar ___1__ times a day, when he feels the need.  ? ?Anesthesia- No ? ?Dionne states the pt denies having chest pain, sob, or fever during the pre-op phone call. All instructions explained to the pt, with a verbal understanding of the material including: as of today, stop taking all Aspirin (unless instructed by your doctor) and Other Aspirin containing products, Vitamins, Fish oils, and Herbal medications. Also stop all NSAIDS i.e. Advil, Ibuprofen, Motrin, Aleve, Anaprox, Naproxen, BC, Goody Powders, and all Supplements.   ? ?WHAT DO I DO ABOUT MY DIABETES MEDICATION? ? ?Do not take Metformin(Glucophage) the morning of surgery. ? ?If your CBG is greater than 220 mg/dL, inform the staff upon arrival to Short Stay. ? ?How do I manage my blood sugar before surgery? ?Check your blood sugar the morning of your surgery when you wake up and every 2 hours until you get to the Short Stay unit. ?If your blood sugar is less than 70 mg/dL, you will need to treat for low blood sugar: ?Do not take insulin. ?Treat a low blood sugar (less than 70 mg/dL) with ? cup of clear juice (cranberry or apple), 4 glucose tablets, OR glucose gel. ?Recheck blood sugar in 15 minutes after treatment (to make sure it is greater than 70 mg/dL). If your blood sugar is not greater than 70 mg/dL on recheck, call [phone] ? for further instructions. ?Report your  blood sugar to the short stay nurse when you get to Short Stay. ? ?Reviewed and Endorsed by [facility] Patient Education Committee, August 2015 ? ?Pt also instructed to wear a mask and social distance if he goes out. The opportunity to ask questions was provided.  ?

## 2021-10-24 ENCOUNTER — Inpatient Hospital Stay (HOSPITAL_COMMUNITY)
Admission: RE | Admit: 2021-10-24 | Discharge: 2021-10-26 | DRG: 254 | Disposition: A | Discharge status: Home / Self Care | Payer: Medicare Other | Attending: Vascular Surgery | Admitting: Vascular Surgery

## 2021-10-24 ENCOUNTER — Other Ambulatory Visit: Payer: Self-pay

## 2021-10-24 ENCOUNTER — Encounter (HOSPITAL_COMMUNITY)
Admission: RE | Disposition: A | Discharge status: Home / Self Care | Payer: Self-pay | Source: Home / Self Care | Attending: Vascular Surgery

## 2021-10-24 ENCOUNTER — Encounter (HOSPITAL_COMMUNITY): Payer: Self-pay | Admitting: Vascular Surgery

## 2021-10-24 ENCOUNTER — Inpatient Hospital Stay (HOSPITAL_COMMUNITY): Payer: Medicare Other | Admitting: Certified Registered"

## 2021-10-24 ENCOUNTER — Ambulatory Visit: Payer: Medicare Other | Admitting: Sports Medicine

## 2021-10-24 DIAGNOSIS — I252 Old myocardial infarction: Secondary | ICD-10-CM

## 2021-10-24 DIAGNOSIS — Z79899 Other long term (current) drug therapy: Secondary | ICD-10-CM | POA: Diagnosis not present

## 2021-10-24 DIAGNOSIS — I70248 Atherosclerosis of native arteries of left leg with ulceration of other part of lower left leg: Secondary | ICD-10-CM

## 2021-10-24 DIAGNOSIS — M2041 Other hammer toe(s) (acquired), right foot: Secondary | ICD-10-CM | POA: Diagnosis not present

## 2021-10-24 DIAGNOSIS — Z7982 Long term (current) use of aspirin: Secondary | ICD-10-CM | POA: Diagnosis not present

## 2021-10-24 DIAGNOSIS — M21611 Bunion of right foot: Secondary | ICD-10-CM | POA: Diagnosis present

## 2021-10-24 DIAGNOSIS — M21612 Bunion of left foot: Secondary | ICD-10-CM | POA: Diagnosis present

## 2021-10-24 DIAGNOSIS — Z8249 Family history of ischemic heart disease and other diseases of the circulatory system: Secondary | ICD-10-CM

## 2021-10-24 DIAGNOSIS — E11621 Type 2 diabetes mellitus with foot ulcer: Secondary | ICD-10-CM | POA: Diagnosis present

## 2021-10-24 DIAGNOSIS — F17210 Nicotine dependence, cigarettes, uncomplicated: Secondary | ICD-10-CM | POA: Diagnosis present

## 2021-10-24 DIAGNOSIS — Z7984 Long term (current) use of oral hypoglycemic drugs: Secondary | ICD-10-CM | POA: Diagnosis not present

## 2021-10-24 DIAGNOSIS — E1151 Type 2 diabetes mellitus with diabetic peripheral angiopathy without gangrene: Secondary | ICD-10-CM

## 2021-10-24 DIAGNOSIS — I70245 Atherosclerosis of native arteries of left leg with ulceration of other part of foot: Secondary | ICD-10-CM | POA: Diagnosis not present

## 2021-10-24 DIAGNOSIS — E785 Hyperlipidemia, unspecified: Secondary | ICD-10-CM | POA: Diagnosis not present

## 2021-10-24 DIAGNOSIS — I10 Essential (primary) hypertension: Secondary | ICD-10-CM | POA: Diagnosis not present

## 2021-10-24 DIAGNOSIS — K219 Gastro-esophageal reflux disease without esophagitis: Secondary | ICD-10-CM | POA: Diagnosis not present

## 2021-10-24 DIAGNOSIS — Z955 Presence of coronary angioplasty implant and graft: Secondary | ICD-10-CM

## 2021-10-24 DIAGNOSIS — M2042 Other hammer toe(s) (acquired), left foot: Secondary | ICD-10-CM | POA: Diagnosis present

## 2021-10-24 DIAGNOSIS — I2510 Atherosclerotic heart disease of native coronary artery without angina pectoris: Secondary | ICD-10-CM | POA: Diagnosis present

## 2021-10-24 DIAGNOSIS — L97519 Non-pressure chronic ulcer of other part of right foot with unspecified severity: Secondary | ICD-10-CM | POA: Diagnosis not present

## 2021-10-24 DIAGNOSIS — L97529 Non-pressure chronic ulcer of other part of left foot with unspecified severity: Secondary | ICD-10-CM | POA: Diagnosis not present

## 2021-10-24 DIAGNOSIS — I739 Peripheral vascular disease, unspecified: Secondary | ICD-10-CM | POA: Diagnosis present

## 2021-10-24 DIAGNOSIS — Z833 Family history of diabetes mellitus: Secondary | ICD-10-CM

## 2021-10-24 DIAGNOSIS — Z951 Presence of aortocoronary bypass graft: Secondary | ICD-10-CM | POA: Diagnosis not present

## 2021-10-24 DIAGNOSIS — I7025 Atherosclerosis of native arteries of other extremities with ulceration: Secondary | ICD-10-CM

## 2021-10-24 HISTORY — DX: Other specified postprocedural states: Z98.890

## 2021-10-24 HISTORY — DX: Unspecified hearing loss, unspecified ear: H91.90

## 2021-10-24 HISTORY — DX: Other specified postprocedural states: R11.2

## 2021-10-24 HISTORY — PX: FEMORAL-POPLITEAL BYPASS GRAFT: SHX937

## 2021-10-24 LAB — CBC
HCT: 34.3 % — ABNORMAL LOW (ref 39.0–52.0)
Hemoglobin: 10.5 g/dL — ABNORMAL LOW (ref 13.0–17.0)
MCH: 31.3 pg (ref 26.0–34.0)
MCHC: 30.6 g/dL (ref 30.0–36.0)
MCV: 102.4 fL — ABNORMAL HIGH (ref 80.0–100.0)
Platelets: 205 10*3/uL (ref 150–400)
RBC: 3.35 MIL/uL — ABNORMAL LOW (ref 4.22–5.81)
RDW: 14.1 % (ref 11.5–15.5)
WBC: 7.8 10*3/uL (ref 4.0–10.5)
nRBC: 0 % (ref 0.0–0.2)

## 2021-10-24 LAB — COMPREHENSIVE METABOLIC PANEL
ALT: 9 U/L (ref 0–44)
AST: 17 U/L (ref 15–41)
Albumin: 2.4 g/dL — ABNORMAL LOW (ref 3.5–5.0)
Alkaline Phosphatase: 32 U/L — ABNORMAL LOW (ref 38–126)
Anion gap: 12 (ref 5–15)
BUN: 17 mg/dL (ref 8–23)
CO2: 17 mmol/L — ABNORMAL LOW (ref 22–32)
Calcium: 8.4 mg/dL — ABNORMAL LOW (ref 8.9–10.3)
Chloride: 108 mmol/L (ref 98–111)
Creatinine, Ser: 0.83 mg/dL (ref 0.61–1.24)
GFR, Estimated: >60 mL/min (ref >60)
Glucose, Bld: 94 mg/dL (ref 70–99)
Potassium: 4 mmol/L (ref 3.5–5.1)
Sodium: 137 mmol/L (ref 135–145)
Total Bilirubin: 0.7 mg/dL (ref 0.3–1.2)
Total Protein: 5.2 g/dL — ABNORMAL LOW (ref 6.5–8.1)

## 2021-10-24 LAB — GLUCOSE, CAPILLARY
Glucose-Capillary: 104 mg/dL — ABNORMAL HIGH (ref 70–99)
Glucose-Capillary: 116 mg/dL — ABNORMAL HIGH (ref 70–99)
Glucose-Capillary: 158 mg/dL — ABNORMAL HIGH (ref 70–99)
Glucose-Capillary: 145 mg/dL — ABNORMAL HIGH (ref 70–99)

## 2021-10-24 LAB — PROTIME-INR
INR: 1.5 — ABNORMAL HIGH (ref 0.8–1.2)
Prothrombin Time: 17.8 seconds — ABNORMAL HIGH (ref 11.4–15.2)

## 2021-10-24 LAB — SURGICAL PCR SCREEN
MRSA, PCR: POSITIVE — AB
Staphylococcus aureus: POSITIVE — AB

## 2021-10-24 LAB — TYPE AND SCREEN
ABO/RH(D): A POS
Antibody Screen: NEGATIVE

## 2021-10-24 LAB — BLOOD GAS, ARTERIAL
Acid-base deficit: 1.5 mmol/L (ref 0.0–2.0)
Bicarbonate: 22.9 mmol/L (ref 20.0–28.0)
O2 Saturation: 98.7 %
Patient temperature: 37
pCO2 arterial: 37 mmHg (ref 32–48)
pH, Arterial: 7.4 (ref 7.35–7.45)
pO2, Arterial: 116 mmHg — ABNORMAL HIGH (ref 83–108)

## 2021-10-24 LAB — ABO/RH: ABO/RH(D): A POS

## 2021-10-24 LAB — APTT: aPTT: 29 seconds (ref 24–36)

## 2021-10-24 SURGERY — BYPASS GRAFT FEMORAL-POPLITEAL ARTERY
Anesthesia: General | Site: Leg Upper | Laterality: Left

## 2021-10-24 MED ORDER — AMLODIPINE BESYLATE 5 MG PO TABS
5.0000 mg | ORAL_TABLET | Freq: Every day | ORAL | Status: DC
  Administered 2021-10-25 – 2021-10-26 (×2): 5 mg via ORAL
  Filled 2021-10-24 (×2): qty 1

## 2021-10-24 MED ORDER — POLYETHYLENE GLYCOL 3350 17 G PO PACK: 17.0000 g | PACK | Freq: Every day | ORAL | Status: DC | PRN

## 2021-10-24 MED ORDER — PHENYLEPHRINE HCL-NACL 20-0.9 MG/250ML-% IV SOLN
INTRAVENOUS | Status: DC | PRN
Start: 2021-10-24 — End: 2021-10-24
  Administered 2021-10-24: 50 ug/min via INTRAVENOUS

## 2021-10-24 MED ORDER — OXYCODONE-ACETAMINOPHEN 5-325 MG PO TABS
1.0000 | ORAL_TABLET | Freq: Four times a day (QID) | ORAL | Status: DC | PRN
  Administered 2021-10-24 – 2021-10-25 (×3): 2 via ORAL
  Filled 2021-10-24 (×3): qty 2

## 2021-10-24 MED ORDER — HYDROMORPHONE HCL 1 MG/ML IJ SOLN: 0.2500 mg | INTRAMUSCULAR | Status: DC | PRN

## 2021-10-24 MED ORDER — ONDANSETRON HCL 4 MG/2ML IJ SOLN: 4.0000 mg | Freq: Four times a day (QID) | INTRAMUSCULAR | Status: DC | PRN

## 2021-10-24 MED ORDER — INSULIN ASPART 100 UNIT/ML IJ SOLN
0.0000 [IU] | Freq: Three times a day (TID) | INTRAMUSCULAR | Status: DC
  Administered 2021-10-24: 2 [IU] via SUBCUTANEOUS
  Administered 2021-10-25: 1 [IU] via SUBCUTANEOUS

## 2021-10-24 MED ORDER — LISINOPRIL-HYDROCHLOROTHIAZIDE 20-25 MG PO TABS: 1.0000 | ORAL_TABLET | Freq: Every day | ORAL | Status: DC

## 2021-10-24 MED ORDER — HEPARIN SODIUM (PORCINE) 1000 UNIT/ML IJ SOLN
INTRAMUSCULAR | Status: DC | PRN
  Administered 2021-10-24: 7000 [IU] via INTRAVENOUS

## 2021-10-24 MED ORDER — ACETAMINOPHEN 325 MG PO TABS
325.0000 mg | ORAL_TABLET | ORAL | Status: DC | PRN
  Administered 2021-10-24: 650 mg via ORAL
  Filled 2021-10-24: qty 2
  Filled 2021-10-24: qty 1
  Filled 2021-10-24: qty 2

## 2021-10-24 MED ORDER — OXYCODONE HCL 5 MG/5ML PO SOLN: 5.0000 mg | Freq: Once | ORAL | Status: DC | PRN

## 2021-10-24 MED ORDER — ACETAMINOPHEN 10 MG/ML IV SOLN
INTRAVENOUS | Status: DC | PRN
  Administered 2021-10-24: 1000 mg via INTRAVENOUS

## 2021-10-24 MED ORDER — PHENYLEPHRINE 80 MCG/ML (10ML) SYRINGE FOR IV PUSH (FOR BLOOD PRESSURE SUPPORT)
PREFILLED_SYRINGE | INTRAVENOUS | Status: DC | PRN
Start: 2021-10-24 — End: 2021-10-24
  Administered 2021-10-24: 80 ug via INTRAVENOUS
  Administered 2021-10-24: 160 ug via INTRAVENOUS

## 2021-10-24 MED ORDER — LIDOCAINE 5 % EX OINT
1.0000 "application " | TOPICAL_OINTMENT | Freq: Every day | CUTANEOUS | Status: DC | PRN
  Filled 2021-10-24: qty 35.44

## 2021-10-24 MED ORDER — DOCUSATE SODIUM 100 MG PO CAPS
100.0000 mg | ORAL_CAPSULE | Freq: Every day | ORAL | Status: DC
  Administered 2021-10-25 – 2021-10-26 (×2): 100 mg via ORAL
  Filled 2021-10-24 (×2): qty 1

## 2021-10-24 MED ORDER — DORZOLAMIDE HCL-TIMOLOL MAL 2-0.5 % OP SOLN
1.0000 [drp] | Freq: Two times a day (BID) | OPHTHALMIC | Status: DC
  Administered 2021-10-24 – 2021-10-26 (×4): 1 [drp] via OPHTHALMIC
  Filled 2021-10-24: qty 10

## 2021-10-24 MED ORDER — MORPHINE SULFATE (PF) 2 MG/ML IV SOLN
2.0000 mg | INTRAVENOUS | Status: DC | PRN
  Administered 2021-10-24: 2 mg via INTRAVENOUS
  Filled 2021-10-24: qty 1

## 2021-10-24 MED ORDER — CHLORHEXIDINE GLUCONATE 0.12 % MT SOLN
OROMUCOSAL | Status: AC
  Administered 2021-10-24: 15 mL
  Filled 2021-10-24: qty 15

## 2021-10-24 MED ORDER — PANTOPRAZOLE SODIUM 40 MG PO TBEC
40.0000 mg | DELAYED_RELEASE_TABLET | Freq: Every day | ORAL | Status: DC
  Administered 2021-10-25 – 2021-10-26 (×2): 40 mg via ORAL
  Filled 2021-10-24 (×2): qty 1

## 2021-10-24 MED ORDER — GABAPENTIN 100 MG PO CAPS
100.0000 mg | ORAL_CAPSULE | Freq: Three times a day (TID) | ORAL | Status: DC
  Administered 2021-10-24 – 2021-10-26 (×6): 100 mg via ORAL
  Filled 2021-10-24 (×6): qty 1

## 2021-10-24 MED ORDER — MAGNESIUM SULFATE 2 GM/50ML IV SOLN: 2.0000 g | Freq: Every day | INTRAVENOUS | Status: DC | PRN

## 2021-10-24 MED ORDER — PHENOL 1.4 % MT LIQD: 1.0000 | OROMUCOSAL | Status: DC | PRN

## 2021-10-24 MED ORDER — PROPOFOL 10 MG/ML IV BOLUS
INTRAVENOUS | Status: DC | PRN
Start: 2021-10-24 — End: 2021-10-24
  Administered 2021-10-24: 90 mg via INTRAVENOUS

## 2021-10-24 MED ORDER — HEPARIN 6000 UNIT IRRIGATION SOLUTION
Status: AC
  Filled 2021-10-24: qty 500

## 2021-10-24 MED ORDER — CEFAZOLIN SODIUM-DEXTROSE 2-4 GM/100ML-% IV SOLN
2.0000 g | INTRAVENOUS | Status: AC
  Administered 2021-10-24: 2 g via INTRAVENOUS
  Filled 2021-10-24: qty 100

## 2021-10-24 MED ORDER — LISINOPRIL 20 MG PO TABS
20.0000 mg | ORAL_TABLET | Freq: Every day | ORAL | Status: DC
  Administered 2021-10-25 – 2021-10-26 (×2): 20 mg via ORAL
  Filled 2021-10-24 (×2): qty 1

## 2021-10-24 MED ORDER — 0.9 % SODIUM CHLORIDE (POUR BTL) OPTIME
TOPICAL | Status: DC | PRN
  Administered 2021-10-24: 2000 mL

## 2021-10-24 MED ORDER — MUPIROCIN 2 % EX OINT
1.0000 "application " | TOPICAL_OINTMENT | Freq: Two times a day (BID) | CUTANEOUS | Status: DC
  Administered 2021-10-24 – 2021-10-26 (×5): 1 via NASAL
  Filled 2021-10-24: qty 22

## 2021-10-24 MED ORDER — EPHEDRINE SULFATE-NACL 50-0.9 MG/10ML-% IV SOSY
PREFILLED_SYRINGE | INTRAVENOUS | Status: DC | PRN
  Administered 2021-10-24: 10 mg via INTRAVENOUS

## 2021-10-24 MED ORDER — HEPARIN SODIUM (PORCINE) 5000 UNIT/ML IJ SOLN
5000.0000 [IU] | Freq: Three times a day (TID) | INTRAMUSCULAR | Status: DC
  Administered 2021-10-25 – 2021-10-26 (×4): 5000 [IU] via SUBCUTANEOUS
  Filled 2021-10-24 (×4): qty 1

## 2021-10-24 MED ORDER — CEFAZOLIN SODIUM-DEXTROSE 2-4 GM/100ML-% IV SOLN
2.0000 g | Freq: Three times a day (TID) | INTRAVENOUS | Status: AC
  Administered 2021-10-24 – 2021-10-25 (×2): 2 g via INTRAVENOUS
  Filled 2021-10-24 (×2): qty 100

## 2021-10-24 MED ORDER — OXYCODONE HCL 5 MG PO TABS: 5.0000 mg | ORAL_TABLET | Freq: Once | ORAL | Status: DC | PRN

## 2021-10-24 MED ORDER — SODIUM CHLORIDE 0.9 % IV SOLN
500.0000 mL | Freq: Once | INTRAVENOUS | Status: DC | PRN
Start: 2021-10-24 — End: 2021-10-26

## 2021-10-24 MED ORDER — CHLORHEXIDINE GLUCONATE CLOTH 2 % EX PADS: 6.0000 | MEDICATED_PAD | Freq: Every day | CUTANEOUS | Status: DC

## 2021-10-24 MED ORDER — CHLORHEXIDINE GLUCONATE CLOTH 2 % EX PADS: 6.0000 | MEDICATED_PAD | Freq: Once | CUTANEOUS | Status: DC

## 2021-10-24 MED ORDER — ASPIRIN EC 81 MG PO TBEC
81.0000 mg | DELAYED_RELEASE_TABLET | Freq: Every day | ORAL | Status: DC
  Administered 2021-10-25 – 2021-10-26 (×2): 81 mg via ORAL
  Filled 2021-10-24 (×2): qty 1

## 2021-10-24 MED ORDER — LABETALOL HCL 5 MG/ML IV SOLN: 10.0000 mg | INTRAVENOUS | Status: DC | PRN

## 2021-10-24 MED ORDER — SODIUM CHLORIDE 0.9 % IV SOLN: INTRAVENOUS | Status: DC

## 2021-10-24 MED ORDER — BISACODYL 10 MG RE SUPP: 10.0000 mg | Freq: Every day | RECTAL | Status: DC | PRN

## 2021-10-24 MED ORDER — ROCURONIUM BROMIDE 10 MG/ML (PF) SYRINGE
PREFILLED_SYRINGE | INTRAVENOUS | Status: DC | PRN
  Administered 2021-10-24: 80 mg via INTRAVENOUS

## 2021-10-24 MED ORDER — HEMOSTATIC AGENTS (NO CHARGE) OPTIME
TOPICAL | Status: DC | PRN
  Administered 2021-10-24: 1 via TOPICAL

## 2021-10-24 MED ORDER — GUAIFENESIN-DM 100-10 MG/5ML PO SYRP: 15.0000 mL | ORAL_SOLUTION | ORAL | Status: DC | PRN

## 2021-10-24 MED ORDER — ALUM & MAG HYDROXIDE-SIMETH 200-200-20 MG/5ML PO SUSP: 15.0000 mL | ORAL | Status: DC | PRN

## 2021-10-24 MED ORDER — HYDROCHLOROTHIAZIDE 25 MG PO TABS
25.0000 mg | ORAL_TABLET | Freq: Every day | ORAL | Status: DC
  Administered 2021-10-25 – 2021-10-26 (×2): 25 mg via ORAL
  Filled 2021-10-24 (×2): qty 1

## 2021-10-24 MED ORDER — LACTATED RINGERS IV SOLN: INTRAVENOUS | Status: DC | PRN

## 2021-10-24 MED ORDER — VITAMIN D 25 MCG (1000 UNIT) PO TABS
2000.0000 [IU] | ORAL_TABLET | Freq: Every day | ORAL | Status: DC
  Administered 2021-10-25 – 2021-10-26 (×2): 2000 [IU] via ORAL
  Filled 2021-10-24 (×2): qty 2

## 2021-10-24 MED ORDER — PROTAMINE SULFATE 10 MG/ML IV SOLN
INTRAVENOUS | Status: AC
  Filled 2021-10-24: qty 5

## 2021-10-24 MED ORDER — FENTANYL CITRATE (PF) 250 MCG/5ML IJ SOLN
INTRAMUSCULAR | Status: AC
  Filled 2021-10-24: qty 5

## 2021-10-24 MED ORDER — ACETAMINOPHEN 10 MG/ML IV SOLN
INTRAVENOUS | Status: AC
  Filled 2021-10-24: qty 100

## 2021-10-24 MED ORDER — HEPARIN 6000 UNIT IRRIGATION SOLUTION
Status: DC | PRN
  Administered 2021-10-24: 1

## 2021-10-24 MED ORDER — PROTAMINE SULFATE 10 MG/ML IV SOLN
INTRAVENOUS | Status: DC | PRN
  Administered 2021-10-24: 50 mg via INTRAVENOUS

## 2021-10-24 MED ORDER — ACETAMINOPHEN 650 MG RE SUPP: 325.0000 mg | RECTAL | Status: DC | PRN

## 2021-10-24 MED ORDER — LIDOCAINE HCL URETHRAL/MUCOSAL 2 % EX GEL: 1.0000 "application " | Freq: Three times a day (TID) | CUTANEOUS | Status: DC | PRN

## 2021-10-24 MED ORDER — GLYCOPYRROLATE 0.2 MG/ML IJ SOLN
INTRAMUSCULAR | Status: DC | PRN
  Administered 2021-10-24: .2 mg via INTRAVENOUS

## 2021-10-24 MED ORDER — HYDRALAZINE HCL 20 MG/ML IJ SOLN: 5.0000 mg | INTRAMUSCULAR | Status: DC | PRN

## 2021-10-24 MED ORDER — DEXAMETHASONE SODIUM PHOSPHATE 10 MG/ML IJ SOLN
INTRAMUSCULAR | Status: DC | PRN
  Administered 2021-10-24: 4 mg via INTRAVENOUS

## 2021-10-24 MED ORDER — METFORMIN HCL 500 MG PO TABS
500.0000 mg | ORAL_TABLET | Freq: Two times a day (BID) | ORAL | Status: DC
  Administered 2021-10-24 – 2021-10-26 (×4): 500 mg via ORAL
  Filled 2021-10-24 (×4): qty 1

## 2021-10-24 MED ORDER — SUGAMMADEX SODIUM 200 MG/2ML IV SOLN
INTRAVENOUS | Status: DC | PRN
  Administered 2021-10-24: 150 mg via INTRAVENOUS
  Administered 2021-10-24: 50 mg via INTRAVENOUS

## 2021-10-24 MED ORDER — ONDANSETRON HCL 4 MG/2ML IJ SOLN
INTRAMUSCULAR | Status: DC | PRN
  Administered 2021-10-24: 4 mg via INTRAVENOUS

## 2021-10-24 MED ORDER — FENTANYL CITRATE (PF) 250 MCG/5ML IJ SOLN
INTRAMUSCULAR | Status: DC | PRN
Start: 2021-10-24 — End: 2021-10-24
  Administered 2021-10-24 (×3): 50 ug via INTRAVENOUS
  Administered 2021-10-24: 100 ug via INTRAVENOUS

## 2021-10-24 MED ORDER — LIDOCAINE 2% (20 MG/ML) 5 ML SYRINGE
INTRAMUSCULAR | Status: DC | PRN
  Administered 2021-10-24: 100 mg via INTRAVENOUS

## 2021-10-24 MED ORDER — LATANOPROST 0.005 % OP SOLN
1.0000 [drp] | Freq: Every day | OPHTHALMIC | Status: DC
  Administered 2021-10-24 – 2021-10-25 (×2): 1 [drp] via OPHTHALMIC
  Filled 2021-10-24: qty 2.5

## 2021-10-24 MED ORDER — SUGAMMADEX SODIUM 500 MG/5ML IV SOLN
INTRAVENOUS | Status: AC
  Filled 2021-10-24: qty 5

## 2021-10-24 MED ORDER — POTASSIUM CHLORIDE CRYS ER 20 MEQ PO TBCR: 20.0000 meq | EXTENDED_RELEASE_TABLET | Freq: Every day | ORAL | Status: DC | PRN

## 2021-10-24 MED ORDER — ATORVASTATIN CALCIUM 40 MG PO TABS
40.0000 mg | ORAL_TABLET | Freq: Every day | ORAL | Status: DC
  Administered 2021-10-24 – 2021-10-26 (×3): 40 mg via ORAL
  Filled 2021-10-24 (×3): qty 1

## 2021-10-24 MED ORDER — METOPROLOL TARTRATE 5 MG/5ML IV SOLN: 2.0000 mg | INTRAVENOUS | Status: DC | PRN

## 2021-10-24 MED ORDER — ONDANSETRON HCL 4 MG/2ML IJ SOLN: 4.0000 mg | Freq: Once | INTRAMUSCULAR | Status: DC | PRN

## 2021-10-24 SURGICAL SUPPLY — 47 items
BAG COUNTER SPONGE SURGICOUNT (BAG) ×2 IMPLANT
BANDAGE ESMARK 6X9 LF (GAUZE/BANDAGES/DRESSINGS) IMPLANT
BNDG CONFORM 2 STRL LF (GAUZE/BANDAGES/DRESSINGS) ×1 IMPLANT
BNDG ELASTIC 4X5.8 VLCR STR LF (GAUZE/BANDAGES/DRESSINGS) ×1 IMPLANT
BNDG ESMARK 6X9 LF (GAUZE/BANDAGES/DRESSINGS) ×2
CANISTER SUCT 3000ML PPV (MISCELLANEOUS) ×2 IMPLANT
CANNULA VESSEL 3MM 2 BLNT TIP (CANNULA) ×4 IMPLANT
CHLORAPREP W/TINT 26 (MISCELLANEOUS) ×4 IMPLANT
CLIP LIGATING EXTRA MED SLVR (CLIP) ×1 IMPLANT
CLIP LIGATING EXTRA SM BLUE (MISCELLANEOUS) ×1 IMPLANT
CUFF TOURN SGL QUICK 24 (TOURNIQUET CUFF) ×1
CUFF TRNQT CYL 24X4X40X1 (TOURNIQUET CUFF) IMPLANT
DERMABOND ADVANCED (GAUZE/BANDAGES/DRESSINGS) ×2
DERMABOND ADVANCED .7 DNX12 (GAUZE/BANDAGES/DRESSINGS) IMPLANT
ELECT REM PT RETURN 9FT ADLT (ELECTROSURGICAL) ×2
ELECTRODE REM PT RTRN 9FT ADLT (ELECTROSURGICAL) ×1 IMPLANT
GAUZE SPONGE 4X4 12PLY STRL (GAUZE/BANDAGES/DRESSINGS) ×2 IMPLANT
GAUZE XEROFORM 1X8 LF (GAUZE/BANDAGES/DRESSINGS) ×1 IMPLANT
GLOVE BIO SURGEON STRL SZ 6.5 (GLOVE) ×2 IMPLANT
GLOVE BIO SURGEON STRL SZ7.5 (GLOVE) ×1 IMPLANT
GLOVE BIO SURGEON STRL SZ8 (GLOVE) ×2 IMPLANT
GLOVE SURG SS PI 8.0 STRL IVOR (GLOVE) ×2 IMPLANT
GOWN STRL REUS W/ TWL LRG LVL3 (GOWN DISPOSABLE) ×2 IMPLANT
GOWN STRL REUS W/ TWL XL LVL3 (GOWN DISPOSABLE) ×1 IMPLANT
GOWN STRL REUS W/TWL LRG LVL3 (GOWN DISPOSABLE) ×4
GOWN STRL REUS W/TWL XL LVL3 (GOWN DISPOSABLE) ×6
GRAFT PROPATEN W/RING 6X80X60 (Vascular Products) ×1 IMPLANT
INSERT FOGARTY SM (MISCELLANEOUS) ×1 IMPLANT
KIT BASIN OR (CUSTOM PROCEDURE TRAY) ×2 IMPLANT
KIT TURNOVER KIT B (KITS) ×2 IMPLANT
NS IRRIG 1000ML POUR BTL (IV SOLUTION) ×4 IMPLANT
PACK PERIPHERAL VASCULAR (CUSTOM PROCEDURE TRAY) ×2 IMPLANT
PAD ARMBOARD 7.5X6 YLW CONV (MISCELLANEOUS) ×4 IMPLANT
POWDER SURGICEL 3.0 GRAM (HEMOSTASIS) ×1 IMPLANT
SET WALTER ACTIVATION W/DRAPE (SET/KITS/TRAYS/PACK) ×1 IMPLANT
SUT MNCRL AB 4-0 PS2 18 (SUTURE) ×4 IMPLANT
SUT PROLENE 5 0 C 1 24 (SUTURE) ×2 IMPLANT
SUT PROLENE 6 0 BV (SUTURE) ×5 IMPLANT
SUT VIC AB 2-0 CT1 27 (SUTURE) ×4
SUT VIC AB 2-0 CT1 TAPERPNT 27 (SUTURE) ×2 IMPLANT
SUT VIC AB 3-0 SH 27 (SUTURE) ×2
SUT VIC AB 3-0 SH 27X BRD (SUTURE) ×2 IMPLANT
TAPE UMBILICAL 1/8X18 (MISCELLANEOUS) ×1 IMPLANT
TOWEL GREEN STERILE (TOWEL DISPOSABLE) ×2 IMPLANT
TRAY FOLEY MTR SLVR 16FR STAT (SET/KITS/TRAYS/PACK) ×2 IMPLANT
UNDERPAD 30X36 HEAVY ABSORB (UNDERPADS AND DIAPERS) ×2 IMPLANT
WATER STERILE IRR 1000ML POUR (IV SOLUTION) ×2 IMPLANT

## 2021-10-24 NOTE — Anesthesia Procedure Notes (Signed)
Procedure Name: Intubation ?Date/Time: 10/24/2021 9:16 AM ?Performed by: Carmelina Paddock, RN ?Pre-anesthesia Checklist: Patient identified, Emergency Drugs available, Suction available and Patient being monitored ?Patient Re-evaluated:Patient Re-evaluated prior to induction ?Oxygen Delivery Method: Circle System Utilized ?Preoxygenation: Pre-oxygenation with 100% oxygen ?Induction Type: IV induction ?Ventilation: Mask ventilation without difficulty ?Grade View: Grade I ?Tube type: Oral ?Tube size: 7.5 mm ?Number of attempts: 1 ?Airway Equipment and Method: Stylet and Oral airway ?Placement Confirmation: ETT inserted through vocal cords under direct vision, positive ETCO2 and breath sounds checked- equal and bilateral ?Secured at: 24 cm ?Tube secured with: Tape ?Dental Injury: Teeth and Oropharynx as per pre-operative assessment  ? ? ? ? ?

## 2021-10-24 NOTE — Discharge Instructions (Signed)
 [facility]  Discharge instructions  Lower Extremity Bypass Surgery  Please refer to the following instruction for your post-procedure care. Your surgeon or physician assistant will discuss any changes with you.  Activity  You are encouraged to walk as much as you can. You can slowly return to normal activities during the month after your surgery. Avoid strenuous activity and heavy lifting until your doctor tells you it's OK. Avoid activities such as vacuuming or swinging a golf club. Do not drive until your doctor give the OK and you are no longer taking prescription pain medications. It is also normal to have difficulty with sleep habits, eating and bowel movement after surgery. These will go away with time.  Bathing/Showering  Shower daily after you go home. Do not soak in a bathtub, hot tub, or swim until the incision heals completely.  Incision Care  Clean your incision with mild soap and water. Shower every day. Pat the area dry with a clean towel. You do not need a bandage unless otherwise instructed. Do not apply any ointments or creams to your incision. If you have open wounds you will be instructed how to care for them or a visiting nurse may be arranged for you. If you have staples or sutures along your incision they will be removed at your post-op appointment. You may have skin glue on your incision. Do not peel it off. It will come off on its own in about one week.  Wash the groin wound with soap and water daily and pat dry. (No tub bath-only shower)  Then put a dry gauze or washcloth in the groin to keep this area dry to help prevent wound infection.  Do this daily and as needed.  Do not use Vaseline or neosporin on your incisions.  Only use soap and water on your incisions and then protect and keep dry.  Diet  Resume your normal diet. There are no special food restrictions following this procedure. A low fat/ low cholesterol diet is  recommended for all patients with vascular disease. In order to heal from your surgery, it is CRITICAL to get adequate nutrition. Your body requires vitamins, minerals, and protein. Vegetables are the best source of vitamins and minerals. Vegetables also provide the perfect balance of protein. Processed food has little nutritional value, so try to avoid this.  Medications  Resume taking all your medications unless your doctor or physician assistant tells you not to. If your incision is causing pain, you may take over-the-counter pain relievers such as acetaminophen (Tylenol). If you were prescribed a stronger pain medication, please aware these medication can cause nausea and constipation. Prevent nausea by taking the medication with a snack or meal. Avoid constipation by drinking plenty of fluids and eating foods with high amount of fiber, such as fruits, vegetables, and grains. Take Colace 100 mg (an over-the-counter stool softener) twice a day as needed for constipation.  Do not take Tylenol if you are taking prescription pain medications.  Follow Up  Our office will schedule a follow up appointment 2-3 weeks following discharge.  Please call us immediately for any of the following conditions  Severe or worsening pain in your legs or feet while at rest or while walking Increase pain, redness, warmth, or drainage (pus) from your incision site(s) Fever of 101 degree or higher The swelling in your leg with the bypass suddenly worsens and becomes more painful than when you were in the hospital If you have   been instructed to feel your graft pulse then you should do so every day. If you can no longer feel this pulse, call the office immediately. Not all patients are given this instruction.  Leg swelling is common after leg bypass surgery.  The swelling should improve over a few months following surgery. To improve the swelling, you may elevate your legs above the level of your heart while you are  sitting or resting. Your surgeon or physician assistant may ask you to apply an ACE wrap or wear compression (TED) stockings to help to reduce swelling.  Reduce your risk of vascular disease  Stop smoking. If you would like help call [facility] at 1-800-QUIT-NOW (1-800-784-8669) or [place] at [phone].  Manage your cholesterol Maintain a desired weight Control your diabetes weight Control your diabetes Keep your blood pressure down  If you have any questions, please call the office at [phone]  

## 2021-10-24 NOTE — Anesthesia Procedure Notes (Signed)
Arterial Line Insertion ?Start/End5/04/2022 8:45 AM, 10/24/2021 8:50 AM ?Performed by: Carmelina Paddock, RN, CRNA ? Patient location: Pre-op. ?Preanesthetic checklist: patient identified, IV checked, site marked, risks and benefits discussed, surgical consent, monitors and equipment checked, pre-op evaluation, timeout performed and anesthesia consent ?Lidocaine 1% used for infiltration ?radial was placed ?Catheter size: 20 G ?Hand hygiene performed  and maximum sterile barriers used  ? ?Attempts: 1 ?Procedure performed without using ultrasound guided technique. ?Following insertion, dressing applied. ?Post procedure assessment: normal and unchanged ? ? ? ?

## 2021-10-24 NOTE — Op Note (Signed)
DATE OF SERVICE: 10/24/2021 ? ?PATIENT:  Harold [name]  88 y.o. male ? ?PRE-OPERATIVE DIAGNOSIS:  atherosclerosis of native arteries of left lower extremity causing ulceration ? ?POST-OPERATIVE DIAGNOSIS:  Same ? ?PROCEDURE:   ?left common femoral to below knee popliteal bypass with 71m ePTFE ? ?SURGEON:  Surgeon(s) and Role: ?   * HCherre Robins MD - Primary ?   *Donzetta Matters BGeorgia Dom MD - Assisting ? ?ASSISTANT: SLeontine Locket PA-C ? ?An experienced assistant was required given the complexity of this procedure and the standard of surgical care. My assistant helped with exposure through counter tension, suctioning, ligation and retraction to better visualize the surgical field.  My assistant expedited sewing during the case by following my sutures. Wherever I use the term "we" in the report, my assistant actively helped me with that portion of the procedure. ? ?ANESTHESIA:   general ? ?EBL: 2085m? ?BLOOD ADMINISTERED:none ? ?DRAINS: none  ? ?LOCAL MEDICATIONS USED:  NONE ? ?SPECIMEN:  none ? ?COUNTS: confirmed correct. ? ?TOURNIQUET:    ?Total Tourniquet Time Documented: ?Thigh (Left) - 24 minutes ?Total: Thigh (Left) - 24 minutes ? ? ?PATIENT DISPOSITION:  PACU - hemodynamically stable. ?  ?Delay start of Pharmacological VTE agent (>24hrs) due to surgical blood loss or risk of bleeding: no ? ?INDICATION FOR PROCEDURE: FlCANDY ZIEGLERs a [age].o. male with ischemic ulcer about the left foot. Preoperative angiogram showed severe femoropopliteal disease. I was not able to cross this to treat it minimally invasively. After careful discussion of risks, benefits, and alternatives the patient was offered left femoral-popliteal bypass. The patient understood and wished to proceed. ? ?OPERATIVE FINDINGS: unremarkable fem-pop bypass. Good techincal result with brisk doppler flow in the AT at completion. The signal obliterated when the bypass was occluded. ? ?DESCRIPTION OF PROCEDURE: After identification  of the patient in the pre-operative holding area, the patient was transferred to the operating room. The patient was positioned supine on the operating room table. Anesthesia was induced. The left leg was prepped and draped in standard fashion. A surgical pause was performed confirming correct patient, procedure, and operative location. ? ?An oblique incision was made in the left groin to expose the common femoral artery.  The incision was carried down through the subcutaneous tissue until the femoral sheath was encountered.  The common femoral artery was skeletonized for several centimeters. ? ?A longitudinal incision in the proximal, medial Left calf was made to expose the below-knee popliteal artery.  Incision was carried out subtenons tissue until the superficial posterior compartment was encountered.  The fascia was divided.  The gastrocnemius was swept posteriorly.  The popliteal vascular bundle was identified.  The popliteal vein was encircled and retracted posteriorly.  The popliteal artery was exposed.  A soft area amenable to bypass target was identified. ? ?A 6 mm externally supported PTFE vascular graft was brought onto the field.  A Kelly-Wick tunneler was used to create an anatomic tunnel between the popliteal and femoral exposures.  The vascular graft was delivered through the tunnel. ? ?Patient was systemically heparinized.  Activated clotting time measurements were used throughout the case perform adequate anticoagulation. ? ?Clamps were applied to the common femoral artery proximally and distally.  An anterior arteriotomy was made with 11 blade.  The proximal end of the vascular graft was spatulated and sewn end to side to the common femoral arteriotomy using continuous running suture of 5-0 Prolene.  The anastomosis was flushed on the open end of  the vascular graft.  A clamp was applied to the vascular graft. ? ?The left leg was exsanguinated with an Esmarch tourniquet.  A pneumatic tourniquet was  applied to the thigh and inflated to 250 mmHg.  The time was noted.  A anterior lateral arteriotomy was made on the popliteal artery using 11 blade extended with Potts scissors.  A limited endarterectomy was necessary to achieve a good anastomosis.  The distal end of the vascular graft was spatulated to allow end to side anastomosis to the popliteal artery.  This anastomosis was completed using continuous running suture of 6-0 Prolene.  Immediately prior to completion the anastomosis was flushed and de-aired.  The pneumatic tourniquet was released.  The clamp on the vascular graft was released.  Hemostasis was achieved in the surgical beds and anastomoses. ? ?The lower extremity was evaluated with Doppler machine.  Strong anterior tibial signal was heard at the ankle.  This obliterated with occluding the bypass graft.  Satisfied with the case here. ? ?Heparin was reversed with protamine.  Once hemostasis was again confirmed in the surgical beds, the wounds were closed in layers.  2-0 Vicryl, 3-0 Vicryl, 4-0 Monocryl was used to close both the incisions in layers.  Dermabond was applied to the incisions. ? ?Upon completion of the case instrument and sharps counts were confirmed correct. The patient was transferred to the PACU in good condition. I was present for all portions of the procedure. ? ?Harold [name]. Stanford Breed, MD ?[facility] ?Office Phone Number: [phone] ?10/24/2021 12:02 PM ? ? ? ?

## 2021-10-24 NOTE — Interval H&P Note (Signed)
History and Physical Interval Note: ? ?10/24/2021 ?8:17 AM ? ?Harold [name]  has presented today for surgery, with the diagnosis of Atherosclerosis of native arteries of the extremities with ulceration.  The various methods of treatment have been discussed with the patient and family. After consideration of risks, benefits and other options for treatment, the patient has consented to  Procedure(s): ?BYPASS GRAFT LEFT COMMON FEMORAL-BELOW KNEE POPLITEAL ARTERY (Left) as a surgical intervention.  The patient's history has been reviewed, patient examined, no change in status, stable for surgery.  I have reviewed the patient's chart and labs.  Questions were answered to the patient's satisfaction.   ? ? ?[name] ? ? ?

## 2021-10-24 NOTE — Transfer of Care (Signed)
Immediate Anesthesia Transfer of Care Note ? ?Patient: [name] ? ?Procedure(s) Performed: BYPASS LEFT COMMON FEMORAL-BELOW KNEE POPLITEAL ARTERY WITH 6MM BY 80CM RING GRAFT (Left: Leg Upper) ? ?Patient Location: PACU ? ?Anesthesia Type:General ? ?Level of Consciousness: drowsy and patient cooperative ? ?Airway & Oxygen Therapy: Patient Spontanous Breathing and Patient connected to nasal cannula oxygen ? ?Post-op Assessment: Report given to RN and Post -op Vital signs reviewed and stable ? ?Post vital signs: Reviewed and stable ? ?Last Vitals:  ?Vitals Value Taken Time  ?BP 136/67 10/24/21 1139  ?Temp    ?Pulse 72 10/24/21 1140  ?Resp 10 10/24/21 1140  ?SpO2 100 % 10/24/21 1140  ?Vitals shown include unvalidated device data. ? ?Last Pain:  ?Vitals:  ? 10/24/21 0806  ?TempSrc:   ?PainSc: 10-Worst pain ever  ?   ? ?Patients Stated Pain Goal: 3 (10/24/21 3735) ? ?Complications: No notable events documented. ?

## 2021-10-24 NOTE — Anesthesia Preprocedure Evaluation (Signed)
Anesthesia Evaluation  ?Patient identified by MRN, date of birth, ID band ?Patient awake ? ? ? ?Reviewed: ?Allergy & Precautions, NPO status , Patient's Chart, lab work & pertinent test results ? ?History of Anesthesia Complications ?(+) PONV and history of anesthetic complications ? ?Airway ?Mallampati: II ? ?TM Distance: >3 FB ?Neck ROM: Full ? ? ? Dental ?no notable dental hx. ? ?  ?Pulmonary ?neg pulmonary ROS, former smoker,  ?  ?Pulmonary exam normal ?breath sounds clear to auscultation ? ? ? ? ? ? Cardiovascular ?hypertension, + CAD, + Past MI, + Cardiac Stents and + CABG  ?Normal cardiovascular exam ?Rhythm:Regular Rate:Normal ? ? ?  ?Neuro/Psych ?negative neurological ROS ? negative psych ROS  ? GI/Hepatic ?negative GI ROS, Neg liver ROS,   ?Endo/Other  ?diabetes, Type 2 ? Renal/GU ?negative Renal ROS  ?negative genitourinary ?  ?Musculoskeletal ?negative musculoskeletal ROS ?(+)  ? Abdominal ?  ?Peds ?negative pediatric ROS ?(+)  Hematology ?negative hematology ROS ?(+)   ?Anesthesia Other Findings ? ? Reproductive/Obstetrics ?negative OB ROS ? ?  ? ? ? ? ? ? ? ? ? ? ? ? ? ?  ?  ? ? ? ? ? ? ? ? ?Anesthesia Physical ?Anesthesia Plan ? ?ASA: 3 ? ?Anesthesia Plan: General  ? ?Post-op Pain Management: Ofirmev IV (intra-op)*  ? ?Induction: Intravenous ? ?PONV Risk Score and Plan: 3 and Ondansetron, Dexamethasone and Treatment may vary due to age or medical condition ? ?Airway Management Planned: Oral ETT ? ?Additional Equipment: Arterial line ? ?Intra-op Plan:  ? ?Post-operative Plan: Extubation in OR ? ?Informed Consent: I have reviewed the patients History and Physical, chart, labs and discussed the procedure including the risks, benefits and alternatives for the proposed anesthesia with the patient or authorized representative who has indicated his/her understanding and acceptance.  ? ? ? ?Dental advisory given ? ?Plan Discussed with: CRNA and Surgeon ? ?Anesthesia Plan  Comments:   ? ? ? ? ? ? ?Anesthesia Quick Evaluation ? ?

## 2021-10-24 NOTE — Anesthesia Postprocedure Evaluation (Signed)
Anesthesia Post Note ? ?Patient: EDIBERTO SENS ? ?Procedure(s) Performed: BYPASS LEFT COMMON FEMORAL-BELOW KNEE POPLITEAL ARTERY WITH 6MM BY 80CM RING GRAFT (Left: Leg Upper) ? ?  ? ?Patient location during evaluation: PACU ?Anesthesia Type: General ?Level of consciousness: awake and alert ?Pain management: pain level controlled ?Vital Signs Assessment: post-procedure vital signs reviewed and stable ?Respiratory status: spontaneous breathing, nonlabored ventilation, respiratory function stable and patient connected to nasal cannula oxygen ?Cardiovascular status: blood pressure returned to baseline and stable ?Postop Assessment: no apparent nausea or vomiting ?Anesthetic complications: no ? ? ?No notable events documented. ? ?Last Vitals:  ?Vitals:  ? 10/24/21 1210 10/24/21 1240  ?BP: (!) 134/50 (!) 143/52  ?Pulse: (!) 59 (!) 48  ?Resp: 11 12  ?Temp:    ?SpO2: 100% 100%  ?  ?Last Pain:  ?Vitals:  ? 10/24/21 1240  ?TempSrc:   ?PainSc: 0-No pain  ? ? ?  ?  ?  ?  ?  ?  ? ?Ajah Vanhoose S ? ? ? ? ?

## 2021-10-25 ENCOUNTER — Other Ambulatory Visit: Payer: Self-pay | Admitting: Sports Medicine

## 2021-10-25 ENCOUNTER — Encounter (HOSPITAL_COMMUNITY): Payer: Self-pay | Admitting: Vascular Surgery

## 2021-10-25 DIAGNOSIS — L97511 Non-pressure chronic ulcer of other part of right foot limited to breakdown of skin: Secondary | ICD-10-CM

## 2021-10-25 DIAGNOSIS — L97522 Non-pressure chronic ulcer of other part of left foot with fat layer exposed: Secondary | ICD-10-CM

## 2021-10-25 DIAGNOSIS — I739 Peripheral vascular disease, unspecified: Secondary | ICD-10-CM

## 2021-10-25 DIAGNOSIS — E11621 Type 2 diabetes mellitus with foot ulcer: Secondary | ICD-10-CM

## 2021-10-25 DIAGNOSIS — E119 Type 2 diabetes mellitus without complications: Secondary | ICD-10-CM

## 2021-10-25 LAB — BASIC METABOLIC PANEL
Anion gap: 8 (ref 5–15)
BUN: 16 mg/dL (ref 8–23)
CO2: 22 mmol/L (ref 22–32)
Calcium: 8.3 mg/dL — ABNORMAL LOW (ref 8.9–10.3)
Chloride: 105 mmol/L (ref 98–111)
Creatinine, Ser: 0.98 mg/dL (ref 0.61–1.24)
GFR, Estimated: >60 mL/min (ref >60)
Glucose, Bld: 144 mg/dL — ABNORMAL HIGH (ref 70–99)
Potassium: 3.9 mmol/L (ref 3.5–5.1)
Sodium: 135 mmol/L (ref 135–145)

## 2021-10-25 LAB — GLUCOSE, CAPILLARY
Glucose-Capillary: 126 mg/dL — ABNORMAL HIGH (ref 70–99)
Glucose-Capillary: 119 mg/dL — ABNORMAL HIGH (ref 70–99)
Glucose-Capillary: 123 mg/dL — ABNORMAL HIGH (ref 70–99)
Glucose-Capillary: 142 mg/dL — ABNORMAL HIGH (ref 70–99)

## 2021-10-25 LAB — LIPID PANEL
Cholesterol: 136 mg/dL (ref 0–200)
HDL: 55 mg/dL (ref >40)
LDL Cholesterol: 71 mg/dL (ref 0–99)
Total CHOL/HDL Ratio: 2.5 RATIO
Triglycerides: 51 mg/dL (ref <150)
VLDL: 10 mg/dL (ref 0–40)

## 2021-10-25 LAB — HEMOGLOBIN A1C
Hgb A1c MFr Bld: 6.7 % — ABNORMAL HIGH (ref 4.8–5.6)
Mean Plasma Glucose: 145.59 mg/dL

## 2021-10-25 LAB — CBC
HCT: 32.2 % — ABNORMAL LOW (ref 39.0–52.0)
Hemoglobin: 10.9 g/dL — ABNORMAL LOW (ref 13.0–17.0)
MCH: 31.3 pg (ref 26.0–34.0)
MCHC: 33.9 g/dL (ref 30.0–36.0)
MCV: 92.5 fL (ref 80.0–100.0)
Platelets: 235 10*3/uL (ref 150–400)
RBC: 3.48 MIL/uL — ABNORMAL LOW (ref 4.22–5.81)
RDW: 13.8 % (ref 11.5–15.5)
WBC: 8.7 10*3/uL (ref 4.0–10.5)
nRBC: 0 % (ref 0.0–0.2)

## 2021-10-25 MED ORDER — MUPIROCIN CALCIUM 2 % EX CREA
TOPICAL_CREAM | Freq: Every day | CUTANEOUS | Status: DC
  Filled 2021-10-25: qty 15

## 2021-10-25 NOTE — Progress Notes (Addendum)
?  Progress Note ? ? ? ?10/25/2021 ?7:21 AM ?1 Day Post-Op ? ?Subjective:  says his foot feels better ? ?Afebrile ?HR 40's-50's  ?616'W-737'T systolic ?062% RA ? ?Vitals:  ? 10/25/21 0500 10/25/21 0600  ?BP: (!) 115/51 (!) 134/51  ?Pulse: (!) 47 (!) 49  ?Resp: 14 16  ?Temp:    ?SpO2: 100% 100%  ? ? ?Physical Exam: ?Cardiac:  irregular ?Lungs:  non labored ?Incisions:  left groin and left below knee incisions are clean and dry ?Extremities:  brisk left peroneal doppler signal ? ? ?CBC ?   ?Component Value Date/Time  ? WBC 8.7 10/25/2021 0241  ? RBC 3.48 (L) 10/25/2021 0241  ? HGB 10.9 (L) 10/25/2021 0241  ? HCT 32.2 (L) 10/25/2021 0241  ? PLT 235 10/25/2021 0241  ? MCV 92.5 10/25/2021 0241  ? MCH 31.3 10/25/2021 0241  ? MCHC 33.9 10/25/2021 0241  ? RDW 13.8 10/25/2021 0241  ? LYMPHSABS 1.3 03/03/2013 0447  ? MONOABS 1.3 (H) 03/03/2013 0447  ? EOSABS 0.0 03/03/2013 0447  ? BASOSABS 0.0 03/03/2013 0447  ? ? ?BMET ?   ?Component Value Date/Time  ? NA 135 10/25/2021 0241  ? K 3.9 10/25/2021 0241  ? CL 105 10/25/2021 0241  ? CO2 22 10/25/2021 0241  ? GLUCOSE 144 (H) 10/25/2021 0241  ? BUN 16 10/25/2021 0241  ? CREATININE 0.98 10/25/2021 0241  ? CALCIUM 8.3 (L) 10/25/2021 0241  ? GFRNONAA >60 10/25/2021 0241  ? GFRAA >90 03/03/2013 0447  ? ? ?INR ?   ?Component Value Date/Time  ? INR 1.5 (H) 10/24/2021 0844  ? ? ? ?Intake/Output Summary (Last 24 hours) at 10/25/2021 0721 ?Last data filed at 10/25/2021 0500 ?Gross per 24 hour  ?Intake 2177.34 ml  ?Output 1025 ml  ?Net 1152.34 ml  ? ? ? ?Assessment/Plan:  88 y.o. male is s/p:  ?left common femoral to below knee popliteal bypass with 19m ePTFE  ?1 Day Post-Op ? ? ?-pt with brisk left peroneal doppler signal ?-mobilize pt today ?-he states that he lives with his daughter ?-DVT prophylaxis:  sq heparin ?-continue asa/statin ? ? ?SLeontine Locket PA-C ?[facility] ?[phone]?10/25/2021 ?7:21 AM ? ? ?VASCULAR STAFF ADDENDUM: ?I have independently interviewed and  examined the patient. ?I agree with the above.  ?Looks great POD#1 LCFA-LAKPA bypass with 623mePTFE ?Mobilize as able. Diet as tolerated. ?Anticipate home in next 24 hours. ? ?ThYevonne AlineHaStanford BreedMD ?[facility] [place] Phone Number: ([phone]/05/2022 10:10 AM ? ? ?

## 2021-10-25 NOTE — Consult Note (Signed)
Podiatry Consult Note ? ?To: Dr. Jamelle Haring, Vascular ?Reason for consult: Wound evaluation  ?From: Dr. Landis Martins, Podiatry  ? ?HPI: ?Harold [name] is a 88 y.o. male patient who seen at bedside for evaluation of bilateral foot ulcers. Patient is well known to my outpatient clinic. Patient reports less pain in his extremities and states that he is ready to go home on tomorrow. Denies any other constitutional symptoms at this time.  ? ?Patient Active Problem List  ? Diagnosis Date Noted  ? PAD (peripheral artery disease) ([place]) 10/24/2021  ? Preop cardiovascular exam 03/01/2013  ? Cholecystitis, acute 02/28/2013  ? CAD (coronary artery disease) of artery bypass graft 02/28/2013  ? Diabetes ([place]) 02/28/2013  ? Dyslipidemia 02/28/2013  ? Hypertension 02/28/2013  ? ? ?No current facility-administered medications on file prior to encounter.  ? ?Current Outpatient Medications on File Prior to Encounter  ?Medication Sig Dispense Refill  ? amLODipine (NORVASC) 5 MG tablet Take 5 mg by mouth daily.    ? aspirin EC 81 MG tablet Take 81 mg by mouth daily.    ? Cholecalciferol (VITAMIN D3) 50 MCG (2000 UT) TABS Take 2,000 Units by mouth daily.    ? dorzolamide-timolol (COSOPT) 22.3-6.8 MG/ML ophthalmic solution 1 drop 2 (two) times daily.    ? gabapentin (NEURONTIN) 100 MG capsule Take 100 mg by mouth 3 (three) times daily.    ? latanoprost (XALATAN) 0.005 % ophthalmic solution Place 1 drop into both eyes at bedtime.    ? lidocaine (XYLOCAINE) 2 % jelly Apply 1 application. topically 3 (three) times daily as needed.    ? lidocaine (XYLOCAINE) 5 % ointment 1 application. daily as needed for mild pain or moderate pain.    ? lisinopril-hydrochlorothiazide (ZESTORETIC) 20-25 MG tablet Take 1 tablet by mouth daily.    ? metFORMIN (GLUCOPHAGE) 500 MG tablet Take 500 mg by mouth 2 (two) times daily with a meal.    ? pantoprazole (PROTONIX) 40 MG tablet Take 40 mg by mouth daily.    ? simvastatin (ZOCOR) 80 MG tablet Take 40  mg by mouth at bedtime.    ? ACCU-CHEK AVIVA PLUS test strip     ? Accu-Chek Softclix Lancets lancets SMARTSIG:Topical    ? ? ?No Known Allergies ? ?Past Surgical History:  ?Procedure Laterality Date  ? ABDOMINAL AORTOGRAM W/LOWER EXTREMITY N/A 10/11/2021  ? Procedure: ABDOMINAL AORTOGRAM W/LOWER EXTREMITY;  Surgeon: [name], MD;  Location: [facility];  Service: Cardiovascular;  Laterality: N/A;  Left Lower Extremity  ? BACK SURGERY  1970s  ? CATARACT EXTRACTION W/ INTRAOCULAR LENS IMPLANT Left 2013  ? CHOLECYSTECTOMY N/A 03/02/2013  ? Procedure: LAPAROSCOPIC CHOLECYSTECTOMY;  Surgeon: Rolm Bookbinder, MD;  Location: [facility];  Service: General;  Laterality: N/A;  ? CORONARY ANGIOPLASTY WITH STENT PLACEMENT    ? "had 2-3; they clogged; he later had OHS" (03/01/2013)  ? CORONARY ARTERY BYPASS GRAFT  02/2003  ? "triple" (03/01/2013)  ? FEMORAL-POPLITEAL BYPASS GRAFT Left 10/24/2021  ? Procedure: BYPASS LEFT COMMON FEMORAL-BELOW KNEE POPLITEAL ARTERY WITH 6MM BY 80CM RING GRAFT;  Surgeon: [name], MD;  Location: [facility];  Service: Vascular;  Laterality: Left;  ? ? ?Family History  ?Problem Relation Age of Onset  ? Heart disease Mother   ? Diabetes Mother   ? ? ?Social History  ? ?Socioeconomic History  ? Marital status: Single  ?  Spouse name: Not on file  ? Number of children: Not on file  ?  Years of education: Not on file  ? Highest education level: Not on file  ?Occupational History  ? Not on file  ?Tobacco Use  ? Smoking status: Former  ?  Years: 30.00  ?  Types: Cigarettes  ? Smokeless tobacco: Never  ?Vaping Use  ? Vaping Use: Never used  ?Substance and Sexual Activity  ? Alcohol use: Yes  ?  Comment: 03/01/2013 "drank years ago; never had problem w/it"  ? Drug use: No  ? Sexual activity: Never  ?Other Topics Concern  ? Not on file  ?Social History Narrative  ? Not on file  ? ?Social Determinants of Health  ? ?Financial Resource Strain: Not on file  ?Food Insecurity: Not on file  ?Transportation  Needs: Not on file  ?Physical Activity: Not on file  ?Stress: Not on file  ?Social Connections: Not on file  ?Intimate Partner Violence: Not on file  ? ? ? ?Objective:  ?Today's Vitals  ? 10/25/21 5329 10/25/21 1132 10/25/21 1439 10/25/21 1626  ?BP:  (!) 142/54  (!) 132/43  ?Pulse:  (!) 101  (!) 56  ?Resp:  16  16  ?Temp:  (!) 97.4 ?F (36.3 ?C)  97.7 ?F (36.5 ?C)  ?TempSrc:  Oral  Oral  ?SpO2:  100%  97%  ?Weight:      ?Height:      ?PainSc: 1   5    ? ?Body mass index is 20.67 kg/m?.  ? ?General: Alert and oriented x3 in no acute distress ? ?Dermatology: Skin is warm and dry bilateral with a partial thickness ulceration present medial first MPJ on the left and right medial second toe at the first webspace.  Ulcerations measures approximately 2 x 1.5 cm on the left 1st MTPJ with fibrotic central portion to the base and  0.2 x 0.2 cm on the right 2nd toe with a macerated border with a fibrogranular base. The ulcerations do not probe to bone. There is no malodor, clear to yellow active drainage noted on the left with minimal edema and erythema at the first metatarsal phalangeal joint on the left. ?  ?Vascular: Dorsalis Pedis pulse = 0/4 Bilateral,  Posterior Tibial pulse = 0/4 Bilateral,  Capillary Fill Time < 5 seconds ?  ?Neurologic: Protective diminished bilateral  ?  ?Musculosketal: There is decreased pain to palpation to the wound areas bilateral.  There is significant bunion and hammertoe deformity noted bilateral. ? ? ? ? ?Results for orders placed or performed during the hospital encounter of 10/24/21  ?Surgical pcr screen     Status: Abnormal  ? Collection Time: 10/24/21  9:14 AM  ? Specimen: Nasal Mucosa; Nasal Swab  ?Result Value Ref Range Status  ? MRSA, PCR POSITIVE (A) NEGATIVE Final  ?  Comment: RESULT CALLED TO, READ BACK BY AND VERIFIED WITH: ?RN Donetta Potts [id number] AT 1229 BY CM ?  ? Staphylococcus aureus POSITIVE (A) NEGATIVE Final  ?  Comment: (NOTE) ?The Xpert SA Assay (FDA approved for NASAL  specimens in patients 38 ?years of age and older), is one component of a comprehensive ?surveillance program. It is not intended to diagnose infection nor to ?guide or monitor treatment. ?Performed at [facility], [place] [address], [place], [place] ?[address] ?  ? ? ? ?Assessment and Plan: ?Problem List Items Addressed This Visit   ?None ?Visit Diagnoses   ? ? Atherosclerosis of native arteries of the extremities with ulceration ([place])      ? Relevant Medications  ?  amLODipine (NORVASC) tablet 5 mg  ? aspirin EC tablet 81 mg  ? atorvastatin (LIPITOR) tablet 40 mg  ? labetalol (NORMODYNE) injection 10 mg  ? hydrALAZINE (APRESOLINE) injection 5 mg  ? metoprolol tartrate (LOPRESSOR) injection 2-5 mg  ? heparin injection 5,000 Units  ? lisinopril (ZESTRIL) tablet 20 mg  ? hydrochlorothiazide (HYDRODIURIL) tablet 25 mg  ? Other Relevant Orders  ? APTT (Completed)  ? Blood gas, arterial (Completed)  ? CBC (Completed)  ? Comprehensive metabolic panel (Completed)  ? Protime-INR (Completed)  ? Type and screen (Completed)  ? ?  ?  ? ?-Complete examination performed ?-Wound check performed ?-Rx bactroban to apply once daily to left foot wound covered with dry dressing ?-Patient may continue with dry dressing only at the right 2nd toe ?-Recommend rest and elevation for pain and edema control ?-Weightbearing as tolerated ?-Discussed wounds with patient's daughter. I will place an outpatient wound care center referral to assist with recommendations on wound care ?-Consult appreciated ?-Podiatry to follow outpatient and is stable from podiatry point of view for discharge per vascular team ? ?Dr. Landis Martins, DPM ?[facility] ?[phone] office ?[phone] cell ? ?Time spent with patient for exam and coordination of care: 30   mins  ?

## 2021-10-25 NOTE — Evaluation (Signed)
Physical Therapy Evaluation ?Patient Details ?Name: Harold [name] ?MRN: [id number] ?DOB: May 22, 1932 ?Today's Date: 10/25/2021 ? ?History of Present Illness ? The pt is an 88 yo male presenting 5/11 for L common femoral-popliteal artery bypass. PMH includes: arthritis, CAD, GERD, HLD, MI, DM II, and HTN. ?  ?Clinical Impression ? Pt in bed upon arrival of PT, agreeable to evaluation at this time. Prior to admission the pt was independent with use of cane or no DME without hx of recent falls. The pt was able to complete all ADLs and IADLs independently, but does use stair lift to reach second floor bedroom. The pt now presents with minor limitations in functional mobility, endurance, and stability due to above dx, and will continue to benefit from skilled PT to address these deficits. He was able to complete ~350 ft hallway ambulation with minG and use of SPC. Pt reports he is most limited by incision site pain, had only mild LOB at this time. Anticipate pt will progress well and be safe to return home with family support and no new DME.  ?   ?   ? ?Recommendations for follow up therapy are one component of a multi-disciplinary discharge planning process, led by the attending physician.  Recommendations may be updated based on patient status, additional functional criteria and insurance authorization. ? ?Follow Up Recommendations No PT follow up ? ?  ?Assistance Recommended at Discharge Intermittent Supervision/Assistance  ?Patient can return home with the following ?   ? ?  ?Equipment Recommendations None recommended by PT  ?Recommendations for Other Services ?    ?  ?Functional Status Assessment Patient has had a recent decline in their functional status and demonstrates the ability to make significant improvements in function in a reasonable and predictable amount of time.  ? ?  ?Precautions / Restrictions Precautions ?Precautions: Fall ?Restrictions ?Weight Bearing Restrictions: No  ? ?  ? ?Mobility ? Bed  Mobility ?Overal bed mobility: Needs Assistance ?  ?  ?  ?  ?  ?  ?General bed mobility comments: sitting EOB upon arrival ?  ? ?Transfers ?Overall transfer level: Needs assistance ?Equipment used: Straight cane ?Transfers: Sit to/from Stand ?Sit to Stand: Supervision ?  ?  ?  ?  ?  ?General transfer comment: supervision, no assist given. pt using SPC ?  ? ?Ambulation/Gait ?Ambulation/Gait assistance: Min guard ?Gait Distance (Feet): 350 Feet ?Assistive device: Straight cane ?Gait Pattern/deviations: Step-through pattern, Decreased stride length, Trunk flexed ?Gait velocity: 0.56 m/s ?Gait velocity interpretation: 1.31 - 2.62 ft/sec, indicative of limited community ambulator ?  ?General Gait Details: mild kyphosis, no overt LOB but at times needing minG for mild instability. ?  ? ?Balance Overall balance assessment: Needs assistance ?Sitting-balance support: No upper extremity supported, Single extremity supported, Feet supported ?Sitting balance-Leahy Scale: Good ?  ?  ?Standing balance support: Single extremity supported, During functional activity ?Standing balance-Leahy Scale: Fair ?Standing balance comment: pt able to steady with use of SPC, even with balance challenges such as head turns and quick stops ?  ?  ?  ?  ?  ?  ?  ?  ?  ?  ?  ?   ? ? ? ?Pertinent Vitals/Pain Pain Assessment ?Pain Assessment: Faces ?Faces Pain Scale: Hurts a little bit ?Pain Location: L groin incision ?Pain Descriptors / Indicators: Discomfort, Grimacing, Guarding ?Pain Intervention(s): Limited activity within patient's tolerance, Monitored during session, Repositioned  ? ? ?Home Living Family/patient expects to be discharged to:: Private residence ?Living Arrangements:  Children ?Available Help at Discharge: Family ?Type of Home: House ?Home Access: Stairs to enter ?Entrance Stairs-Rails: Right ?Entrance Stairs-Number of Steps: 1 ?Alternate Level Stairs-Number of Steps: 16 (Has stair lift does not climb or descend stairs) ?Home  Layout: Two level ?Home Equipment: Grab bars - tub/shower;Shower seat - built in;Grab bars - toilet;Cane - single point;Other (comment) (Has a lift chair but its "too slow" so he doesnt use) ?   ?  ?Prior Function Prior Level of Function : Driving;Independent/Modified Independent ?  ?  ?  ?  ?  ?  ?Mobility Comments: Walks with a cane at baseline ?ADLs Comments: Still drives, able to complete all ADLs independently with extra time, lives with his daughter if he needs assist ?  ? ? ?Hand Dominance  ?   ? ?  ?Extremity/Trunk Assessment  ? Upper Extremity Assessment ?Upper Extremity Assessment: Defer to OT evaluation ?  ? ?Lower Extremity Assessment ?Lower Extremity Assessment: LLE deficits/detail ?LLE Deficits / Details: limited by groin incision pain, reports no numbness or tingling. ?LLE: Unable to fully assess due to pain ?  ? ?Cervical / Trunk Assessment ?Cervical / Trunk Assessment: Kyphotic (Minimally)  ?Communication  ? Communication: No difficulties  ?Cognition Arousal/Alertness: Awake/alert ?Behavior During Therapy: [facility] for tasks assessed/performed ?Overall Cognitive Status: Within Functional Limits for tasks assessed ?  ?  ?  ?  ?  ?  ?  ?  ?  ?  ?  ?  ?  ?  ?  ?  ?General Comments: Patient very motivated and eager to participate ?  ?  ? ?  ?General Comments General comments (skin integrity, edema, etc.): VSS with all mobility ? ?  ?   ? ?Assessment/Plan  ?  ?PT Assessment Patient needs continued PT services  ?PT Problem List Decreased strength;Decreased range of motion;Decreased balance;Decreased mobility;Pain ? ?   ?  ?PT Treatment Interventions DME instruction;Gait training;Stair training;Functional mobility training;Therapeutic activities;Therapeutic exercise;Balance training;Patient/family education   ? ?PT Goals (Current goals can be found in the Care Plan section)  ?Acute Rehab PT Goals ?Patient Stated Goal: return home ASAP ?PT Goal Formulation: With patient ?Time For Goal Achievement:  11/08/21 ?Potential to Achieve Goals: Good ? ?  ?Frequency Min 2X/week ?  ? ? ?   ?AM-PAC PT "6 Clicks" Mobility  ?Outcome Measure Help needed turning from your back to your side while in a flat bed without using bedrails?: None ?Help needed moving from lying on your back to sitting on the side of a flat bed without using bedrails?: None ?Help needed moving to and from a bed to a chair (including a wheelchair)?: None ?Help needed standing up from a chair using your arms (e.g., wheelchair or bedside chair)?: None ?Help needed to walk in hospital room?: A Little ?Help needed climbing 3-5 steps with a railing? : A Little ?6 Click Score: 22 ? ?  ?End of Session   ?Activity Tolerance: Patient tolerated treatment well ?Patient left: in chair;with call bell/phone within reach;with chair alarm set;with family/visitor present ?Nurse Communication: Mobility status ?PT Visit Diagnosis: Other abnormalities of gait and mobility (R26.89);Difficulty in walking, not elsewhere classified (R26.2);Pain ?Pain - Right/Left: Left ?Pain - part of body: Leg ?  ? ?Time: 2119-4174 ?PT Time Calculation (min) (ACUTE ONLY): 14 min ? ? ?Charges:   PT Evaluation ?$PT Eval Low Complexity: 1 Low ?  ?  ?   ? ? ?West Carbo, PT, DPT  ? ?Acute Rehabilitation Department ?Pager #: [phone] - [phone] ? ?  Sandra Cockayne ?10/25/2021, 10:48 AM ? ?

## 2021-10-25 NOTE — Progress Notes (Signed)
PHARMACIST LIPID MONITORING ? ? ?Harold [name] is a 88 y.o. male admitted on 10/24/2021 now s/p left common femoral to below knee popliteal bypass.  Pharmacy has been consulted to optimize lipid-lowering therapy with the indication of secondary prevention for clinical ASCVD. ? ?Recent Labs: ? ?Lipid Panel (last 6 months):   ?Lab Results  ?Component Value Date  ? CHOL 136 10/25/2021  ? TRIG 51 10/25/2021  ? HDL 55 10/25/2021  ? CHOLHDL 2.5 10/25/2021  ? VLDL 10 10/25/2021  ? Nunez 71 10/25/2021  ? ? ?Hepatic function panel (last 6 months):   ?Lab Results  ?Component Value Date  ? AST 17 10/24/2021  ? ALT 9 10/24/2021  ? ALKPHOS 32 (L) 10/24/2021  ? BILITOT 0.7 10/24/2021  ? ? ?SCr (since admission):   ?Serum creatinine: 0.98 mg/dL 10/25/21 0241 ?Estimated creatinine clearance: 48.4 mL/min ? ?Current therapy and lipid therapy tolerance ?Current lipid-lowering therapy: atorvastatin '40mg'$  ?Previous lipid-lowering therapies (if applicable): simvastatin ?Documented or reported allergies or intolerances to lipid-lowering therapies (if applicable): none ? ?Assessment:   ?LDL 71 on simvastatin '40mg'$  prior to admit. Statin changed to atorvastatin '40mg'$  during this admit.  ? ?Plan:   ? ?1.Statin intensity (high intensity recommended for all patients regardless of the LDL):  Add or increase statin to high intensity. ? ?2.Add ezetimibe (if any one of the following):   Not indicated at this time. ? ?3.Refer to lipid clinic:   No ? ?4.Follow-up with:  Primary care provider - Josetta Huddle, MD ? ?5.Follow-up labs after discharge:  Changes in lipid therapy were made. Check a lipid panel in 8-12 weeks then annually.    ? ?Erin Hearing PharmD., BCPS ?Clinical Pharmacist ?10/25/2021 9:21 AM ? ?

## 2021-10-25 NOTE — Progress Notes (Signed)
Referral for wound care center placed ?Advised daughter that we will defer care to the wound care center if his appt is prior to 5/25. ? ?Dr. Landis Martins ?[facility] ?[phone] office ? ? ?

## 2021-10-25 NOTE — Evaluation (Signed)
Occupational Therapy Evaluation ?Patient Details ?Name: Harold [name] ?MRN: [id number] ?DOB: 24-Mar-1932 ?Today's Date: 10/25/2021 ? ? ?History of Present Illness The pt is an 88 yo male presenting 5/11 for L common femoral-popliteal artery bypass. PMH includes: arthritis, CAD, GERD, HLD, MI, DM II, and HTN.  ? ?Clinical Impression ?  ?Prior to this admission, patient was living with his daughter, independent with ADLs and IADLs, and still driving. Currently, patient is set up for ADLs, and min guard/min A for initial transfer to bathroom. Patient able to complete self care in standing at sink, and complete dressing at set up level. Provided education to daughter to ensure safety at discharge, with verbal acknowledgement noted. OT will follow acutely to ensure progress, but no follow up OT needed at discharge.  ?   ? ?Recommendations for follow up therapy are one component of a multi-disciplinary discharge planning process, led by the attending physician.  Recommendations may be updated based on patient status, additional functional criteria and insurance authorization.  ? ?Follow Up Recommendations ? No OT follow up  ?  ?Assistance Recommended at Discharge Intermittent Supervision/Assistance  ?Patient can return home with the following A little help with walking and/or transfers;A little help with bathing/dressing/bathroom;Assistance with cooking/housework;Help with stairs or ramp for entrance;Assist for transportation ? ?  ?Functional Status Assessment ? Patient has had a recent decline in their functional status and demonstrates the ability to make significant improvements in function in a reasonable and predictable amount of time.  ?Equipment Recommendations ? None recommended by OT  ?  ?Recommendations for Other Services   ? ? ?  ?Precautions / Restrictions Precautions ?Precautions: Fall ?Restrictions ?Weight Bearing Restrictions: No  ? ?  ? ?Mobility Bed Mobility ?Overal bed mobility: Needs Assistance ?Bed  Mobility: Supine to Sit ?  ?  ?Supine to sit: Supervision ?  ?  ?General bed mobility comments: for safety ?  ? ?Transfers ?Overall transfer level: Needs assistance ?Equipment used: Straight cane ?Transfers: Sit to/from Stand ?Sit to Stand: Min guard ?  ?  ?  ?  ?  ?General transfer comment: min gaurd for minimal unsteadiness with sit<>stand ?  ? ?  ?Balance Overall balance assessment: Needs assistance ?Sitting-balance support: No upper extremity supported, Single extremity supported, Feet supported ?Sitting balance-Leahy Scale: Fair ?  ?  ?Standing balance support: Single extremity supported, During functional activity ?Standing balance-Leahy Scale: Fair ?  ?  ?  ?  ?  ?  ?  ?  ?  ?  ?  ?  ?   ? ?ADL either performed or assessed with clinical judgement  ? ?ADL Overall ADL's : Needs assistance/impaired ?Eating/Feeding: Set up;Sitting ?  ?Grooming: Set up;Standing ?  ?Upper Body Bathing: Set up;Standing ?  ?Lower Body Bathing: Set up;Sit to/from stand ?  ?Upper Body Dressing : Set up;Sitting ?  ?Lower Body Dressing: Set up;Sitting/lateral leans;Sit to/from stand ?  ?Toilet Transfer: Minimal assistance;Ambulation ?Toilet Transfer Details (indicate cue type and reason): min A solely due to initial bout to of walking and minimally unsteady with cane ?Toileting- Clothing Manipulation and Hygiene: Set up ?  ?  ?  ?Functional mobility during ADLs: Min guard;Cane ?General ADL Comments: Patient presentng with minimal pain in L leg with mobility, very eager to participate  ? ? ? ?Vision Baseline Vision/History: 1 Wears glasses (For reading) ?Ability to See in Adequate Light: 0 Adequate ?Patient Visual Report: No change from baseline ?   ?   ?Perception   ?  ?Praxis   ?  ? ?  Pertinent Vitals/Pain Pain Assessment ?Pain Assessment: Faces ?Faces Pain Scale: Hurts a little bit ?Pain Location: L groin incision ?Pain Descriptors / Indicators: Discomfort, Grimacing, Guarding ?Pain Intervention(s): Limited activity within patient's  tolerance, Monitored during session, Premedicated before session  ? ? ? ?Hand Dominance   ?  ?Extremity/Trunk Assessment Upper Extremity Assessment ?Upper Extremity Assessment: Overall WFL for tasks assessed ?  ?Lower Extremity Assessment ?Lower Extremity Assessment: Overall WFL for tasks assessed ?  ?Cervical / Trunk Assessment ?Cervical / Trunk Assessment: Kyphotic (Minimally) ?  ?Communication Communication ?Communication: No difficulties ?  ?Cognition Arousal/Alertness: Awake/alert ?Behavior During Therapy: [facility] for tasks assessed/performed ?Overall Cognitive Status: Within Functional Limits for tasks assessed ?  ?  ?  ?  ?  ?  ?  ?  ?  ?  ?  ?  ?  ?  ?  ?  ?General Comments: Patient very motivated and eager to participate ?  ?  ?General Comments    ? ?  ?Exercises   ?  ?Shoulder Instructions    ? ? ?Home Living Family/patient expects to be discharged to:: Private residence ?Living Arrangements: Children ?Available Help at Discharge: Family ?Type of Home: House ?Home Access: Stairs to enter ?Entrance Stairs-Number of Steps: 1 ?Entrance Stairs-Rails: Right ?Home Layout: Two level ?Alternate Level Stairs-Number of Steps: 16 (Has stair lift does not climb or descend stairs) ?Alternate Level Stairs-Rails:  (Has stair lift does not climb or descend stairs) ?Bathroom Shower/Tub: Walk-in shower ?  ?  ?  ?  ?Home Equipment: Grab bars - tub/shower;Shower seat - built in;Grab bars - toilet;Cane - single point;Other (comment) (Has a lift chair but its "too slow" so he doesnt use) ?  ?  ?  ? ?  ?Prior Functioning/Environment Prior Level of Function : Driving;Independent/Modified Independent ?  ?  ?  ?  ?  ?  ?Mobility Comments: Walks with a cane at baseline ?ADLs Comments: Still drives, able to complete all ADLs independently with extra time, lives with his daughter if he needs assist ?  ? ?  ?  ?OT Problem List: Decreased activity tolerance;Pain;Decreased knowledge of use of DME or AE;Decreased safety awareness ?  ?   ?OT  Treatment/Interventions: Self-care/ADL training;Therapeutic exercise;Energy conservation;DME and/or AE instruction;Manual therapy;Therapeutic activities;Patient/family education;Balance training  ?  ?OT Goals(Current goals can be found in the care plan section) Acute Rehab OT Goals ?Patient Stated Goal: to go home ?OT Goal Formulation: With patient/family ?Time For Goal Achievement: 11/08/21 ?Potential to Achieve Goals: Good ?ADL Goals ?Pt Will Transfer to Toilet: Independently;ambulating ?Additional ADL Goal #1: Patient will verbalize 3 strategies for fall prevention and energy conservation for safe discharge home.  ?OT Frequency: Min 2X/week ?  ? ?Co-evaluation   ?  ?  ?  ?  ? ?  ?AM-PAC OT "6 Clicks" Daily Activity     ?Outcome Measure Help from another person eating meals?: A Little ?Help from another person taking care of personal grooming?: A Little ?Help from another person toileting, which includes using toliet, bedpan, or urinal?: A Little ?Help from another person bathing (including washing, rinsing, drying)?: A Little ?Help from another person to put on and taking off regular upper body clothing?: A Little ?Help from another person to put on and taking off regular lower body clothing?: A Little ?6 Click Score: 18 ?  ?End of Session Equipment Utilized During Treatment: Gait belt;Other (comment) Kasandra Knudsen) ?Nurse Communication: Mobility status ? ?Activity Tolerance: Patient tolerated treatment well ?Patient left: in bed;with call bell/phone within  reach;with family/visitor present (sitting EOB waiting for PT) ? ?OT Visit Diagnosis: Unsteadiness on feet (R26.81);Other abnormalities of gait and mobility (R26.89);Pain ?Pain - Right/Left: Left ?Pain - part of body: Leg  ?              ?Time: 8882-8003 ?OT Time Calculation (min): 21 min ?Charges:  OT General Charges ?$OT Visit: 1 Visit ?OT Evaluation ?$OT Eval Moderate Complexity: 1 Mod ? ?Corinne Ports E. Tonique Mendonca, OTR/L ?Acute Rehabilitation  Services ?[phone] ?[phone]  ? ?Corinne Ports Debi Cousin ?10/25/2021, 10:06 AM ?

## 2021-10-26 LAB — GLUCOSE, CAPILLARY: Glucose-Capillary: 120 mg/dL — ABNORMAL HIGH (ref 70–99)

## 2021-10-26 MED ORDER — OXYCODONE-ACETAMINOPHEN 5-325 MG PO TABS: 1.0000 | ORAL_TABLET | Freq: Four times a day (QID) | ORAL | 0 refills | Status: DC | PRN

## 2021-10-26 NOTE — Progress Notes (Signed)
Pt cane left behind in bathroom after discharge, found by environmental staff.  Called pt's daughter, who said he had two more at home and was okay with [place] to dispose of it. ?

## 2021-10-26 NOTE — Progress Notes (Addendum)
[facility] [place] ? ?Subjective  - doing well, ready to go home ? ? ?Objective ?(!) 140/56 ?75 ?98.1 ?F (36.7 ?C) (Oral) ?15 ?98% ?No intake or output data in the 24 hours ending 10/26/21 0846 ? ?Incisions healing well, groin soft  ?Doppler signals peroneal/DP ? ?  ? ?Assessment/Planning: ?88 y.o. male is s/p: POD# 2 ?left common femoral to below knee popliteal bypass with 61m ePTFE  ? ?Good inflow with brisk peroneal signal ?DPM and wound center follow up arranged ?Independent with walker ?ASA and Statin daily ?F/U with DR. Hawken in 2-3 weeks office will arrange. ? ? ?ERoxy Horseman?10/26/2021 ?8:46 AM ?-- ? ?Laboratory ?Lab Results: ?Recent Labs  ?  10/24/21 ?0161005/12/23 ?0241  ?WBC 7.8 8.7  ?HGB 10.5* 10.9*  ?HCT 34.3* 32.2*  ?PLT 205 235  ? ?BMET ?Recent Labs  ?  10/24/21 ?0960405/12/23 ?0241  ?NA 137 135  ?K 4.0 3.9  ?CL 108 105  ?CO2 17* 22  ?GLUCOSE 94 144*  ?BUN 17 16  ?CREATININE 0.83 0.98  ?CALCIUM 8.4* 8.3*  ? ? ?COAG ?Lab Results  ?Component Value Date  ? INR 1.5 (H) 10/24/2021  ? INR 1.30 03/01/2013  ? INR 1.25 02/28/2013  ? ?No results found for: PTT ? ? ?I have seen and evaluated the patient. I agree with the PA note as documented above.  Status post left common femoral to below-knee popliteal bypass with PTFE.  Brisk peroneal signal in the left foot.  Incisions look good.  Plan discharge today.  Follow-up in 2 to 3 weeks for incision checks.  Discussed aspirin statin for risk reduction.  Podiatry will follow as an outpatient. ? ?CMarty Heck MD ?[facility]?Office: [phone]? ?

## 2021-10-26 NOTE — Progress Notes (Signed)
Discharge education completed. Pt and daughter expressed understanding of post op care, upcoming appts, med summary, etc.  IVs and telemetry removed, CCMD notified. ?

## 2021-10-28 ENCOUNTER — Ambulatory Visit: Payer: PRIVATE HEALTH INSURANCE | Admitting: Podiatry

## 2021-10-28 NOTE — Discharge Summary (Signed)
[facility] Discharge Summary ? ? ?Patient ID:  ?Harold [name] ?MRN: [id number] ?DOB/AGE: 86-Sep-1933 86 y.o. ? ?Admit date: 10/24/2021 ?Discharge date: 10/26/21 ?Date of Surgery: 10/24/2021 ?Surgeon: Surgeon(s): ?[name], MD ?Waynetta Sandy, MD ? ?Admission Diagnosis: ?PAD (peripheral artery disease) ([place]) [I73.9] ? ?Discharge Diagnoses:  ?PAD (peripheral artery disease) ([place]) [I73.9] ? ?Secondary Diagnoses: ?Past Medical History:  ?Diagnosis Date  ? Arthritis   ? "generalized" (03/01/2013)  ? CAD (coronary artery disease)   ? Cataract   ? GERD (gastroesophageal reflux disease)   ? HOH (hard of hearing)   ? Hyperlipidemia   ? Myocardial infarction ([place]) 06/16/2002  ? PONV (postoperative nausea and vomiting)   ? Type II diabetes mellitus ([place])   ? Unspecified essential hypertension   ? ? ?Procedure(s): ?BYPASS LEFT COMMON FEMORAL-BELOW KNEE POPLITEAL ARTERY WITH 6MM BY 80CM RING GRAFT ? ?Discharged Condition: good ? ?HPI: Harold [name] is a 88 y.o. male with PAD and non healing left foot wound.  He has been under the care of Dr. Cannon Kettle, has been doing meticulous wound care.   His ABI's suggested left LE arterial disease with an index of 0.3 TBI and non compressible arteries.   ? ? ?Hospital Course:  ?Harold [name] is a 88 y.o. male is S/P  ?Procedure(s): ?BYPASS LEFT COMMON FEMORAL-BELOW KNEE POPLITEAL ARTERY WITH 6MM BY 80CM RING GRAFT ?Incisions were healing nicely, mobility is stable and pain was well controlled.  The foot wound is dry without signs of infection at discharge.  Brisk peroneal doppler inflow left LE.  He will continue asa/statin.  He was discharge home under the care of his family post op day #2.   ? ?Consults:  ?Treatment Team:  ?Waynetta Sandy, MD ? ?Significant Diagnostic Studies: ?CBC ?Lab Results  ?Component Value Date  ? WBC 8.7 10/25/2021  ? HGB 10.9 (L) 10/25/2021  ? HCT 32.2 (L) 10/25/2021  ? MCV 92.5 10/25/2021  ? PLT 235  10/25/2021  ? ? ?BMET ?   ?Component Value Date/Time  ? NA 135 10/25/2021 0241  ? K 3.9 10/25/2021 0241  ? CL 105 10/25/2021 0241  ? CO2 22 10/25/2021 0241  ? GLUCOSE 144 (H) 10/25/2021 0241  ? BUN 16 10/25/2021 0241  ? CREATININE 0.98 10/25/2021 0241  ? CALCIUM 8.3 (L) 10/25/2021 0241  ? GFRNONAA >60 10/25/2021 0241  ? GFRAA >90 03/03/2013 0447  ? ?COAG ?Lab Results  ?Component Value Date  ? INR 1.5 (H) 10/24/2021  ? INR 1.30 03/01/2013  ? INR 1.25 02/28/2013  ? ? ? ?Disposition:  ?Discharge to :Home ?Discharge Instructions   ? ? Call MD for:  redness, tenderness, or signs of infection (pain, swelling, bleeding, redness, odor or green/yellow discharge around incision site)   Complete by: As directed ?  ? Call MD for:  severe or increased pain, loss or decreased feeling  in affected limb(s)   Complete by: As directed ?  ? Call MD for:  temperature >100.5   Complete by: As directed ?  ? Resume previous diet   Complete by: As directed ?  ? ?  ? ?Allergies as of 10/26/2021   ?No Known Allergies ?  ? ?  ?Medication List  ?  ? ?TAKE these medications   ? ?Accu-Chek Aviva Plus test strip ?Generic drug: glucose blood ?  ?Accu-Chek Softclix Lancets lancets ?SMARTSIG:Topical ?  ?amLODipine 5 MG tablet ?Commonly known as: NORVASC ?Take 5 mg by mouth daily. ?  ?aspirin  EC 81 MG tablet ?Take 81 mg by mouth daily. ?  ?dorzolamide-timolol 22.3-6.8 MG/ML ophthalmic solution ?Commonly known as: COSOPT ?1 drop 2 (two) times daily. ?  ?gabapentin 100 MG capsule ?Commonly known as: NEURONTIN ?Take 100 mg by mouth 3 (three) times daily. ?  ?latanoprost 0.005 % ophthalmic solution ?Commonly known as: XALATAN ?Place 1 drop into both eyes at bedtime. ?  ?lidocaine 2 % jelly ?Commonly known as: XYLOCAINE ?Apply 1 application. topically 3 (three) times daily as needed. ?  ?lidocaine 5 % ointment ?Commonly known as: XYLOCAINE ?1 application. daily as needed for mild pain or moderate pain. ?  ?lisinopril-hydrochlorothiazide 20-25 MG  tablet ?Commonly known as: ZESTORETIC ?Take 1 tablet by mouth daily. ?  ?metFORMIN 500 MG tablet ?Commonly known as: GLUCOPHAGE ?Take 500 mg by mouth 2 (two) times daily with a meal. ?  ?oxyCODONE-acetaminophen 5-325 MG tablet ?Commonly known as: PERCOCET/ROXICET ?Take 1 tablet by mouth every 6 (six) hours as needed for moderate pain. ?  ?pantoprazole 40 MG tablet ?Commonly known as: PROTONIX ?Take 40 mg by mouth daily. ?  ?simvastatin 80 MG tablet ?Commonly known as: ZOCOR ?Take 40 mg by mouth at bedtime. ?  ?Vitamin D3 50 MCG (2000 UT) Tabs ?Take 2,000 Units by mouth daily. ?  ? ?  ? ?Verbal and written Discharge instructions given to the patient. Wound care per Discharge AVS ? Follow-up Information   ? ? [facility] Follow up in 3 week(s).   ?Specialty: Vascular Surgery ?Why: Office will call you to arrange your appt (sent) ?Contact information: ?[address] ?[place] ?[phone] ? ?  ?  ? ? [facility] Follow up.   ?Specialty: Home Health Services ?Why: pre-op referral for [place] needs- they will contact you post discharge to f/u and schedule home visits ?Contact information: ?[address] ?[place] [address] ?[phone] ? ? ?  ?  ? ?  ?  ? ?  ? ? ?Signed: ?Roxy Horseman ?10/28/2021, 12:22 PM ?- For [facility] Registry use --- ?Instructions: Press F2 to tab through selections.  Delete question if not applicable.  ? ?Post-op:  ?Wound infection: No  ?Graft infection: No  ?Transfusion: No  If yes, 0 units given ?New Arrhythmia: No ?Ipsilateral amputation: [ x] no, '[ ]'$  Minor, '[ ]'$  BKA, '[ ]'$  AKA ?Discharge patency: [x ] Primary, '[ ]'$  Primary assisted, '[ ]'$  Secondary, '[ ]'$  Occluded ?Patency judged by: [ x] Dopper only, '[ ]'$  Palpable graft pulse, '[ ]'$  Palpable distal pulse, '[ ]'$  ABI inc. > 0.15, '[ ]'$  Duplex ? ?D/C Ambulatory Status: Ambulatory ? ?Complications:n ? ?MI: [x ] No, '[ ]'$  Troponin only, '[ ]'$  EKG or Clinical ?CHF: No ?Resp failure: [x ] none, [  ] Pneumonia, '[ ]'$  Ventilator ?Chg in renal function: '[ ]'$  none, '[ ]'$  Inc. Cr > 0.5, '[ ]'$  Temp. Dialysis, '[ ]'$  Permanent dialysis ?Stroke: [ x] None, '[ ]'$  Minor, '[ ]'$  Major ?Return to OR: No  ?Reason for return to OR: '[ ]'$  Bleeding, '[ ]'$  Infection, '[ ]'$  Thrombosis, '[ ]'$  Revision ? ?Discharge medications: ?Statin use:  Yes ?ASA use:  Yes ?Plavix use:  No  for medical reason not indicated ?Beta blocker use: No  for medical reason not indicated ?Coumadin use: No  for medical reason not indicated ? ?

## 2021-10-29 ENCOUNTER — Telehealth: Payer: Self-pay

## 2021-10-29 DIAGNOSIS — Z48812 Encounter for surgical aftercare following surgery on the circulatory system: Secondary | ICD-10-CM | POA: Diagnosis not present

## 2021-10-29 DIAGNOSIS — E785 Hyperlipidemia, unspecified: Secondary | ICD-10-CM | POA: Diagnosis not present

## 2021-10-29 DIAGNOSIS — E119 Type 2 diabetes mellitus without complications: Secondary | ICD-10-CM | POA: Diagnosis not present

## 2021-10-29 DIAGNOSIS — I739 Peripheral vascular disease, unspecified: Secondary | ICD-10-CM | POA: Diagnosis not present

## 2021-10-29 DIAGNOSIS — L89892 Pressure ulcer of other site, stage 2: Secondary | ICD-10-CM | POA: Diagnosis not present

## 2021-10-29 DIAGNOSIS — Z7984 Long term (current) use of oral hypoglycemic drugs: Secondary | ICD-10-CM | POA: Diagnosis not present

## 2021-10-29 DIAGNOSIS — I2510 Atherosclerotic heart disease of native coronary artery without angina pectoris: Secondary | ICD-10-CM | POA: Diagnosis not present

## 2021-10-29 DIAGNOSIS — I10 Essential (primary) hypertension: Secondary | ICD-10-CM | POA: Diagnosis not present

## 2021-10-29 DIAGNOSIS — K219 Gastro-esophageal reflux disease without esophagitis: Secondary | ICD-10-CM | POA: Diagnosis not present

## 2021-10-29 DIAGNOSIS — M1380 Other specified arthritis, unspecified site: Secondary | ICD-10-CM | POA: Diagnosis not present

## 2021-10-29 NOTE — Telephone Encounter (Signed)
Pt's Harold [name] called to let us know pt is having some increased swelling and she has encouraged him to elevate his leg. Pt's daughter is a Marine scientist and only wanted [place] to come out twice this week and not 3 x. Pt sounds to be doing well overall and they will call us if anything changes.  ?

## 2021-10-31 DIAGNOSIS — I10 Essential (primary) hypertension: Secondary | ICD-10-CM | POA: Diagnosis not present

## 2021-10-31 DIAGNOSIS — E785 Hyperlipidemia, unspecified: Secondary | ICD-10-CM | POA: Diagnosis not present

## 2021-10-31 DIAGNOSIS — L89892 Pressure ulcer of other site, stage 2: Secondary | ICD-10-CM | POA: Diagnosis not present

## 2021-10-31 DIAGNOSIS — M1380 Other specified arthritis, unspecified site: Secondary | ICD-10-CM | POA: Diagnosis not present

## 2021-10-31 DIAGNOSIS — I2510 Atherosclerotic heart disease of native coronary artery without angina pectoris: Secondary | ICD-10-CM | POA: Diagnosis not present

## 2021-10-31 DIAGNOSIS — K219 Gastro-esophageal reflux disease without esophagitis: Secondary | ICD-10-CM | POA: Diagnosis not present

## 2021-10-31 DIAGNOSIS — Z48812 Encounter for surgical aftercare following surgery on the circulatory system: Secondary | ICD-10-CM | POA: Diagnosis not present

## 2021-10-31 DIAGNOSIS — E119 Type 2 diabetes mellitus without complications: Secondary | ICD-10-CM | POA: Diagnosis not present

## 2021-10-31 DIAGNOSIS — Z7984 Long term (current) use of oral hypoglycemic drugs: Secondary | ICD-10-CM | POA: Diagnosis not present

## 2021-10-31 DIAGNOSIS — I739 Peripheral vascular disease, unspecified: Secondary | ICD-10-CM | POA: Diagnosis not present

## 2021-11-01 NOTE — Progress Notes (Addendum)
Harold [name], Harold [name] ([id number]) Visit Report for 11/04/2021 Allergy List Details Patient Name: Date of Service: Harold [name] [name] 11/04/2021 9:45 A M Medical Record Number: [id number] Patient Account Number: [id number] Date of Birth/Sex: Treating RN: Harold [name] Primary Care Harold [name]: Harold [name] Other Clinician: Referring Harold [name]: Treating Harold [name]: Harold [name]: 0 Allergies Active Allergies No Known Allergies Allergy Notes Electronic Signature(s) Signed: 11/01/2021 12:33:29 PM By: Harold [name] CHT EMT BS , , Entered By: Harold [name] on 11/01/2021 12:33:29 -------------------------------------------------------------------------------- Arrival Information Details Patient Name: Date of Service: Harold [name], Harold [name] 11/04/2021 9:45 A M Medical Record Number: [id number] Patient Account Number: [id number] Date of Birth/Sex: Treating RN: 1931-07-20 (88 y.o. Harold [name] Primary Care Harold [name]: Harold [name] Other Clinician: Referring Harold [name]: Treating Harold [name]: Harold [name]: 0 Visit Information Patient Arrived: Harold [name] Time: 09:42 Accompanied By: daughter Transfer Assistance: None Patient Identification Verified: Yes Secondary Verification Process Completed: Yes Patient Has Alerts: Yes Patient Alerts: R ABI [place] R TBI: 0.53 L ABI  L TBI: 0.30 Electronic Signature(s) Signed: 11/04/2021 5:26:25 PM By: Harold Hurst RN, [name] Entered By: Harold [name] on 11/04/2021 09:47:36 -------------------------------------------------------------------------------- Clinic Level of Care Assessment Details Patient Name: Date of Service: Harold [name] 11/04/2021 9:45 A M Medical Record Number: [id number] Patient Account Number: [id number] Date of Birth/Sex: Treating RN: Jul 15, 1931 (88 y.o. Harold [name] Primary Care Harold [name]: Harold [name] Other Clinician: Referring Harold [name]: Treating Harold [name]/Extender: Harold [name]: 0 Clinic Level of Care Assessment Items TOOL 1 Quantity Score X- 1 0 Use when EandM and Procedure is performed on INITIAL visit ASSESSMENTS - Nursing Assessment / Reassessment X- 1 20 General Physical Exam (combine w/ comprehensive assessment (listed just below) when performed on new pt. evals) X- 1 25 Comprehensive Assessment (HX, ROS, Risk Assessments, Wounds Hx, etc.) ASSESSMENTS - Wound and Skin Assessment / Reassessment '[]'$  - 0 Dermatologic / Skin Assessment (not related to wound area) ASSESSMENTS - Ostomy and/or Continence Assessment and Care '[]'$  - 0 Incontinence Assessment and Management '[]'$  - 0 Ostomy Care Assessment and Management (repouching, etc.) PROCESS - Coordination of Care X - Simple Patient / Family Education for ongoing care 1 15 '[]'$  - 0 Complex (extensive) Patient / Family Education for ongoing care X- 1 10 Staff obtains Programmer, systems, Records, T Results / Process Orders est X- 1 10 Staff telephones HHA, Nursing Homes / Clarify orders / etc '[]'$  - 0 Routine Transfer to another Facility (non-emergent condition) '[]'$  - 0 Routine Hospital Admission (non-emergent condition) X- 1 15 New Admissions / Biomedical engineer / Ordering NPWT Apligraf, etc. , '[]'$  - 0 Emergency Hospital Admission (emergent condition) PROCESS - Special Needs '[]'$  - 0 Pediatric / Minor Patient Management '[]'$  - 0 Isolation Patient Management '[]'$  - 0 Hearing / Language / Visual special needs '[]'$  - 0 Assessment of Community assistance (transportation, D/C planning, etc.) '[]'$  - 0 Additional assistance / Altered mentation '[]'$  - 0 Support Surface(s) Assessment (bed, cushion, seat, etc.) INTERVENTIONS - Miscellaneous '[]'$  - 0 External ear exam '[]'$  - 0 Patient Transfer (multiple staff / Civil Service fast streamer / Similar devices) '[]'$  -  0 Simple Staple / Suture removal (25 or less) '[]'$  - 0 Complex Staple / Suture removal (26 or more) '[]'$  - 0 Hypo/Hyperglycemic Management (do not check if billed separately) '[]'$  - 0 Ankle / Brachial Index (ABI) - do not  check if billed separately Has the patient been seen at the hospital within the last three years: Yes Total Score: 95 Level Of Care: New/Established - Level 3 Electronic Signature(s) Signed: 11/04/2021 5:26:25 PM By: Harold Hurst RN, [name] Entered By: Harold [name] on 11/04/2021 10:23:34 -------------------------------------------------------------------------------- Encounter Discharge Information Details Patient Name: Date of Service: Harold [address] [name] 11/04/2021 9:45 A M Medical Record Number: [id number] Patient Account Number: [id number] Date of Birth/Sex: Treating RN: 1932/06/16 (88 y.o. Harold [name] Primary Care Geanna Divirgilio: Harold [name] Other Clinician: Referring Abaigeal Moomaw: Treating Harold [name]/Extender: Harold [name]: 0 Encounter Discharge Information Items Post Procedure Vitals Discharge Condition: Stable Temperature (F): 97.9 Ambulatory Status: Cane Pulse (bpm): 76 Discharge Destination: Home Respiratory Rate (breaths/min): 18 Transportation: Private Auto Blood Pressure (mmHg): 132/75 Accompanied By: daughter Schedule Follow-up Appointment: Yes Clinical Summary of Care: Patient Declined Electronic Signature(s) Signed: 11/04/2021 5:26:25 PM By: Harold Hurst RN, [name] Entered By: Harold [name] on 11/04/2021 14:23:23 -------------------------------------------------------------------------------- Lower Extremity Assessment Details Patient Name: Date of Service: Harold [name] [name] 11/04/2021 9:45 A M Medical Record Number: [id number] Patient Account Number: [id number] Date of Birth/Sex: Treating RN: Harold [name] Primary Care Brittan Butterbaugh: Harold [name] Other  Clinician: Referring Adamarie Izzo: Treating Alexios Keown/Extender: Harold [name]: 0 Edema Assessment Assessed: [Left: No] [Right: No] Edema: [Left: Ye] [Right: s] Calf Left: Right: Point of Measurement: From Medial Instep 35.4 cm Ankle Left: Right: Point of Measurement: From Medial Instep 22.9 cm Knee To Floor Left: Right: From Medial Instep 48 cm Vascular Assessment Pulses: Dorsalis Pedis Palpable: [Left:Yes] Electronic Signature(s) Signed: 11/04/2021 5:26:25 PM By: Harold Hurst RN, [name] Entered By: Harold [name] on 11/04/2021 10:03:49 -------------------------------------------------------------------------------- Multi Wound Chart Details Patient Name: Date of Service: Harold [address] [name] 11/04/2021 9:45 A M Medical Record Number: [id number] Patient Account Number: [id number] Date of Birth/Sex: Treating RN: Oct 13, 1931 (88 y.o. Harold [name] Primary Care Adaora Mchaney: Harold [name] Other Clinician: Referring Joplin Canty: Treating Farin Buhman/Extender: Harold [name]: 0 Vital Signs Height(in): 71 Capillary Blood Glucose(mg/dl): 130 Weight(lbs): 135 Pulse(bpm): 76 Body Mass Index(BMI): 18.8 Blood Pressure(mmHg): 132/75 Temperature(F): 97.9 Respiratory Rate(breaths/min): 18 Photos: [N/A:N/A] Left, Medial Foot N/A N/A Wound Location: Pressure Injury N/A N/A Wounding Event: Diabetic Wound/Ulcer of the Lower N/A N/A Primary Etiology: Extremity Coronary Artery Disease, N/A N/A Comorbid History: Hypertension, Myocardial Infarction, Peripheral Arterial Disease, Type II Diabetes 09/10/2021 N/A N/A Date Acquired: 0 N/A N/A Weeks of [name]: Open N/A N/A Wound Status: No N/A N/A Wound Recurrence: 2.7x2.7x0.1 N/A N/A Measurements L x W x D (cm) 5.726 N/A N/A A (cm) : rea 0.573 N/A N/A Volume (cm) : Unable to visualize wound bed N/A N/A Classification: Medium N/A  N/A Exudate A mount: Flat and Intact N/A N/A Wound Margin: None Present (0%) N/A N/A Granulation A mount: Large (67-100%) N/A N/A Necrotic A mount: Eschar, Adherent Slough N/A N/A Necrotic Tissue: Fascia: No N/A N/A Exposed Structures: Fat Layer (Subcutaneous Tissue): No Tendon: No Muscle: No Joint: No Bone: No None N/A N/A Epithelialization: [name] Notes Electronic Signature(s) Signed: 11/04/2021 10:55:53 AM By: Kalman Shan DO Signed: 11/05/2021 4:34:51 PM By: Deon Pilling RN, [name] Entered By: Kalman Shan on 11/04/2021 10:12:03 -------------------------------------------------------------------------------- [place] Details Patient Name: Date of Service: Harold [name], Harold [name] 11/04/2021 9:45 A M Medical Record Number: [id number] Patient Account Number: [id number] Date of Birth/Sex: Treating RN: 1931-08-12 (Harold y.o.  Harold [name] Primary Care Catalia Massett: Harold [name] Other Clinician: Referring Bellamarie Pflug: Treating Lorynn Moeser/Extender: Adline Mango in [name]: 0 Multidisciplinary Care Plan reviewed with physician Active Inactive Abuse / Safety / Falls / Self Care Management Nursing Diagnoses: Potential for falls Potential for injury related to falls Goals: Patient will not experience any injury related to falls Date Initiated: 11/04/2021 Target Resolution Date: 12/06/2021 Goal Status: Active Patient/caregiver will verbalize/demonstrate measures taken to prevent injury and/or falls Date Initiated: 11/04/2021 Target Resolution Date: 12/06/2021 Goal Status: Active Interventions: Assess Activities of Daily Living upon admission and as needed Assess fall risk on admission and as needed Assess: immobility, friction, shearing, incontinence upon admission and as needed Assess impairment of mobility on admission and as needed per policy Assess personal safety and home safety (as indicated) on admission  and as needed Provide education on fall prevention Provide education on personal and home safety Notes: Nutrition Nursing Diagnoses: Impaired glucose control: actual or potential Goals: Patient/caregiver will maintain therapeutic glucose control Date Initiated: 11/04/2021 Target Resolution Date: 12/06/2021 Goal Status: Active Interventions: Assess HgA1c results as ordered upon admission and as needed Assess patient nutrition upon admission and as needed per policy Provide education on elevated blood sugars and impact on wound healing Notes: Wound/Skin Impairment Nursing Diagnoses: Impaired tissue integrity Knowledge deficit related to ulceration/compromised skin integrity Goals: Patient/caregiver will verbalize understanding of skin care regimen Date Initiated: 11/04/2021 Target Resolution Date: 12/06/2021 Goal Status: Active Ulcer/skin breakdown will have a volume reduction of 30% by week 4 Date Initiated: 11/04/2021 Target Resolution Date: 12/06/2021 Goal Status: Active Interventions: Assess patient/caregiver ability to obtain necessary supplies Assess patient/caregiver ability to perform ulcer/skin care regimen upon admission and as needed Assess ulceration(s) every visit Provide education on ulcer and skin care Notes: Electronic Signature(s) Signed: 11/04/2021 5:26:25 PM By: Harold Hurst RN, [name] Entered By: Harold [name] on 11/04/2021 10:22:11 -------------------------------------------------------------------------------- Pain Assessment Details Patient Name: Date of Service: Harold [name] [name] 11/04/2021 9:45 A M Medical Record Number: [id number] Patient Account Number: [id number] Date of Birth/Sex: Treating RN: Feb 03, 1932 (88 y.o. Harold [name] Primary Care Shaden Lacher: Harold [name] Other Clinician: Referring Vernestine Brodhead: Treating Oluwanifemi Petitti/Extender: Harold [name]: 0 Active Problems Location of Pain Severity  and Description of Pain Patient Has Paino No Site Locations Pain Management and Medication Current Pain Management: Electronic Signature(s) Signed: 11/04/2021 5:26:25 PM By: Harold Hurst RN, [name] Entered By: Harold [name] on 11/04/2021 10:03:14 -------------------------------------------------------------------------------- Patient/Caregiver Education Details Patient Name: Date of Service: [address] 5/22/2023andnbsp9:45 A M Medical Record Number: [id number] Patient Account Number: [id number] Date of Birth/Gender: Treating RN: 1931/12/31 (88 y.o. Harold [name] Primary Care Physician: Harold [name] Other Clinician: Referring Physician: Treating Physician/Extender: Adline Mango in [name]: 0 Education Assessment Education Provided To: Patient Education Topics Provided Elevated Blood Sugar/ Impact on Healing: Methods: Explain/Verbal Responses: State content correctly Safety: Methods: Explain/Verbal Responses: State content correctly Wound/Skin Impairment: Methods: Explain/Verbal Responses: State content correctly Electronic Signature(s) Signed: 11/04/2021 5:26:25 PM By: Harold Hurst RN, [name] Entered By: Harold [name] on 11/04/2021 10:22:24 -------------------------------------------------------------------------------- Wound Assessment Details Patient Name: Date of Service: Harold [name] [name] 11/04/2021 9:45 A M Medical Record Number: [id number] Patient Account Number: [id number] Date of Birth/Sex: Treating RN: 1931-12-07 (88 y.o. Harold [name] Primary Care Johana Hopkinson: Harold [name] Other Clinician: Referring Joeziah Voit: Treating Lemoyne Scarpati/Extender: Harold [name]: 0 Wound Status Wound Number:  1 Primary Diabetic Wound/Ulcer of the Lower Extremity Etiology: Wound Location: Left, Medial Foot Wound Open Wounding Event: Pressure Injury Status: Date  Acquired: 09/10/2021 Comorbid Coronary Artery Disease, Hypertension, Myocardial Infarction, Weeks Of [name]: 0 History: Peripheral Arterial Disease, Type II Diabetes Clustered Wound: No Photos Wound Measurements Length: (cm) 2.7 Width: (cm) 2.7 Depth: (cm) 0.1 Area: (cm) 5.726 Volume: (cm) 0.573 Wound Description Classification: Unable to visualize wound bed Wound Margin: Flat and Intact Exudate Amount: Medium Foul Odor After Cleansing: No Slough/Fibrino Yes % Reduction in Area: % Reduction in Volume: Epithelialization: None Tunneling: No Undermining: No Wound Bed Granulation Amount: None Present (0%) Exposed Structure Necrotic Amount: Large (67-100%) Fascia Exposed: No Necrotic Quality: Eschar, Adherent Slough Fat Layer (Subcutaneous Tissue) Exposed: No Tendon Exposed: No Muscle Exposed: No Joint Exposed: No Bone Exposed: No Electronic Signature(s) Signed: 11/05/2021 4:34:51 PM By: Deon Pilling RN, [name] Signed: 11/26/2021 8:46:54 AM By: Erenest Blank Entered By: Erenest Blank on 11/04/2021 09:59:58 -------------------------------------------------------------------------------- Vitals Details Patient Name: Date of Service: Harold [name] [name] 11/04/2021 9:45 A M Medical Record Number: [id number] Patient Account Number: [id number] Date of Birth/Sex: Treating RN: 1931/08/10 (87 y.o. Harold [name] Primary Care Lam Bjorklund: Harold [name] Other Clinician: Referring Hilberto Burzynski: Treating Tilda Samudio/Extender: Harold [name]: 0 Vital Signs Time Taken: 09:47 Temperature (F): 97.9 Height (in): 71 Pulse (bpm): 76 Source: Stated Respiratory Rate (breaths/min): 18 Weight (lbs): 135 Blood Pressure (mmHg): 132/75 Source: Stated Capillary Blood Glucose (mg/dl): 130 Body Mass Index (BMI): 18.8 Reference Range: 80 - 120 mg / dl Notes glucose per pt report Electronic Signature(s) Signed: 11/04/2021 5:26:25 PM By:  Harold Hurst RN, [name] Entered By: Harold [name] on 11/04/2021 09:49:04

## 2021-11-04 ENCOUNTER — Other Ambulatory Visit: Payer: Self-pay

## 2021-11-04 ENCOUNTER — Encounter (HOSPITAL_BASED_OUTPATIENT_CLINIC_OR_DEPARTMENT_OTHER): Payer: Medicare Other | Attending: Internal Medicine | Admitting: Internal Medicine

## 2021-11-04 ENCOUNTER — Ambulatory Visit
Admission: RE | Admit: 2021-11-04 | Discharge: 2021-11-04 | Disposition: A | Discharge status: Home / Self Care | Payer: Medicare Other | Source: Ambulatory Visit | Attending: Sports Medicine | Admitting: Sports Medicine

## 2021-11-04 DIAGNOSIS — I70245 Atherosclerosis of native arteries of left leg with ulceration of other part of foot: Secondary | ICD-10-CM | POA: Insufficient documentation

## 2021-11-04 DIAGNOSIS — E11621 Type 2 diabetes mellitus with foot ulcer: Secondary | ICD-10-CM | POA: Insufficient documentation

## 2021-11-04 DIAGNOSIS — I10 Essential (primary) hypertension: Secondary | ICD-10-CM | POA: Insufficient documentation

## 2021-11-04 DIAGNOSIS — I2510 Atherosclerotic heart disease of native coronary artery without angina pectoris: Secondary | ICD-10-CM | POA: Diagnosis not present

## 2021-11-04 DIAGNOSIS — L97508 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 with other specified severity: Secondary | ICD-10-CM | POA: Insufficient documentation

## 2021-11-04 DIAGNOSIS — Z87891 Personal history of nicotine dependence: Secondary | ICD-10-CM | POA: Diagnosis not present

## 2021-11-04 DIAGNOSIS — E785 Hyperlipidemia, unspecified: Secondary | ICD-10-CM | POA: Diagnosis not present

## 2021-11-04 DIAGNOSIS — E119 Type 2 diabetes mellitus without complications: Secondary | ICD-10-CM | POA: Diagnosis not present

## 2021-11-04 DIAGNOSIS — Z48812 Encounter for surgical aftercare following surgery on the circulatory system: Secondary | ICD-10-CM | POA: Diagnosis not present

## 2021-11-04 DIAGNOSIS — I739 Peripheral vascular disease, unspecified: Secondary | ICD-10-CM | POA: Diagnosis not present

## 2021-11-04 DIAGNOSIS — K219 Gastro-esophageal reflux disease without esophagitis: Secondary | ICD-10-CM | POA: Diagnosis not present

## 2021-11-04 DIAGNOSIS — L97509 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 with unspecified severity: Secondary | ICD-10-CM | POA: Diagnosis not present

## 2021-11-04 DIAGNOSIS — E1151 Type 2 diabetes mellitus with diabetic peripheral angiopathy without gangrene: Secondary | ICD-10-CM | POA: Diagnosis not present

## 2021-11-04 DIAGNOSIS — L89892 Pressure ulcer of other site, stage 2: Secondary | ICD-10-CM | POA: Diagnosis not present

## 2021-11-04 DIAGNOSIS — Z7984 Long term (current) use of oral hypoglycemic drugs: Secondary | ICD-10-CM | POA: Diagnosis not present

## 2021-11-04 DIAGNOSIS — M1380 Other specified arthritis, unspecified site: Secondary | ICD-10-CM | POA: Diagnosis not present

## 2021-11-04 NOTE — Progress Notes (Signed)
LENFORD, BEDDOW ([id number]) Visit Report for 11/04/2021 Chief Complaint Document Details Patient Name: Date of Service: Harold [name] [name] 11/04/2021 9:45 A M Medical Record Number: [id number] Patient Account Number: [id number] Date of Birth/Sex: Treating RN: January 01, 1932 (88 y.o. [name] Primary Care Provider: [name] Other Clinician: Referring Provider: Treating Provider/Extender: [name] in Treatment: 0 Information Obtained from: Patient Chief Complaint 11/04/2021; left foot wound Electronic Signature(s) Signed: 11/04/2021 10:55:53 AM By: Kalman Shan DO Entered By: Kalman Shan on 11/04/2021 10:12:26 -------------------------------------------------------------------------------- Debridement Details Patient Name: Date of Service: [address] [name] 11/04/2021 9:45 A M Medical Record Number: [id number] Patient Account Number: [id number] Date of Birth/Sex: Treating RN: 1931-10-20 (88 y.o. [name] Primary Care Provider: [name] Other Clinician: Referring Provider: Treating Provider/Extender: [name] in Treatment: 0 Debridement Performed for Assessment: Wound #1 Left,Medial Foot Performed By: Clinician Levan Hurst, RN Debridement Type: Chemical/Enzymatic/Mechanical Agent Used: Santyl Severity of Tissue Pre Debridement: Fat layer exposed Level of Consciousness (Pre-procedure): Awake and Alert Pre-procedure Verification/Time Out Yes - 10:30 Taken: Start Time: 10:30 Bleeding: None End Time: 10:30 Procedural Pain: 0 Post Procedural Pain: 0 Response to Treatment: Procedure was tolerated well Level of Consciousness (Post- Awake and Alert procedure): Post Debridement Measurements of Total Wound Length: (cm) 2.7 Width: (cm) 2.7 Depth: (cm) 0.1 Volume: (cm) 0.573 Character of Wound/Ulcer Post Debridement: Requires Further Debridement Severity of  Tissue Post Debridement: Fat layer exposed Post Procedure Diagnosis Same as Pre-procedure Electronic Signature(s) Signed: 11/04/2021 10:55:53 AM By: Kalman Shan DO Signed: 11/04/2021 5:26:25 PM By: [name], [name] Entered By: Levan Hurst on 11/04/2021 10:30:14 -------------------------------------------------------------------------------- HPI Details Patient Name: Date of Service: [address] [name] 11/04/2021 9:45 A M Medical Record Number: [id number] Patient Account Number: [id number] Date of Birth/Sex: Treating RN: 03-Mar-1932 (88 y.o. [name] Primary Care Provider: [name] Other Clinician: Referring Provider: Treating Provider/Extender: [name] in Treatment: 0 History of Present Illness HPI Description: Admission 11/04/2021 [name] is an 88 year old male with a past medical history of peripheral arterial disease, type 2 diabetes controlled on oral agents, former tobacco user that presents to the clinic for a medial left foot wound. He states that the wound started in March 2023. He noticed his foot was rubbing against in inside of his shoe creating a wound. He currently wears soft slippers to address this issue. He has been following with his podiatrist Dr. Cannon Kettle for this issue. On 09/19/2021 he had ABIs that were noncompressible with a TBI of 0.3 on the left. He subsequently had an abdominal aortogram on 4/28 that led to a femoropopliteal on 10/24/2021 by Dr. Stanford Breed. He has follow-up with vein and vascular on 11/26/2021. Patient reports chronic pain to the wound site. He has tried Iodoflex, Medihoney and antibiotic ointment to the wound bed with little benefit. Electronic Signature(s) Signed: 11/04/2021 10:55:53 AM By: Kalman Shan DO Entered By: Kalman Shan on 11/04/2021 10:48:39 -------------------------------------------------------------------------------- Physical Exam Details Patient Name:  Date of Service: Harold [name] [name] 11/04/2021 9:45 A M Medical Record Number: [id number] Patient Account Number: [id number] Date of Birth/Sex: Treating RN: 1931-08-08 (88 y.o. [name] Primary Care Provider: [name] Other Clinician: Referring Provider: Treating Provider/Extender: [name] in Treatment: 0 Constitutional respirations regular, non-labored and within target range for patient.. Cardiovascular 2+ dorsalis pedis/posterior tibialis pulses. Psychiatric pleasant and cooperative. Notes Left  foot: T the medial foot there is an open wound with nonviable tissue and eschar throughout. Strong pedal pulses. Mild swelling. No signs of surrounding o soft tissue infection. Electronic Signature(s) Signed: 11/04/2021 10:55:53 AM By: Kalman Shan DO Entered By: Kalman Shan on 11/04/2021 10:49:19 -------------------------------------------------------------------------------- Physician Orders Details Patient Name: Date of Service: [address] [name] 11/04/2021 9:45 A M Medical Record Number: [id number] Patient Account Number: [id number] Date of Birth/Sex: Treating RN: [birth year] (88 y.o. 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 - Dr. Heber [place] - Room 8 - Thursday 6/1 at 10:30 Return A Bathing/ Shower/ Hygiene May shower and wash wound with soap and water. - prior to dressing change Edema Control - Lymphedema / SCD /  Other Elevate legs to the level of the heart or above for 30 minutes daily and/or when sitting, a frequency of: - throughout the day Avoid standing for long periods of time. Moisturize legs daily. Off-Loading Other: - Avoid any direct pressure to left foot Home Health New wound care orders this week; continue Home Health for wound care. May utilize formulary equivalent dressing for wound treatment orders unless otherwise specified. - Apply Santyl to wound on left foot daily (home health to change 2-3x a week, daughter will change rest of the week) Other Home Health Orders/Instructions: - [facility] Wound Treatment Wound #1 - Foot Wound Laterality: Left, Medial Cleanser: Soap and Water 1 x Per Day/15 Days Discharge Instructions: May shower and wash wound with dial antibacterial soap and water prior to dressing change. Cleanser: Wound Cleanser 1 x Per Day/15 Days Discharge Instructions: Cleanse the wound with wound cleanser prior to applying a clean dressing using gauze sponges, not tissue or cotton balls. Prim Dressing: Santyl Ointment 1 x Per Day/15 Days ary Discharge Instructions: Apply nickel thick amount to wound bed as instructed Secondary Dressing: Woven Gauze Sponge, Non-Sterile 4x4 in 1 x Per Day/15 Days Discharge Instructions: Apply over primary dressing as directed. Secured With: Child psychotherapist, Sterile 2x75 (in/in) 1 x Per Day/15 Days Discharge Instructions: Secure with stretch gauze as directed. Secured With: 26M Medipore Public affairs consultant Surgical T 2x10 (in/yd) 1 x Per Day/15 Days ape Discharge Instructions: Secure with tape as directed. Radiology X-ray, left foot, complete view - Non healing wound on left medial foot - (ICD10 E11.621 - Type 2 diabetes mellitus with foot ulcer) Electronic Signature(s) Signed: 11/04/2021 10:55:53 AM By: Kalman Shan DO Previous Signature: 11/04/2021 10:31:35 AM Version By: Kalman Shan DO Entered By: Kalman Shan on  11/04/2021 10:49:42 Prescription 11/04/2021 -------------------------------------------------------------------------------- Serita Grit E. Kalman Shan DO Patient Name: Provider: Sep 02, 1931 [id number] Date of Birth: NPI#: Jerilynn Mages [id number] Sex: DEA #: [phone] Phone #: License #: [place] Patient Address: [address] [place] [address] , [place], [place] [address] [phone] Allergies No Known Allergies Provider's Orders X-ray, left foot, complete view - ICD10: E11.621 - Non healing wound on left medial foot Hand Signature: Date(s): Electronic Signature(s) Signed: 11/04/2021 10:55:53 AM By: Kalman Shan DO Entered By: Kalman Shan  on 11/04/2021 10:49:42 -------------------------------------------------------------------------------- Problem List Details Patient Name: Date of Service: Harold [name] [name] 11/04/2021 9:45 A M Medical Record Number: [id number] Patient Account Number: [id number] Date of Birth/Sex: Treating RN: 02-Aug-1931 (88 y.o. 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 11/04/2021 9:45 A M Medical Record Number: [id number] Patient Account Number: [id number] Date of Birth/Sex: Treating RN: 1931-11-08 (88 y.o. [name] Primary Care Provider: [name] Other Clinician: Referring Provider: Treating Provider/Extender: [name] in Treatment: 0 Subjective Chief Complaint Information obtained from Patient 11/04/2021; left foot wound History of Present Illness (HPI) Admission 11/04/2021 [name] is an 88 year old male with a past medical history of peripheral arterial disease, type 2 diabetes controlled on oral agents, former tobacco user that presents to the clinic for a medial left foot wound. He states that the wound started in March 2023. He noticed his foot was rubbing against in inside of his shoe creating a wound. He currently wears soft slippers to address this issue. He has been following with his podiatrist Dr. Cannon Kettle for this issue. On 09/19/2021 he had ABIs that were noncompressible with a TBI of 0.3 on the left. He subsequently had an abdominal aortogram on 4/28 that led to a femoropopliteal on 10/24/2021 by Dr. Stanford Breed. He has follow-up with vein and vascular on 11/26/2021. Patient reports chronic pain to the wound site. He has tried Iodoflex, Medihoney and antibiotic ointment to the wound bed with little benefit. Patient History Information obtained from Patient, Chart. Allergies No Known Allergies Family History Unknown History. Social History Former smoker, Alcohol Use - Never, Drug Use - No History, Caffeine Use - Rarely. Medical History Cardiovascular Patient has history of Coronary Artery Disease, Hypertension, Myocardial Infarction, Peripheral Arterial Disease Endocrine Patient has history of Type II Diabetes Patient is treated with Insulin, Oral Agents. Blood sugar is not tested. Medical A Surgical History  Notes nd Gastrointestinal GERD Review of Systems (ROS) Constitutional Symptoms (General Health) Denies complaints or symptoms of Fatigue, Fever, Chills, Marked Weight Change. Eyes Denies complaints or symptoms of Dry Eyes, Vision Changes, Glasses / Contacts. Ear/Nose/Mouth/Throat Denies complaints or symptoms of Chronic sinus problems or rhinitis. Respiratory Denies complaints or symptoms of Chronic or frequent coughs, Shortness of Breath. Cardiovascular Denies complaints or symptoms of Chest pain. Genitourinary Denies complaints or symptoms of Frequent urination. Musculoskeletal Denies complaints or symptoms of Muscle Pain, Muscle Weakness. Neurologic Denies complaints or symptoms of Numbness/parasthesias. Psychiatric Denies complaints or symptoms of Claustrophobia, Suicidal. Objective Constitutional respirations regular, non-labored and within target range for patient.. Vitals Time Taken: 9:47 AM, Height: 71 in, Source: Stated, Weight: 135 lbs, Source: Stated, BMI: 18.8, Temperature: 97.9 F, Pulse: 76 bpm, Respiratory Rate: 18 breaths/min, Blood Pressure: 132/75 mmHg, Capillary Blood Glucose: 130 mg/dl. General Notes: glucose per pt report Cardiovascular 2+ dorsalis pedis/posterior tibialis pulses. Psychiatric pleasant and cooperative.  General Notes: Left foot: T the medial foot there is an open wound with nonviable tissue and eschar throughout. Strong pedal pulses. Mild swelling. No signs o of surrounding soft tissue infection. Integumentary (Hair, Skin) Wound #1 status is Open. Original cause of wound was Pressure Injury. The date acquired was: 09/10/2021. The wound is located on the Left,Medial Foot. The wound measures 2.7cm length x 2.7cm width x 0.1cm depth; 5.726cm^2 area and 0.573cm^3 volume. There is no tunneling or undermining noted. There is a medium amount of drainage noted. The wound margin is flat and intact. There is no granulation within the wound bed. There  is a large (67-100%) amount of necrotic tissue within the wound bed including Eschar and Adherent Slough. 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 He had a femoropopliteal on the left side on 10/24/2021. He has strong pedal pulses on exam. I am hopeful that there is enough blood flow to heal this wound. Nevertheless with a history of diabetes, smoking and peripheral arterial disease he is at high risk for amputation. He expressed understanding. I discussed how the nonviable tissue has to be removed in order to start the healing process. He knows this could go deep to the bone. If this is the case he would require surgery. We will go ahead and order an x-ray today to assess any bony destruction. I crosshatched the eschar and recommended Santyl daily. 50 minutes was spent on the encounter including face-to-face, EMR review and coordination of care Procedures Wound #1 Pre-procedure diagnosis of Wound #1 is a Diabetic Wound/Ulcer of the Lower Extremity located on the Left,Medial Foot .Severity of Tissue Pre Debridement is: Fat layer exposed. There was a Chemical/Enzymatic/Mechanical debridement performed by LLevan Hurst RN..Marland KitchenAgent used was [employer] A time out was conducted at 10:30, prior to the start of the procedure. There was no bleeding. The procedure was tolerated well with a pain level of 0 throughout and a pain level of 0 following the procedure. Post Debridement Measurements: 2.7cm length x 2.7cm width x 0.1cm depth; 0.573cm^3 volume. Character of Wound/Ulcer Post Debridement requires further debridement. Severity of Tissue Post Debridement is: Fat layer  exposed. Post procedure Diagnosis Wound #1: Same as Pre-Procedure Plan Follow-up Appointments: Return Appointment in 1 week. - Dr. HHeber [place]- Room 8 - Thursday 6/1 at 10:30 Bathing/ Shower/ Hygiene: May shower and wash wound with soap and water. - prior to dressing change Edema Control - Lymphedema / SCD / Other: Elevate legs to the level of the heart or above for 30 minutes daily and/or when sitting, a frequency of: - throughout the day Avoid standing for long periods of time. Moisturize legs daily. Off-Loading: Other: - Avoid any direct pressure to left foot Home Health: New wound care orders this week; continue Home Health for wound care. May utilize formulary equivalent dressing for wound treatment orders unless otherwise specified. - Apply Santyl to wound on left foot daily (home health to change 2-3x a week, daughter will change rest of the week) Other Home Health Orders/Instructions: -- [id number] ordered were: X-ray, left foot, complete view - Non healing wound on left medial foot WOUND #1: - Foot Wound Laterality: Left, Medial Cleanser: Soap and Water 1  x Per EUM/35 Days Discharge Instructions: May shower and wash wound with dial antibacterial soap and water prior to dressing change. Cleanser: Wound Cleanser 1 x Per Day/15 Days Discharge Instructions: Cleanse the wound with wound cleanser prior to applying a clean dressing using gauze sponges, not tissue or cotton balls. Prim Dressing: Santyl Ointment 1 x Per Day/15 Days ary Discharge Instructions: Apply nickel thick amount to wound bed as instructed Secondary Dressing: Woven Gauze Sponge, Non-Sterile 4x4 in 1 x Per Day/15 Days Discharge Instructions: Apply over primary dressing as directed. Secured With: Child psychotherapist, Sterile 2x75 (in/in) 1 x Per Day/15 Days Discharge Instructions: Secure with stretch gauze as directed. Secured With: 41M Medipore Public affairs consultant Surgical T 2x10 (in/yd) 1 x Per Day/15  Days ape Discharge Instructions: Secure with tape as directed. 1. Santyl daily 2. Offloadingoooffloading felt pad 3. Follow-up in 1 week Electronic Signature(s) Signed: 11/04/2021 10:55:53 AM By: Kalman Shan DO Entered By: Kalman Shan on 11/04/2021 10:54:47 -------------------------------------------------------------------------------- HxROS Details Patient Name: Date of Service: [address], [place] [name] 11/04/2021 9:45 A M Medical Record Number: [id number] Patient Account Number: [id number] Date of Birth/Sex: Treating RN: January 31, 1932 (88 y.o. [name] Primary Care Provider: [name] Other Clinician: Referring Provider: Treating Provider/Extender: [name] in Treatment: 0 Information Obtained From Patient Chart Constitutional Symptoms (General Health) Complaints and Symptoms: Negative for: Fatigue; Fever; Chills; Marked Weight Change Eyes Complaints and Symptoms: Negative for: Dry Eyes; Vision Changes; Glasses / Contacts Ear/Nose/Mouth/Throat Complaints and Symptoms: Negative for: Chronic sinus problems or rhinitis Respiratory Complaints and Symptoms: Negative for: Chronic or frequent coughs; Shortness of Breath Cardiovascular Complaints and Symptoms: Negative for: Chest pain Medical History: Positive for: Coronary Artery Disease; Hypertension; Myocardial Infarction; Peripheral Arterial Disease Genitourinary Complaints and Symptoms: Negative for: Frequent urination Musculoskeletal Complaints and Symptoms: Negative for: Muscle Pain; Muscle Weakness Neurologic Complaints and Symptoms: Negative for: Numbness/parasthesias Psychiatric Complaints and Symptoms: Negative for: Claustrophobia; Suicidal Hematologic/Lymphatic Gastrointestinal Medical History: Past Medical History Notes: GERD Endocrine Medical History: Positive for: Type II Diabetes Treated with: Insulin, Oral agents Blood sugar tested every  day: No Immunological Integumentary (Skin) Oncologic Immunizations Pneumococcal Vaccine: Received Pneumococcal Vaccination: Yes Received Pneumococcal Vaccination On or After 60th Birthday: No Implantable Devices None Family and Social History Unknown History: Yes; Former smoker; Alcohol Use: Never; Drug Use: No History; Caffeine Use: Rarely; Financial Concerns: No; Food, Clothing or Shelter Needs: No; Support System Lacking: No; Transportation Concerns: No Electronic Signature(s) Signed: 11/04/2021 10:55:53 AM By: Kalman Shan DO Signed: 11/04/2021 5:26:25 PM By: [name], [name] Entered By: Levan Hurst on 11/04/2021 09:54:49 -------------------------------------------------------------------------------- [place] Details Patient Name: Date of Service: [address] [name] 11/04/2021 Medical Record Number: [id number] Patient Account Number: [id number] Date of Birth/Sex: Treating RN: 10/05/1931 (88 y.o. 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 I70.245 Atherosclerosis of native arteries of left leg with ulceration of other part of foot Facility Procedures CPT4 Code: [id number] Description: 99213 - WOUND CARE VISIT-LEV 3 EST PT Modifier: 25 Quantity: 1 CPT4 Code: [id number] Description: 39767 - DEBRIDE W/O ANES NON SELECT Modifier: Quantity: 1 Physician Procedures : CPT4 Code Description Modifier [id number] - WC PHYS LEVEL 4 - NEW PT ICD-10 Diagnosis Description E11.621 Type  2 diabetes mellitus with foot ulcer 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

## 2021-11-04 NOTE — Progress Notes (Signed)
EPIMENIO, SCHETTER ([id number]) Visit Report for 11/04/2021 Abuse Risk Screen Details Patient Name: Date of Service: Harold [name] Harold E. 11/04/2021 9:45 A M Medical Record Number: [id number] Patient Account Number: [id number] Date of Birth/Sex: Treating RN: 04-30-1932 (88 y.o. Harold [name] Primary Care Harold [name]: Harold [name] Other Clinician: Referring Harold [name]: Treating Harold [name]/Extender: Harold [name] in Treatment: 0 Abuse Risk Screen Items Answer ABUSE RISK SCREEN: Has anyone close to you tried to hurt or harm you recentlyo No Do you feel uncomfortable with anyone in your familyo No Has anyone forced you do things that you didnt want to doo No Electronic Signature(s) Signed: 11/04/2021 5:26:25 PM By: Harold Hurst RN, [name] Entered By: Harold [name] on 11/04/2021 09:56:04 -------------------------------------------------------------------------------- Activities of Daily Living Details Patient Name: Date of Service: Harold [name] [name] 11/04/2021 9:45 A M Medical Record Number: [id number] Patient Account Number: [id number] Date of Birth/Sex: Treating RN: 07-13-1931 (88 y.o. Harold [name] Primary Care Naheim Burgen: Harold [name] Other Clinician: Referring Makarios Madlock: Treating Constant [place]/Extender: Harold [name] in Treatment: 0 Activities of Daily Living Items Answer Activities of Daily Living (Please select one for each item) Drive Automobile Completely Able T Medications ake Completely Able Use T elephone Completely Able Care for Appearance Completely Able Use T oilet Completely Able Bath / Shower Completely Able Dress Self Completely Able Feed Self Completely Able Walk Completely Able Get In / Out Bed Completely Able Housework Completely Able Prepare Meals Completely Honor for Self Completely Able Electronic Signature(s) Signed: 11/04/2021 5:26:25 PM By: Harold Hurst RN, [name] Entered By: Harold [name] on 11/04/2021 09:56:30 -------------------------------------------------------------------------------- Education Screening Details Patient Name: Date of Service: [address] Harold E. 11/04/2021 9:45 A M Medical Record Number: [id number] Patient Account Number: [id number] Date of Birth/Sex: Treating RN: 09-16-1931 (88 y.o. Harold [name] Primary Care Kenneth Lax: Harold [name] Other Clinician: Referring Eithan Beagle: Treating Diani Jillson/Extender: Harold [name] in Treatment: 0 Primary Learner Assessed: Patient Learning Preferences/Education Level/Primary Language Learning Preference: Explanation, Demonstration, Printed Material Highest Education Level: High School Preferred Language: English Cognitive Barrier Language Barrier: No Translator Needed: No Memory Deficit: No Emotional Barrier: No Cultural/Religious Beliefs Affecting Medical Care: No Physical Barrier Impaired Vision: No Impaired Hearing: No Decreased Hand dexterity: No Knowledge/Comprehension Knowledge Level: High Comprehension Level: High Ability to understand written instructions: High Ability to understand verbal instructions: High Motivation Anxiety Level: Calm Cooperation: Cooperative Education Importance: Acknowledges Need Interest in Health Problems: Asks Questions Perception: Coherent Willingness to Engage in Self-Management High Activities: Readiness to Engage in Self-Management High Activities: Electronic Signature(s) Signed: 11/04/2021 5:26:25 PM By: Harold Hurst RN, [name] Entered By: Harold [name] on 11/04/2021 09:56:50 -------------------------------------------------------------------------------- Fall Risk Assessment Details Patient Name: Date of Service: [address] Harold E. 11/04/2021 9:45 A M Medical Record Number: [id number] Patient Account Number: [id number] Date of Birth/Sex: Treating RN: 23-Sep-1931 (89 y.o.  Harold [name] Primary Care Robbye Dede: Harold [name] Other Clinician: Referring Breydan [place]: Treating Shaneese Tait/Extender: Harold [name] in Treatment: 0 Fall Risk Assessment Items Have you had 2 or more falls in the last 12 monthso 0 Yes Have you had any fall that resulted in injury in the last 12 monthso 0 No FALLS RISK SCREEN History of falling - immediate or within 3 months 0 No Secondary diagnosis (Do you have 2 or more medical diagnoseso) 15 Yes Ambulatory aid None/bed rest/wheelchair/nurse 0 No Crutches/cane/walker 15 Yes Furniture 0 No Intravenous  therapy Access/Saline/Heparin Lock 0 No Gait/Transferring Normal/ bed rest/ wheelchair 0 Yes Weak (short steps with or without shuffle, stooped but able to lift head while walking, may seek 0 No support from furniture) Impaired (short steps with shuffle, may have difficulty arising from chair, head down, impaired 0 No balance) Mental Status Oriented to own ability 0 Yes Electronic Signature(s) Signed: 11/04/2021 5:26:25 PM By: Harold Hurst RN, [name] Entered By: Harold [name] on 11/04/2021 09:59:13 -------------------------------------------------------------------------------- Foot Assessment Details Patient Name: Date of Service: [address] [name] 11/04/2021 9:45 A M Medical Record Number: [id number] Patient Account Number: [id number] Date of Birth/Sex: Treating RN: 09/02/1931 (88 y.o. Harold [name] Primary Care Deno Sida: Harold [name] Other Clinician: Referring Hazelyn Kallen: Treating Kynzlee Hucker/Extender: Harold [name] in Treatment: 0 Foot Assessment Items Site Locations + = Sensation present, - = Sensation absent, C = Callus, U = Ulcer R = Redness, W = Warmth, M = Maceration, PU = Pre-ulcerative lesion F = Fissure, S = Swelling, D = Dryness Assessment Right: Left: Other Deformity: No No Prior Foot Ulcer: No No Prior Amputation: No No Charcot Joint:  No No Ambulatory Status: Ambulatory With Help Assistance Device: Cane Gait: Steady Electronic Signature(s) Signed: 11/04/2021 5:26:25 PM By: Harold Hurst RN, [name] Entered By: Harold [name] on 11/04/2021 10:06:21 -------------------------------------------------------------------------------- Nutrition Risk Screening Details Patient Name: Date of Service: [address] Harold E. 11/04/2021 9:45 A M Medical Record Number: [id number] Patient Account Number: [id number] Date of Birth/Sex: Treating RN: [birth year] (88 y.o. Harold [name] Primary Care Jarett Dralle: Harold [name] Other Clinician: Referring Srinivas Lippman: Treating Jef Futch/Extender: Venetia Constable, Titorya [name] in Treatment: 0 Height (in): 71 Weight (lbs): 135 Body Mass Index (BMI): 18.8 Nutrition Risk Screening Items Score Screening NUTRITION RISK SCREEN: I have an illness or condition that made me change the kind and/or amount of food I eat 0 No I eat fewer than two meals per day 0 No I eat few fruits and vegetables, or milk products 0 No I have three or more drinks of beer, liquor or wine almost every day 0 No I have tooth or mouth problems that make it hard for me to eat 0 No I don't always have enough money to buy the food I need 0 No I eat alone most of the time 0 No I take three or more different prescribed or over-the-counter drugs a day 1 Yes Without wanting to, I have lost or gained 10 pounds in the last six months 0 No I am not always physically able to shop, cook and/or feed myself 2 Yes Nutrition Protocols Good Risk Protocol Moderate Risk Protocol 0 Provide education on nutrition High Risk Proctocol Risk Level: Moderate Risk Score: 3 Electronic Signature(s) Signed: 11/04/2021 5:26:25 PM By: Harold Hurst RN, [name] Entered By: Harold [name] on 11/04/2021 10:00:55

## 2021-11-06 DIAGNOSIS — E119 Type 2 diabetes mellitus without complications: Secondary | ICD-10-CM | POA: Diagnosis not present

## 2021-11-06 DIAGNOSIS — I10 Essential (primary) hypertension: Secondary | ICD-10-CM | POA: Diagnosis not present

## 2021-11-06 DIAGNOSIS — Z7984 Long term (current) use of oral hypoglycemic drugs: Secondary | ICD-10-CM | POA: Diagnosis not present

## 2021-11-06 DIAGNOSIS — K219 Gastro-esophageal reflux disease without esophagitis: Secondary | ICD-10-CM | POA: Diagnosis not present

## 2021-11-06 DIAGNOSIS — Z48812 Encounter for surgical aftercare following surgery on the circulatory system: Secondary | ICD-10-CM | POA: Diagnosis not present

## 2021-11-06 DIAGNOSIS — I739 Peripheral vascular disease, unspecified: Secondary | ICD-10-CM | POA: Diagnosis not present

## 2021-11-06 DIAGNOSIS — I2510 Atherosclerotic heart disease of native coronary artery without angina pectoris: Secondary | ICD-10-CM | POA: Diagnosis not present

## 2021-11-06 DIAGNOSIS — L89892 Pressure ulcer of other site, stage 2: Secondary | ICD-10-CM | POA: Diagnosis not present

## 2021-11-06 DIAGNOSIS — E785 Hyperlipidemia, unspecified: Secondary | ICD-10-CM | POA: Diagnosis not present

## 2021-11-06 DIAGNOSIS — M1380 Other specified arthritis, unspecified site: Secondary | ICD-10-CM | POA: Diagnosis not present

## 2021-11-07 ENCOUNTER — Ambulatory Visit: Payer: PRIVATE HEALTH INSURANCE | Admitting: Sports Medicine

## 2021-11-08 DIAGNOSIS — Z7984 Long term (current) use of oral hypoglycemic drugs: Secondary | ICD-10-CM | POA: Diagnosis not present

## 2021-11-08 DIAGNOSIS — K219 Gastro-esophageal reflux disease without esophagitis: Secondary | ICD-10-CM | POA: Diagnosis not present

## 2021-11-08 DIAGNOSIS — L89892 Pressure ulcer of other site, stage 2: Secondary | ICD-10-CM | POA: Diagnosis not present

## 2021-11-08 DIAGNOSIS — E119 Type 2 diabetes mellitus without complications: Secondary | ICD-10-CM | POA: Diagnosis not present

## 2021-11-08 DIAGNOSIS — Z48812 Encounter for surgical aftercare following surgery on the circulatory system: Secondary | ICD-10-CM | POA: Diagnosis not present

## 2021-11-08 DIAGNOSIS — M1380 Other specified arthritis, unspecified site: Secondary | ICD-10-CM | POA: Diagnosis not present

## 2021-11-08 DIAGNOSIS — E785 Hyperlipidemia, unspecified: Secondary | ICD-10-CM | POA: Diagnosis not present

## 2021-11-08 DIAGNOSIS — I10 Essential (primary) hypertension: Secondary | ICD-10-CM | POA: Diagnosis not present

## 2021-11-08 DIAGNOSIS — I2510 Atherosclerotic heart disease of native coronary artery without angina pectoris: Secondary | ICD-10-CM | POA: Diagnosis not present

## 2021-11-08 DIAGNOSIS — I739 Peripheral vascular disease, unspecified: Secondary | ICD-10-CM | POA: Diagnosis not present

## 2021-11-13 DIAGNOSIS — K219 Gastro-esophageal reflux disease without esophagitis: Secondary | ICD-10-CM | POA: Diagnosis not present

## 2021-11-13 DIAGNOSIS — I739 Peripheral vascular disease, unspecified: Secondary | ICD-10-CM | POA: Diagnosis not present

## 2021-11-13 DIAGNOSIS — Z48812 Encounter for surgical aftercare following surgery on the circulatory system: Secondary | ICD-10-CM | POA: Diagnosis not present

## 2021-11-13 DIAGNOSIS — I10 Essential (primary) hypertension: Secondary | ICD-10-CM | POA: Diagnosis not present

## 2021-11-13 DIAGNOSIS — L89892 Pressure ulcer of other site, stage 2: Secondary | ICD-10-CM | POA: Diagnosis not present

## 2021-11-13 DIAGNOSIS — E785 Hyperlipidemia, unspecified: Secondary | ICD-10-CM | POA: Diagnosis not present

## 2021-11-13 DIAGNOSIS — Z7984 Long term (current) use of oral hypoglycemic drugs: Secondary | ICD-10-CM | POA: Diagnosis not present

## 2021-11-13 DIAGNOSIS — I2510 Atherosclerotic heart disease of native coronary artery without angina pectoris: Secondary | ICD-10-CM | POA: Diagnosis not present

## 2021-11-13 DIAGNOSIS — E119 Type 2 diabetes mellitus without complications: Secondary | ICD-10-CM | POA: Diagnosis not present

## 2021-11-13 DIAGNOSIS — M1380 Other specified arthritis, unspecified site: Secondary | ICD-10-CM | POA: Diagnosis not present

## 2021-11-14 ENCOUNTER — Encounter (HOSPITAL_BASED_OUTPATIENT_CLINIC_OR_DEPARTMENT_OTHER): Payer: Medicare Other | Attending: Internal Medicine | Admitting: Internal Medicine

## 2021-11-14 DIAGNOSIS — Z7984 Long term (current) use of oral hypoglycemic drugs: Secondary | ICD-10-CM | POA: Insufficient documentation

## 2021-11-14 DIAGNOSIS — I70245 Atherosclerosis of native arteries of left leg with ulceration of other part of foot: Secondary | ICD-10-CM | POA: Insufficient documentation

## 2021-11-14 DIAGNOSIS — E1151 Type 2 diabetes mellitus with diabetic peripheral angiopathy without gangrene: Secondary | ICD-10-CM | POA: Diagnosis not present

## 2021-11-14 DIAGNOSIS — E11621 Type 2 diabetes mellitus with foot ulcer: Secondary | ICD-10-CM | POA: Diagnosis not present

## 2021-11-14 DIAGNOSIS — M86672 Other chronic osteomyelitis, left ankle and foot: Secondary | ICD-10-CM | POA: Insufficient documentation

## 2021-11-14 DIAGNOSIS — L97508 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 with other specified severity: Secondary | ICD-10-CM | POA: Diagnosis not present

## 2021-11-14 DIAGNOSIS — R269 Unspecified abnormalities of gait and mobility: Secondary | ICD-10-CM | POA: Diagnosis not present

## 2021-11-14 DIAGNOSIS — E1169 Type 2 diabetes mellitus with other specified complication: Secondary | ICD-10-CM | POA: Insufficient documentation

## 2021-11-14 DIAGNOSIS — Z87891 Personal history of nicotine dependence: Secondary | ICD-10-CM | POA: Diagnosis not present

## 2021-11-14 NOTE — Progress Notes (Signed)
JAHIR, HALT ([id number]) Visit Report for 11/14/2021 Chief Complaint Document Details Patient Name: Date of Service: Harold [name] [name] 11/14/2021 10:30 A M Medical Record Number: [id number] Patient Account Number: [id number] Date of Birth/Sex: Treating RN: Jul 05, 1931 (88 y.o. Harold [name] Primary Care Provider: [name] Other Clinician: Referring Provider: Treating Provider/Extender: Darl Pikes Weeks in Treatment: 1 Information Obtained from: Patient Chief Complaint 11/04/2021; left foot wound Electronic Signature(s) Signed: 11/14/2021 1:29:37 PM By: Kalman Shan DO Entered By: Kalman Shan on 11/14/2021 12:05:38 -------------------------------------------------------------------------------- Debridement Details Patient Name: Date of Service: [address] [name] 11/14/2021 10:30 A M Medical Record Number: [id number] Patient Account Number: [id number] Date of Birth/Sex: Treating RN: [birth year] (88 y.o. Harold [name], Harold [name] Primary Care Provider: [name] Other Clinician: Referring Provider: Treating Provider/Extender: Darl Pikes Weeks in Treatment: 1 Debridement Performed for Assessment: Wound #1 Left,Medial Foot Performed By: Physician Kalman Shan, DO Debridement Type: Debridement Severity of Tissue Pre Debridement: Fat layer exposed Level of Consciousness (Pre-procedure): Awake and Alert Pre-procedure Verification/Time Out Yes - 10:50 Taken: Start Time: 10:51 Pain Control: Lidocaine 5% topical ointment T Area Debrided (L x W): otal 2.7 (cm) x 2.8 (cm) = 7.56 (cm) Tissue and other material debrided: Non-Viable, Slough, Slough Level: Non-Viable Tissue Debridement Description: Selective/Open Wound Instrument: Blade, Forceps Bleeding: Minimum Hemostasis Achieved: Pressure End Time: 10:56 Procedural Pain: 0 Post Procedural Pain: 0 Response to Treatment: Procedure was  tolerated well Level of Consciousness (Post- Awake and Alert procedure): Post Debridement Measurements of Total Wound Length: (cm) 2.7 Width: (cm) 2.8 Depth: (cm) 0.1 Volume: (cm) 0.594 Character of Wound/Ulcer Post Debridement: Requires Further Debridement Severity of Tissue Post Debridement: Fat layer exposed Post Procedure Diagnosis Same as Pre-procedure Electronic Signature(s) Signed: 11/14/2021 1:29:37 PM By: Kalman Shan DO Signed: 11/14/2021 4:48:29 PM By: Deon Pilling RN, [name] Entered By: Deon Pilling on 11/14/2021 10:56:46 -------------------------------------------------------------------------------- HPI Details Patient Name: Date of Service: [address] Harold [name] [name] 11/14/2021 10:30 A M Medical Record Number: [id number] Patient Account Number: [id number] Date of Birth/Sex: Treating RN: 05-18-1932 (88 y.o. Harold [name] Primary Care Provider: [name] Other Clinician: Referring Provider: Treating Provider/Extender: Darl Pikes Weeks in Treatment: 1 History of Present Illness HPI Description: Admission 11/04/2021 [name] is an 88 year old male with a past medical history of peripheral arterial disease, type 2 diabetes controlled on oral agents, former tobacco user that presents to the clinic for a medial left foot wound. He states that the wound started in March 2023. He noticed his foot was rubbing against in inside of his shoe creating a wound. He currently wears soft slippers to address this issue. He has been following with his podiatrist Dr. Cannon Kettle for this issue. On 09/19/2021 he had ABIs that were noncompressible with a TBI of 0.3 on the left. He subsequently had an abdominal aortogram on 4/28 that led to a femoropopliteal on 10/24/2021 by Dr. Stanford Breed. He has follow-up with vein and vascular on 11/26/2021. Patient reports chronic pain to the wound site. He has tried Iodoflex, Medihoney and antibiotic ointment to the  wound bed with little benefit. 6/1; patient presents for follow-up. He obtained his x-ray that showed new cortical erosion within the medial aspect of the great toe metatarsal head concerning for acute osteomyelitis. He has been using Santyl to the wound bed. He continues to report chronic pain to the wound site. He currently denies signs of infection. Electronic Signature(s)  Signed: 11/14/2021 1:29:37 PM By: Kalman Shan DO Entered By: Kalman Shan on 11/14/2021 12:06:16 -------------------------------------------------------------------------------- Physical Exam Details Patient Name: Date of Service: [place], [place] 11/14/2021 10:30 A M Medical Record Number: [id number] Patient Account Number: [id number] Date of Birth/Sex: Treating RN: [birth year] (88 y.o. Harold [name] Primary Care Provider: [name] Other Clinician: Referring Provider: Treating Provider/Extender: Darl Pikes Weeks in Treatment: 1 Constitutional respirations regular, non-labored and within target range for patient.. Cardiovascular 2+ dorsalis pedis/posterior tibialis pulses. Psychiatric pleasant and cooperative. Notes Left foot: T the medial foot there is an open wound with nonviable tissue and granulation tissue to the edges circumferentially. Mild swelling. No signs of o surrounding soft tissue infection. Electronic Signature(s) Signed: 11/14/2021 1:46:20 PM By: Kalman Shan DO Previous Signature: 11/14/2021 1:29:37 PM Version By: Kalman Shan DO Entered By: Kalman Shan on 11/14/2021 13:41:09 -------------------------------------------------------------------------------- Physician Orders Details Patient Name: Date of Service: [address] [name] 11/14/2021 10:30 A M Medical Record Number: [id number] Patient Account Number: [id number] Date of Birth/Sex: Treating RN: Jan 04, 1932 (88 y.o. 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 - Dr. Heber [place] (Dr. Dellia Nims covering) and [place], Room 8 - Wednesday 300pm 11/20/2021 Return A Dr. Heber [place] and [place], Room 8 Tuesday 1115am 11/26/2021 Other: - Infectious Disease should call you to schedule an appt. Bathing/ Shower/ Hygiene May shower and wash wound with soap and water. - prior to dressing change Edema Control - Lymphedema / SCD / Other Elevate legs to the level of the heart or above for 30 minutes daily and/or when sitting, a frequency of: - throughout the day Avoid standing for long periods of time. Moisturize legs daily. Off-Loading Other: - Avoid any direct pressure to left foot Home Health No change in wound care orders this week; continue Home Health for wound care. May utilize formulary equivalent dressing for wound treatment orders unless otherwise specified. - Apply Santyl to wound on left foot daily (home health to change 2-3x a week, daughter will change rest of the week) Other Home Health Orders/Instructions: - [facility] Wound Treatment Wound #1 - Foot Wound Laterality: Left, Medial Cleanser: Soap and Water 1 x Per Day/15 Days Discharge Instructions: May shower and wash wound with dial antibacterial soap and water prior to dressing change. Cleanser: Wound Cleanser 1 x Per Day/15 Days Discharge Instructions: Cleanse the wound with wound cleanser prior to applying a clean dressing using gauze sponges, not tissue or cotton balls. Prim Dressing: Santyl Ointment 1 x Per Day/15  Days ary Discharge Instructions: Apply nickel thick amount to wound bed as instructed Secondary Dressing: Woven Gauze Sponge, Non-Sterile 4x4 in 1 x Per Day/15 Days Discharge Instructions: Apply over primary dressing as directed. Secured With: Child psychotherapist, Sterile 2x75 (in/in) 1 x Per Day/15 Days Discharge Instructions: Secure with stretch gauze as directed. Secured With: 42M Medipore Public affairs consultant Surgical T 2x10 (in/yd) 1 x Per Day/15 Days ape Discharge Instructions: Secure with tape as directed. Consults Infectious Disease - ****STAT**** ID referral related to x-ray resulting for concerning for erosion from acute  osteomyelitis. - (ICD10 E11.621 - Type 2 diabetes mellitus with foot ulcer) Electronic Signature(s) Signed: 11/14/2021 1:46:20 PM By: Kalman Shan DO Previous Signature: 11/14/2021 1:29:37 PM Version By: Kalman Shan DO Entered By: Kalman Shan on 11/14/2021 13:41:37 Prescription 11/14/2021 -------------------------------------------------------------------------------- Serita Grit E. Kalman Shan DO Patient Name: Provider: 1931/11/19 [id number] Date of Birth: NPI#: Jerilynn Mages [id number] Sex: DEA #: [phone] Phone #: License #: [place] Patient Address: [address] [place] [address] , [place], [place] [address] [phone] Allergies No Known Allergies Provider's Orders Infectious Disease - ICD10: E11.621 - ****STAT**** ID referral related to x-ray resulting for concerning for erosion from acute osteomyelitis. Hand Signature: Date(s): Electronic Signature(s) Signed: 11/14/2021 1:46:20 PM By: Kalman Shan DO Previous Signature: 11/14/2021 1:29:37 PM Version By: Kalman Shan DO Entered By: Kalman Shan on 11/14/2021 13:41:37 -------------------------------------------------------------------------------- Problem List  Details Patient Name: Date of Service: [address], [place] [name] 11/14/2021 10:30 A M Medical Record Number: [id number] Patient Account Number: [id number] Date of Birth/Sex: Treating RN: 1931-12-30 (88 y.o. 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 with other 11/04/2021 No Yes specified severity I70.245 Atherosclerosis of native arteries of left leg with ulceration of other part of 11/04/2021 No Yes foot Inactive Problems Resolved Problems Electronic Signature(s) Signed: 11/14/2021 1:29:37 PM By: Kalman Shan DO Entered By: Kalman Shan on 11/14/2021 12:05:23 -------------------------------------------------------------------------------- Progress Note Details Patient Name: Date of Service: [place], [place] 11/14/2021 10:30 A M Medical Record Number: [id number] Patient Account Number: [id number] Date of Birth/Sex: Treating RN: [birth year] (88 y.o. Harold [name] Primary Care Provider: [name] Other Clinician: Referring Provider: Treating Provider/Extender: Darl Pikes Weeks in Treatment: 1 Subjective Chief Complaint Information obtained from Patient 11/04/2021; left foot wound History of Present Illness (HPI) Admission 11/04/2021 Mr. Ege Muckey is an 88 year old male with a past medical history of peripheral arterial disease, type 2 diabetes controlled on oral agents, former tobacco user that presents to the clinic for a medial left foot wound. He states that the wound started in March 2023. He noticed his foot was rubbing against in inside of his shoe creating a wound. He  currently wears soft slippers to address this issue. He has been following with his podiatrist Dr. Cannon Kettle for this issue. On 09/19/2021 he had ABIs that were noncompressible with a TBI of 0.3 on the left. He subsequently had an abdominal aortogram on 4/28 that led to a femoropopliteal on 10/24/2021 by Dr. Stanford Breed. He has follow-up with vein and vascular on 11/26/2021. Patient reports chronic pain to the wound site. He has tried Iodoflex, Medihoney and antibiotic ointment to the wound bed with little benefit. 6/1; patient presents for follow-up. He obtained his x-ray that showed new cortical erosion within the medial aspect of the great toe metatarsal head concerning for acute osteomyelitis. He has been using Santyl to the wound bed. He continues to report chronic pain to the wound site. He currently denies signs of infection. Patient History Information obtained from Patient, Chart. Family History Unknown History. Social History Former smoker, Alcohol Use - Never, Drug Use - No History, Caffeine Use - Rarely. Medical History  Cardiovascular Patient has history of Coronary Artery Disease, Hypertension, Myocardial Infarction, Peripheral Arterial Disease Endocrine Patient has history of Type II Diabetes Medical A Surgical History Notes nd Gastrointestinal GERD Objective Constitutional respirations regular, non-labored and within target range for patient.. Vitals Time Taken: 10:33 AM, Height: 71 in, Weight: 135 lbs, BMI: 18.8, Temperature: 97.9 F, Pulse: 52 bpm, Respiratory Rate: 18 breaths/min, Blood Pressure: 127/49 mmHg, Capillary Blood Glucose: 127 mg/dl. Cardiovascular 2+ dorsalis pedis/posterior tibialis pulses. Psychiatric pleasant and cooperative. General Notes: Left foot: T the medial foot there is an open wound with nonviable tissue and granulation tissue to the edges circumferentially. Mild swelling. o No signs of surrounding soft tissue infection. Integumentary (Hair,  Skin) Wound #1 status is Open. Original cause of wound was Pressure Injury. The date acquired was: 09/10/2021. The wound has been in treatment 1 weeks. The wound is located on the Left,Medial Foot. The wound measures 2.7cm length x 2.8cm width x 0.1cm depth; 5.938cm^2 area and 0.594cm^3 volume. There is Fat Layer (Subcutaneous Tissue) exposed. There is no tunneling or undermining noted. There is a medium amount of purulent drainage noted. The wound margin is distinct with the outline attached to the wound base. There is small (1-33%) red granulation within the wound bed. There is a large (67-100%) amount of necrotic tissue within the wound bed including Adherent Slough. 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 The eschar is no longer present. There is still nonviable tissue present and I debrided some of this today. Patient had an x-ray that showed concern for acute osteomyelitis. I referred him to infectious disease. No surrounding signs of soft tissue infection currently. He knows to go to the ED if he were to experience any fever/chills, nausea/vomiting increased erythema and warmth to the area. Follow-up in 1 week. Procedures Wound #1 Pre-procedure diagnosis of Wound #1 is a Diabetic Wound/Ulcer of the Lower Extremity located on the Left,Medial Foot .Severity of Tissue Pre Debridement is: Fat layer exposed. There was a Selective/Open Wound Non-Viable Tissue Debridement with a total area of 7.56 sq cm performed by Kalman Shan, DO. With the following instrument(s): Blade, and Forceps to remove Non-Viable tissue/material. Material removed includes Harold [name] after achieving pain control using Lidocaine 5% topical ointment. A time out was conducted at 10:50, prior  to the start of the procedure. A Minimum amount of bleeding was controlled with Pressure. The procedure was tolerated well with a pain level of 0 throughout and a pain level of 0 following the procedure. Post Debridement Measurements: 2.7cm length x 2.8cm width x 0.1cm depth; 0.594cm^3 volume. Character of Wound/Ulcer Post Debridement requires further debridement. Severity of Tissue Post Debridement is: Fat layer exposed. Post procedure Diagnosis Wound #1: Same as Pre-Procedure Plan Follow-up Appointments: Return Appointment in 1 week. - Dr. Heber [place] (Dr. Dellia Nims covering) and [place], Room 8 - Wednesday 300pm 11/20/2021 Dr. Heber [place] and [place], Room 8 Tuesday 1115am 11/26/2021 Other: - Infectious Disease should call you to schedule an appt. Bathing/ Shower/ Hygiene: May shower and wash wound with soap and water. - prior to dressing change Edema Control - Lymphedema / SCD / Other: Elevate legs to the level of the heart or above for 30 minutes daily and/or when sitting, a frequency of: - throughout the day Avoid standing for long periods of time. Moisturize legs  daily. Off-Loading: Other: - Avoid any direct pressure to left foot Home Health: No change in wound care orders this week; continue Home Health for wound care. May utilize formulary equivalent dressing for wound treatment orders unless otherwise specified. - Apply Santyl to wound on left foot daily (home health to change 2-3x a week, daughter will change rest of the week) Other Home Health Orders/Instructions: - [facility] Consults ordered were: Infectious Disease - ****STAT**** ID referral related to x-ray resulting for concerning for erosion from acute osteomyelitis. WOUND #1: - Foot Wound Laterality: Left, Medial Cleanser: Soap and Water 1 x Per [facility]/94 Days Discharge Instructions: May shower and wash wound with dial antibacterial soap and water prior to dressing change. Cleanser: Wound Cleanser 1 x Per Day/15 Days Discharge Instructions:  Cleanse the wound with wound cleanser prior to applying a clean dressing using gauze sponges, not tissue or cotton balls. Prim Dressing: Santyl Ointment 1 x Per Day/15 Days ary Discharge Instructions: Apply nickel thick amount to wound bed as instructed Secondary Dressing: Woven Gauze Sponge, Non-Sterile 4x4 in 1 x Per Day/15 Days Discharge Instructions: Apply over primary dressing as directed. Secured With: Child psychotherapist, Sterile 2x75 (in/in) 1 x Per Day/15 Days Discharge Instructions: Secure with stretch gauze as directed. Secured With: 59M Medipore Public affairs consultant Surgical T 2x10 (in/yd) 1 x Per Day/15 Days ape Discharge Instructions: Secure with tape as directed. 1. In office sharp debridement 2. Continue Santyl 3. Infectious disease referral 4. Follow-up in 1 week Electronic Signature(s) Signed: 11/14/2021 1:46:20 PM By: Kalman Shan DO Entered By: Kalman Shan on 11/14/2021 13:44:55 -------------------------------------------------------------------------------- HxROS Details Patient Name: Date of Service: [address] [place] [name] 11/14/2021 10:30 A M Medical Record Number: [id number] Patient Account Number: [id number] Date of Birth/Sex: Treating RN: 1932/03/10 (88 y.o. Harold [name] Primary Care Provider: [name] Other Clinician: Referring Provider: Treating Provider/Extender: Darl Pikes Weeks in Treatment: 1 Information Obtained From Patient Chart Cardiovascular Medical History: Positive for: Coronary Artery Disease; Hypertension; Myocardial Infarction; Peripheral Arterial Disease Gastrointestinal Medical History: Past Medical History Notes: GERD Endocrine Medical History: Positive for: Type II Diabetes Treated with: Insulin, Oral agents Blood sugar tested every day: No Immunizations Pneumococcal Vaccine: Received Pneumococcal Vaccination: Yes Received Pneumococcal Vaccination On or After 60th  Birthday: No Implantable Devices None Family and Social History Unknown History: Yes; Former smoker; Alcohol Use: Never; Drug Use: No History; Caffeine Use: Rarely; Financial Concerns: No; Food, Clothing or Shelter Needs: No; Support System Lacking: No; Transportation Concerns: No Electronic Signature(s) Signed: 11/14/2021 1:29:37 PM By: Kalman Shan DO Signed: 11/14/2021 4:48:29 PM By: Deon Pilling RN, [name] Entered By: Kalman Shan on 11/14/2021 12:06:21 -------------------------------------------------------------------------------- SuperBill Details Patient Name: Date of Service: [address] [name] 11/14/2021 Medical Record Number: [id number] Patient Account Number: [id number] Date of Birth/Sex: Treating RN: 1931/10/04 (89 y.o. 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

## 2021-11-14 NOTE — Progress Notes (Signed)
RIGGINS, CISEK ([id number]) Visit Report for 11/14/2021 Arrival Information Details Patient Name: Date of Service: Harold [name] Harold E. 11/14/2021 10:30 A M Medical Record Number: [id number] Patient Account Number: [id number] Date of Birth/Sex: Treating RN: May 01, 1932 (88 y.o. Harold [name] Primary Care Harold [name]: Harold [name] Other Clinician: Referring Nguyen Todorov: Treating Melany Wiesman/Extender: Darl Pikes Weeks in Treatment: 1 Visit Information History Since Last Visit Added or deleted any medications: No Patient Arrived: Harold [name] Any new allergies or adverse reactions: No Arrival Time: 10:31 Had a fall or experienced change in No Accompanied By: daughter activities of daily living that may affect Transfer Assistance: None risk of falls: Patient Identification Verified: Yes Signs or symptoms of abuse/neglect since last visito No Secondary Verification Process Completed: Yes Hospitalized since last visit: No Patient Requires Transmission-Based Precautions: No Implantable device outside of the clinic excluding No Patient Has Alerts: Yes cellular tissue based products placed in the center Patient Alerts: R ABI Harold [name]: 0.53 since last visit: L ABI Harold [name] L [name]: 0.30 Has Dressing in Place as Prescribed: Yes Pain Present Now: Yes Electronic Signature(s) Signed: 11/14/2021 3:50:29 PM By: Harold [name] Entered By: Harold [name] on 11/14/2021 10:31:48 -------------------------------------------------------------------------------- Encounter Discharge Information Details Patient Name: Date of Service: [address] Harold E. 11/14/2021 10:30 A M Medical Record Number: [id number] Patient Account Number: [id number] Date of Birth/Sex: Treating RN: 1931/10/02 (88 y.o. Harold [name] Primary Care Barnes Florek: Harold [name] Other Clinician: Referring Abbeygail Igoe: Treating Harold [name]/Extender: Darl Pikes Weeks in Treatment:  1 Encounter Discharge Information Items Post Procedure Vitals Discharge Condition: Stable Temperature (F): 97.9 Ambulatory Status: Ambulatory Pulse (bpm): 52 Discharge Destination: Home Respiratory Rate (breaths/min): 20 Transportation: Private Auto Blood Pressure (mmHg): 127/49 Accompanied By: daughter Schedule Follow-up Appointment: Yes Clinical Summary of Care: Electronic Signature(s) Signed: 11/14/2021 4:48:29 PM By: Harold Pilling RN, [name] Entered By: Harold [name] on 11/14/2021 11:00:03 -------------------------------------------------------------------------------- Lower Extremity Assessment Details Patient Name: Date of Service: Harold [name] 11/14/2021 10:30 A M Medical Record Number: [id number] Patient Account Number: [id number] Date of Birth/Sex: Treating RN: 24-Mar-1932 (88 y.o. Harold [name] Primary Care Emersynn Deatley: Harold [name] Other Clinician: Referring Harold [name]: Treating Harold [name]/Extender: Darl Pikes Weeks in Treatment: 1 Edema Assessment Assessed: [Left: Yes] [Right: No] Edema: [Left: Ye] [Right: s] Calf Left: Right: Point of Measurement: From Medial Instep 35 cm Ankle Left: Right: Point of Measurement: From Medial Instep 23 cm Vascular Assessment Pulses: Dorsalis Pedis Palpable: [Left:Yes] Electronic Signature(s) Signed: 11/14/2021 3:50:29 PM By: Harold [name] Entered By: Harold [name] on 11/14/2021 10:39:11 -------------------------------------------------------------------------------- Multi Wound Chart Details Patient Name: Date of Service: Harold Anna, [name] 11/14/2021 10:30 A M Medical Record Number: [id number] Patient Account Number: [id number] Date of Birth/Sex: Treating RN: 1931-11-18 (88 y.o. Harold [name], [name] Primary Care Lemonte Al: Harold [name] Other Clinician: Referring Emika Tiano: Treating Harold [name]/Extender: Darl Pikes Weeks in Treatment: 1 Vital  Signs Height(in): 71 Capillary Blood Glucose(mg/dl): 127 Weight(lbs): 135 Pulse(bpm): 2 Body Mass Index(BMI): 18.8 Blood Pressure(mmHg): 127/49 Temperature(F): 97.9 Respiratory Rate(breaths/min): 18 Photos: [1:Left, Medial Foot] [N/A:N/A N/A] Wound Location: [1:Pressure Injury] [N/A:N/A] Wounding Event: [1:Diabetic Wound/Ulcer of the Lower] [N/A:N/A] Primary Etiology: [1:Extremity Coronary Artery Disease,] [N/A:N/A] Comorbid History: [1:Hypertension, Myocardial Infarction, Peripheral Arterial Disease, Type II Diabetes 09/10/2021] [N/A:N/A] Date Acquired: [1:1] [N/A:N/A] Weeks of Treatment: [1:Open] [N/A:N/A] Wound Status: [1:No] [N/A:N/A] Wound Recurrence: [1:2.7x2.8x0.1] [N/A:N/A] Measurements L x W x D (cm) [1:5.938] [N/A:N/A] A (cm) :  rea [1:0.594] [N/A:N/A] Volume (cm) : [1:-3.70%] [N/A:N/A] % Reduction in A [1:rea: -3.70%] [N/A:N/A] % Reduction in Volume: [1:Unable to visualize wound bed] [N/A:N/A] Classification: [1:Medium] [N/A:N/A] Exudate A mount: [1:Purulent] [N/A:N/A] Exudate Type: [1:yellow, brown, green] [N/A:N/A] Exudate Color: [1:Distinct, outline attached] [N/A:N/A] Wound Margin: [1:Small (1-33%)] [N/A:N/A] Granulation A mount: [1:Red] [N/A:N/A] Granulation Quality: [1:Large (67-100%)] [N/A:N/A] Necrotic A mount: [1:Fat Layer (Subcutaneous Tissue): Yes N/A] Exposed Structures: [1:Fascia: No Tendon: No Muscle: No Joint: No Bone: No None] [N/A:N/A] Epithelialization: [1:Debridement - Selective/Open Wound N/A] Debridement: Pre-procedure Verification/Time Out 10:50 [N/A:N/A] Taken: [1:Lidocaine 5% topical ointment] [N/A:N/A] Pain Control: [1:Slough] [N/A:N/A] Tissue Debrided: [1:Non-Viable Tissue] [N/A:N/A] Level: [1:7.56] [N/A:N/A] Debridement A (sq cm): [1:rea Blade, Forceps] [N/A:N/A] Instrument: [1:Minimum] [N/A:N/A] Bleeding: [1:Pressure] [N/A:N/A] Hemostasis A chieved: [1:0] [N/A:N/A] Procedural Pain: [1:0] [N/A:N/A] Post Procedural Pain:  [1:Procedure was tolerated well] [N/A:N/A] Debridement Treatment Response: [1:2.7x2.8x0.1] [N/A:N/A] Post Debridement Measurements L x W x D (cm) [1:0.594] [N/A:N/A] Post Debridement Volume: (cm) [1:Debridement] [N/A:N/A] Treatment Notes Wound #1 (Foot) Wound Laterality: Left, Medial Cleanser Soap and Water Discharge Instruction: May shower and wash wound with dial antibacterial soap and water prior to dressing change. Wound Cleanser Discharge Instruction: Cleanse the wound with wound cleanser prior to applying a clean dressing using gauze sponges, not tissue or cotton balls. Peri-Wound Care Topical Primary Dressing Santyl Ointment Discharge Instruction: Apply nickel thick amount to wound bed as instructed Secondary Dressing Woven Gauze Sponge, Non-Sterile 4x4 in Discharge Instruction: Apply over primary dressing as directed. Secured With Conforming Stretch Gauze Bandage, Sterile 2x75 (in/in) Discharge Instruction: Secure with stretch gauze as directed. 87M Medipore Soft Cloth Surgical T 2x10 (in/Harold) ape Discharge Instruction: Secure with tape as directed. Compression Wrap Compression Stockings Add-Ons Electronic Signature(s) Signed: 11/14/2021 1:29:37 PM By: Kalman Shan DO Signed: 11/14/2021 4:48:29 PM By: Harold Pilling RN, [name] Entered By: Kalman Shan on 11/14/2021 12:05:29 -------------------------------------------------------------------------------- Multi-Disciplinary Care Plan Details Patient Name: Date of Service: [name], [place] Harold E. 11/14/2021 10:30 A M Medical Record Number: [id number] Patient Account Number: [id number] Date of Birth/Sex: Treating RN: Feb 26, 1932 (88 y.o. Harold [name], [name] Primary Care Alexx Mcburney: Harold [name] Other Clinician: Referring Kahlen Morais: Treating Payson Crumby/Extender: Adline Mango in Treatment: 1 Multidisciplinary Care Plan reviewed with physician Active Inactive Abuse / Safety / Falls / Self  Care Management Nursing Diagnoses: Potential for falls Potential for injury related to falls Goals: Patient will not experience any injury related to falls Date Initiated: 11/04/2021 Target Resolution Date: 12/06/2021 Goal Status: Active Patient/caregiver will verbalize/demonstrate measures taken to prevent injury and/or falls Date Initiated: 11/04/2021 Target Resolution Date: 12/06/2021 Goal Status: Active Interventions: Assess Activities of Daily Living upon admission and as needed Assess fall risk on admission and as needed Assess: immobility, friction, shearing, incontinence upon admission and as needed Assess impairment of mobility on admission and as needed per policy Assess personal safety and home safety (as indicated) on admission and as needed Provide education on fall prevention Provide education on personal and home safety Notes: Nutrition Nursing Diagnoses: Impaired glucose control: actual or potential Goals: Patient/caregiver will maintain therapeutic glucose control Date Initiated: 11/04/2021 Target Resolution Date: 12/06/2021 Goal Status: Active Interventions: Assess HgA1c results as ordered upon admission and as needed Assess patient nutrition upon admission and as needed per policy Provide education on elevated blood sugars and impact on wound healing Notes: Osteomyelitis Nursing Diagnoses: Infection: osteomyelitis Goals: Diagnostic evaluation for osteomyelitis completed as ordered Date Initiated: 11/14/2021 Target Resolution Date: 11/14/2021 Goal Status: Active Interventions: Assess for signs  and symptoms of osteomyelitis resolution every visit Provide education on osteomyelitis Screen for HBO Treatment Activities: Surgical debridement : 11/14/2021 Systemic antibiotics : 11/14/2021 X-ray : 11/04/2021 Notes: Wound/Skin Impairment Nursing Diagnoses: Impaired tissue integrity Knowledge deficit related to ulceration/compromised skin  integrity Goals: Patient/caregiver will verbalize understanding of skin care regimen Date Initiated: 11/04/2021 Target Resolution Date: 12/06/2021 Goal Status: Active Ulcer/skin breakdown will have a volume reduction of 30% by week 4 Date Initiated: 11/04/2021 Target Resolution Date: 12/06/2021 Goal Status: Active Interventions: Assess patient/caregiver ability to obtain necessary supplies Assess patient/caregiver ability to perform ulcer/skin care regimen upon admission and as needed Assess ulceration(s) every visit Provide education on ulcer and skin care Notes: Electronic Signature(s) Signed: 11/14/2021 4:48:29 PM By: Harold Pilling RN, [name] Entered By: Harold [name] on 11/14/2021 10:52:35 -------------------------------------------------------------------------------- Pain Assessment Details Patient Name: Date of Service: [address] Harold E. 11/14/2021 10:30 A M Medical Record Number: [id number] Patient Account Number: [id number] Date of Birth/Sex: Treating RN: 1931/11/20 (88 y.o. Harold [name] Primary Care Anberlin Diez: Harold [name] Other Clinician: Referring Khamani Fairley: Treating Copper Kirtley/Extender: Darl Pikes Weeks in Treatment: 1 Active Problems Location of Pain Severity and Description of Pain Patient Has Paino Yes Site Locations Pain Location: Pain Location: Pain in Ulcers With Dressing Change: Yes Duration of the Pain. Constant / Intermittento Intermittent Rate the pain. Current Pain Level: 5 Character of Pain Describe the Pain: Aching, Burning, Tender Pain Management and Medication Current Pain Management: Medication: Yes Cold Application: No Rest: Yes Massage: No Activity: No T.KingN.S.: No Heat Application: No Leg drop or elevation: No Is the Current Pain Management Adequate: Adequate How does your wound impact your activities of daily livingo Sleep: No Bathing: No Appetite: No Relationship With Others: No Bladder  Continence: No Emotions: No Bowel Continence: No Work: No Toileting: No Drive: No Dressing: No Hobbies: No Electronic Signature(s) Signed: 11/14/2021 3:50:29 PM By: Harold [name] Entered By: Harold [name] on 11/14/2021 10:33:32 -------------------------------------------------------------------------------- Patient/Caregiver Education Details Patient Name: Date of Service: [address] 6/1/2023andnbsp10:30 A M Medical Record Number: [id number] Patient Account Number: [id number] Date of Birth/Gender: Treating RN: 1932/04/24 (88 y.o. Harold [name] Primary Care Physician: Harold [name] Other Clinician: Referring Physician: Treating Physician/Extender: Adline Mango in Treatment: 1 Education Assessment Education Provided To: Patient Education Topics Provided Elevated Blood Sugar/ Impact on Healing: Handouts: Elevated Blood Sugars: How Do They Affect Wound Healing Methods: Explain/Verbal Responses: Reinforcements needed Infection: Handouts: CDC antimicrobial patient education_English, Infection Prevention and Management Methods: Explain/Verbal Responses: Reinforcements needed Electronic Signature(s) Signed: 11/14/2021 4:48:29 PM By: Harold Pilling RN, [name] Entered By: Harold [name] on 11/14/2021 10:53:37 -------------------------------------------------------------------------------- Wound Assessment Details Patient Name: Date of Service: Harold [name] Harold E. 11/14/2021 10:30 A M Medical Record Number: [id number] Patient Account Number: [id number] Date of Birth/Sex: Treating RN: 10/16/1931 (88 y.o. Harold [name] Primary Care Leonila Speranza: Harold [name] Other Clinician: Referring Ola Fawver: Treating Hy Swiatek/Extender: Darl Pikes Weeks in Treatment: 1 Wound Status Wound Number: 1 Primary Diabetic Wound/Ulcer of the Lower Extremity Etiology: Wound Location: Left, Medial Foot Wound  Open Wounding Event: Pressure Injury Status: Date Acquired: 09/10/2021 Comorbid Coronary Artery Disease, Hypertension, Myocardial Infarction, Weeks Of Treatment: 1 History: Peripheral Arterial Disease, Type II Diabetes Clustered Wound: No Photos Wound Measurements Length: (cm) 2.7 Width: (cm) 2.8 Depth: (cm) 0.1 Area: (cm) 5.938 Volume: (cm) 0.594 % Reduction in Area: -3.7% % Reduction in Volume: -3.7% Epithelialization: None Tunneling: No Undermining: No Wound  Description Classification: Unable to visualize wound bed Wound Margin: Distinct, outline attached Exudate Amount: Medium Exudate Type: Purulent Exudate Color: yellow, brown, green Foul Odor After Cleansing: No Slough/Fibrino Yes Wound Bed Granulation Amount: Small (1-33%) Exposed Structure Granulation Quality: Red Fascia Exposed: No Necrotic Amount: Large (67-100%) Fat Layer (Subcutaneous Tissue) Exposed: Yes Necrotic Quality: Adherent Slough Tendon Exposed: No Muscle Exposed: No Joint Exposed: No Bone Exposed: No Treatment Notes Wound #1 (Foot) Wound Laterality: Left, Medial Cleanser Soap and Water Discharge Instruction: May shower and wash wound with dial antibacterial soap and water prior to dressing change. Wound Cleanser Discharge Instruction: Cleanse the wound with wound cleanser prior to applying a clean dressing using gauze sponges, not tissue or cotton balls. Peri-Wound Care Topical Primary Dressing Santyl Ointment Discharge Instruction: Apply nickel thick amount to wound bed as instructed Secondary Dressing Woven Gauze Sponge, Non-Sterile 4x4 in Discharge Instruction: Apply over primary dressing as directed. Secured With Conforming Stretch Gauze Bandage, Sterile 2x75 (in/in) Discharge Instruction: Secure with stretch gauze as directed. 53M Medipore Soft Cloth Surgical T 2x10 (in/Harold) ape Discharge Instruction: Secure with tape as directed. Compression Wrap Compression  Stockings Add-Ons Electronic Signature(s) Signed: 11/14/2021 3:50:29 PM By: Harold [name] Entered By: Harold [name] on 11/14/2021 10:40:25 -------------------------------------------------------------------------------- Vitals Details Patient Name: Date of Service: [address], [place] Harold E. 11/14/2021 10:30 A M Medical Record Number: [id number] Patient Account Number: [id number] Date of Birth/Sex: Treating RN: [birth year] (88 y.o. Harold [name] Primary Care Dvaughn Fickle: Harold [name] Other Clinician: Referring Alyaan Budzynski: Treating Denard Tuminello/Extender: Darl Pikes Weeks in Treatment: 1 Vital Signs Time Taken: 10:33 Temperature (F): 97.9 Height (in): 71 Pulse (bpm): 52 Weight (lbs): 135 Respiratory Rate (breaths/min): 18 Body Mass Index (BMI): 18.8 Blood Pressure (mmHg): 127/49 Capillary Blood Glucose (mg/dl): 127 Reference Range: 80 - 120 mg / dl Electronic Signature(s) Signed: 11/14/2021 3:50:29 PM By: Harold [name] Entered By: Harold [name] on 11/14/2021 10:34:17

## 2021-11-15 ENCOUNTER — Other Ambulatory Visit: Payer: Self-pay

## 2021-11-15 ENCOUNTER — Encounter: Payer: Self-pay | Admitting: Internal Medicine

## 2021-11-15 ENCOUNTER — Ambulatory Visit: Payer: Medicare Other | Admitting: Internal Medicine

## 2021-11-15 DIAGNOSIS — L89892 Pressure ulcer of other site, stage 2: Secondary | ICD-10-CM | POA: Diagnosis not present

## 2021-11-15 DIAGNOSIS — M1380 Other specified arthritis, unspecified site: Secondary | ICD-10-CM | POA: Diagnosis not present

## 2021-11-15 DIAGNOSIS — E785 Hyperlipidemia, unspecified: Secondary | ICD-10-CM | POA: Diagnosis not present

## 2021-11-15 DIAGNOSIS — M86172 Other acute osteomyelitis, left ankle and foot: Secondary | ICD-10-CM | POA: Diagnosis not present

## 2021-11-15 DIAGNOSIS — I2510 Atherosclerotic heart disease of native coronary artery without angina pectoris: Secondary | ICD-10-CM | POA: Diagnosis not present

## 2021-11-15 DIAGNOSIS — I739 Peripheral vascular disease, unspecified: Secondary | ICD-10-CM | POA: Diagnosis not present

## 2021-11-15 DIAGNOSIS — Z7984 Long term (current) use of oral hypoglycemic drugs: Secondary | ICD-10-CM | POA: Diagnosis not present

## 2021-11-15 DIAGNOSIS — I10 Essential (primary) hypertension: Secondary | ICD-10-CM | POA: Diagnosis not present

## 2021-11-15 DIAGNOSIS — Z48812 Encounter for surgical aftercare following surgery on the circulatory system: Secondary | ICD-10-CM | POA: Diagnosis not present

## 2021-11-15 DIAGNOSIS — E119 Type 2 diabetes mellitus without complications: Secondary | ICD-10-CM | POA: Diagnosis not present

## 2021-11-15 DIAGNOSIS — K219 Gastro-esophageal reflux disease without esophagitis: Secondary | ICD-10-CM | POA: Diagnosis not present

## 2021-11-15 MED ORDER — DOXYCYCLINE HYCLATE 100 MG PO CAPS: 100.0000 mg | ORAL_CAPSULE | Freq: Two times a day (BID) | ORAL | 1 refills | Status: DC

## 2021-11-15 MED ORDER — CEFADROXIL 500 MG PO CAPS
1000.0000 mg | ORAL_CAPSULE | Freq: Two times a day (BID) | ORAL | 1 refills | Status: DC
Start: 2021-11-15 — End: 2021-12-16

## 2021-11-15 NOTE — Progress Notes (Signed)
[facility]      Reason for Consult:acute osteomyelitis    Referring Physician: Dr. Heber Olcott    Patient ID: Harold [name], male    DOB: 08-21-1931, 88 y.o.   MRN: [id number]  HPI:   He is here for evaluation of erosion on the medial aspect of the great toe metatarsal head.  He has a history of diabetes with his last Hgb A1c of 6.7 and PAD s/p fem/pop by Dr. Stanford Breed on 10/24/21.  He developed an ulcer of his left toe in March and now with a new ulcerative area and xray concerning for erosion/osteomyelitis.  He has recently been on amoxicillin (probably with clavulanate) but did not tolerate it well with GI upset.   Here with his daughter.    Past Medical History:  Diagnosis Date   Arthritis    "generalized" (03/01/2013)   CAD (coronary artery disease)    Cataract    GERD (gastroesophageal reflux disease)    HOH (hard of hearing)    Hyperlipidemia    Myocardial infarction ([place]) 06/16/2002   PONV (postoperative nausea and vomiting)    Type II diabetes mellitus ([place])    Unspecified essential hypertension     Prior to Admission medications   Medication Sig Start Date End Date Taking? Authorizing Provider  ACCU-CHEK AVIVA PLUS test strip  08/30/21   [provider]  Accu-Chek Softclix Lancets lancets SMARTSIG:Topical 06/06/21   [provider]  amLODipine (NORVASC) 5 MG tablet Take 5 mg by mouth daily. 08/14/21   [provider]  aspirin EC 81 MG tablet Take 81 mg by mouth daily.    [provider]  Cholecalciferol (VITAMIN D3) 50 MCG (2000 UT) TABS Take 2,000 Units by mouth daily.    [provider]  dorzolamide-timolol (COSOPT) 22.3-6.8 MG/ML ophthalmic solution 1 drop 2 (two) times daily. 08/08/21   [provider]  gabapentin (NEURONTIN) 100 MG capsule Take 100 mg by mouth 3 (three) times daily. 09/09/21   [provider]  latanoprost (XALATAN) 0.005 % ophthalmic solution Place 1 drop into  both eyes at bedtime. 09/17/21   [provider]  lidocaine (XYLOCAINE) 2 % jelly Apply 1 application. topically 3 (three) times daily as needed. 08/23/21   [provider]  lidocaine (XYLOCAINE) 5 % ointment 1 application. daily as needed for mild pain or moderate pain. 08/28/21   [provider]  lisinopril-hydrochlorothiazide (ZESTORETIC) 20-25 MG tablet Take 1 tablet by mouth daily. 06/16/21   [provider]  metFORMIN (GLUCOPHAGE) 500 MG tablet Take 500 mg by mouth 2 (two) times daily with a meal.    [provider]  oxyCODONE-acetaminophen (PERCOCET/ROXICET) 5-325 MG tablet Take 1 tablet by mouth every 6 (six) hours as needed for moderate pain. 10/26/21   Ulyses Amor, PA-C  pantoprazole (PROTONIX) 40 MG tablet Take 40 mg by mouth daily. 08/26/21   [provider]  simvastatin (ZOCOR) 80 MG tablet Take 40 mg by mouth at bedtime.    [provider]    No Known Allergies  Social History   Tobacco Use   Smoking status: Former    Years: 30.00    Types: Cigarettes   Smokeless tobacco: Never  Vaping Use   Vaping Use: Never used  Substance Use Topics   Alcohol use: Yes    Comment: 03/01/2013 "drank years ago; never had problem w/it"   Drug use: No    Family History  Problem Relation Age of  Onset   Heart disease Mother    Diabetes Mother     Review of Systems  Constitutional: negative for fevers and chills Gastrointestinal: negative for nausea and diarrhea All other systems reviewed and are negative    Constitutional: in no apparent distress There were no vitals filed for this visit. EYES: anicteric Respiratory: normal respiratory effort Musculoskeletal: left metatarsal with open ulcer, no surrounding erythema, no pus.    Labs: Lab Results  Component Value Date   WBC 8.7 10/25/2021   HGB 10.9 (L) 10/25/2021   HCT 32.2 (L) 10/25/2021   MCV 92.5 10/25/2021   PLT 235 10/25/2021    Lab Results  Component Value  Date   CREATININE 0.98 10/25/2021   BUN 16 10/25/2021   NA 135 10/25/2021   K 3.9 10/25/2021   CL 105 10/25/2021   CO2 22 10/25/2021    Lab Results  Component Value Date   ALT 9 10/24/2021   AST 17 10/24/2021   ALKPHOS 32 (L) 10/24/2021   BILITOT 0.7 10/24/2021   INR 1.5 (H) 10/24/2021     Assessment: acute osteomyelitis with recent ulcer from his shoe.  He is also s/p successful fempop so has good blood flow. I discussed treatment options and will start him on doxycycline and cefadroxil and will have him come back in about 2 weeks to recheck.    Plan: 1)  doxycycline + cefadroxil 2) esr, crp, bmp

## 2021-11-16 LAB — BASIC METABOLIC PANEL
BUN: 20 mg/dL (ref 7–25)
CO2: 29 mmol/L (ref 20–32)
Calcium: 9.6 mg/dL (ref 8.6–10.3)
Chloride: 106 mmol/L (ref 98–110)
Creat: 1.05 mg/dL (ref 0.70–1.22)
Glucose, Bld: 206 mg/dL — ABNORMAL HIGH (ref 65–99)
Potassium: 4.1 mmol/L (ref 3.5–5.3)
Sodium: 143 mmol/L (ref 135–146)

## 2021-11-16 LAB — SEDIMENTATION RATE: Sed Rate: 19 mm/h (ref 0–20)

## 2021-11-16 LAB — C-REACTIVE PROTEIN: CRP: 5.1 mg/L (ref <8.0)

## 2021-11-19 ENCOUNTER — Other Ambulatory Visit: Payer: Self-pay | Admitting: *Deleted

## 2021-11-19 DIAGNOSIS — E119 Type 2 diabetes mellitus without complications: Secondary | ICD-10-CM | POA: Diagnosis not present

## 2021-11-19 DIAGNOSIS — I10 Essential (primary) hypertension: Secondary | ICD-10-CM | POA: Diagnosis not present

## 2021-11-19 DIAGNOSIS — E785 Hyperlipidemia, unspecified: Secondary | ICD-10-CM | POA: Diagnosis not present

## 2021-11-19 DIAGNOSIS — M1380 Other specified arthritis, unspecified site: Secondary | ICD-10-CM | POA: Diagnosis not present

## 2021-11-19 DIAGNOSIS — I739 Peripheral vascular disease, unspecified: Secondary | ICD-10-CM | POA: Diagnosis not present

## 2021-11-19 DIAGNOSIS — Z48812 Encounter for surgical aftercare following surgery on the circulatory system: Secondary | ICD-10-CM | POA: Diagnosis not present

## 2021-11-19 DIAGNOSIS — I2510 Atherosclerotic heart disease of native coronary artery without angina pectoris: Secondary | ICD-10-CM | POA: Diagnosis not present

## 2021-11-19 DIAGNOSIS — K219 Gastro-esophageal reflux disease without esophagitis: Secondary | ICD-10-CM | POA: Diagnosis not present

## 2021-11-19 DIAGNOSIS — L89892 Pressure ulcer of other site, stage 2: Secondary | ICD-10-CM | POA: Diagnosis not present

## 2021-11-19 DIAGNOSIS — I7025 Atherosclerosis of native arteries of other extremities with ulceration: Secondary | ICD-10-CM

## 2021-11-19 DIAGNOSIS — Z7984 Long term (current) use of oral hypoglycemic drugs: Secondary | ICD-10-CM | POA: Diagnosis not present

## 2021-11-20 ENCOUNTER — Encounter (HOSPITAL_BASED_OUTPATIENT_CLINIC_OR_DEPARTMENT_OTHER): Payer: Medicare Other | Admitting: Internal Medicine

## 2021-11-20 DIAGNOSIS — E11621 Type 2 diabetes mellitus with foot ulcer: Secondary | ICD-10-CM | POA: Diagnosis not present

## 2021-11-20 DIAGNOSIS — M86672 Other chronic osteomyelitis, left ankle and foot: Secondary | ICD-10-CM | POA: Diagnosis not present

## 2021-11-20 DIAGNOSIS — I70245 Atherosclerosis of native arteries of left leg with ulceration of other part of foot: Secondary | ICD-10-CM | POA: Diagnosis not present

## 2021-11-20 DIAGNOSIS — Z7984 Long term (current) use of oral hypoglycemic drugs: Secondary | ICD-10-CM | POA: Diagnosis not present

## 2021-11-20 DIAGNOSIS — L97508 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 with other specified severity: Secondary | ICD-10-CM | POA: Diagnosis not present

## 2021-11-20 DIAGNOSIS — E1169 Type 2 diabetes mellitus with other specified complication: Secondary | ICD-10-CM | POA: Diagnosis not present

## 2021-11-20 DIAGNOSIS — E1151 Type 2 diabetes mellitus with diabetic peripheral angiopathy without gangrene: Secondary | ICD-10-CM | POA: Diagnosis not present

## 2021-11-20 DIAGNOSIS — Z87891 Personal history of nicotine dependence: Secondary | ICD-10-CM | POA: Diagnosis not present

## 2021-11-20 DIAGNOSIS — L97522 Non-pressure chronic ulcer of other part of left foot with fat layer exposed: Secondary | ICD-10-CM | POA: Diagnosis not present

## 2021-11-20 NOTE — Progress Notes (Signed)
Harold, [name] ([id number]) Visit Report for 11/20/2021 Debridement Details Patient Name: Date of Service: Harold [name], Harold [name]. 11/20/2021 3:00 PM Medical Record Number: [id number] Patient Account Number: [id number] Date of Birth/Sex: Treating RN: 1931/11/27 (88 y.o. Harold [name] Primary Care Provider: Henrine [name] Other Clinician: Referring Provider: Treating Provider/Extender: Harold [name] Weeks in Treatment: 2 Debridement Performed for Assessment: Wound #1 Left,Medial Foot Performed By: Clinician Harold Hurst, RN Debridement Type: Chemical/Enzymatic/Mechanical Agent Used: Santyl Severity of Tissue Pre Debridement: Fat layer exposed Level of Consciousness (Pre-procedure): Awake and Alert Pre-procedure Verification/Time Out Yes - 16:15 Taken: Start Time: 16:15 Bleeding: None End Time: 16:15 Procedural Pain: 0 Post Procedural Pain: 0 Response to Treatment: Procedure was tolerated well Level of Consciousness (Post- Awake and Alert procedure): Post Debridement Measurements of Total Wound Length: (cm) 2.7 Width: (cm) 3 Depth: (cm) 0.1 Volume: (cm) 0.636 Character of Wound/Ulcer Post Debridement: Requires Further Debridement Severity of Tissue Post Debridement: Fat layer exposed Post Procedure Diagnosis Same as Pre-procedure Electronic Signature(s) Signed: 11/20/2021 5:09:19 PM By: Harold Ham MD Signed: 11/20/2021 5:22:20 PM By: Harold [name] Entered By: Harold [name] on 11/20/2021 16:15:59 -------------------------------------------------------------------------------- HPI Details Patient Name: Date of Service: Harold [name], Harold [name] [name] 11/20/2021 3:00 PM Medical Record Number: [id number] Patient Account Number: [id number] Date of Birth/Sex: Treating RN: 30-Sep-1931 (88 y.o. Harold [name] Primary Care Provider: Henrine [name] Other Clinician: Referring Provider: Treating Provider/Extender: Harold [name] Weeks in Treatment: 2 History of Present Illness HPI Description: Admission 11/04/2021 [name] is an 88 year old male with a past medical history of peripheral arterial disease, type 2 diabetes controlled on oral agents, former tobacco user that presents to the clinic for a medial left foot wound. He states that the wound started in March 2023. He noticed his foot was rubbing against in inside of his shoe creating a wound. He currently wears soft slippers to address this issue. He has been following with his podiatrist Harold [name] for this issue. On 09/19/2021 he had ABIs that were noncompressible with a TBI of 0.3 on the left. He subsequently had an abdominal aortogram on 4/28 that led to a femoropopliteal on 10/24/2021 by Harold [name]. He has follow-up with vein and vascular on 11/26/2021. Patient reports chronic pain to the wound site. He has tried Iodoflex, Medihoney and antibiotic ointment to the wound bed with little benefit. 6/1; patient presents for follow-up. He obtained his x-ray that showed new cortical erosion within the medial aspect of the great toe metatarsal head concerning for acute osteomyelitis. He has been using Santyl to the wound bed. He continues to report chronic pain to the wound site. He currently denies signs of infection. 6/7; he continues with Santyl as the primary dressing which his daughter is changing there has been some improvement in the surface condition of the wound. He tolerates debridement very poorly. He is tolerating the antibiotics prescribed by infectious disease which is doxycycline and cefadroxil. He has follow-up arterial studies next week status post left Pham [name] in April Electronic Signature(s) Signed: 11/20/2021 5:09:19 PM By: Harold Ham MD Entered By: Harold [name] on 11/20/2021 16:39:45 -------------------------------------------------------------------------------- Physical Exam Details Patient Name: Date of  Service: [address] [name] 11/20/2021 3:00 PM Medical Record Number: [id number] Patient Account Number: [id number] Date of Birth/Sex: Treating RN: [birth year] (88 y.o. Harold [name] Primary Care Provider: Henrine [name] Other Clinician: Referring Provider: Treating Provider/Extender: Harold [name]  Weeks in Treatment: 2 Constitutional Sitting or standing Blood Pressure is within target range for patient.. Pulse regular and within target range for patient.Marland Kitchen Respirations regular, non-labored and within target range.. Temperature is normal and within the target range for the patient.Marland Kitchen Appears in no distress. Notes Wound exam left foot medial aspect over the first metatarsal head. There is better looking granulation at the superior part of this but still the vast majority of this is covered in a very adherent nonviable necrotic surface. I did not attempt debridement today and like to give the sample more time perhaps make a future mechanical debridement easier. Notable that he has palpable pulses status post revascularization this is a hopeful sign Electronic Signature(s) Signed: 11/20/2021 5:09:19 PM By: Harold Ham MD Entered By: Harold [name] on 11/20/2021 16:42:38 -------------------------------------------------------------------------------- Physician Orders Details Patient Name: Date of Service: [address] [name] 11/20/2021 3:00 PM Medical Record Number: [id number] Patient Account Number: [id number] Date of Birth/Sex: Treating RN: 1931/09/01 (88 y.o. 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 - Harold [name] and [place], Room 8 Tuesday 1115am 11/26/2021 Return A Bathing/ Shower/ Hygiene May shower and wash wound with soap and water. - prior to dressing change Edema Control - Lymphedema / SCD / Other Elevate legs to the level of the heart or above for 30 minutes daily and/or when sitting, a frequency of: - throughout the day Avoid standing for long periods of time. Moisturize legs daily. Off-Loading Other: - Avoid any direct pressure to left foot Home Health No change in wound care orders this week; continue Home Health for wound care. May utilize formulary equivalent dressing for wound treatment orders unless otherwise specified. - Apply Santyl to wound on left foot daily (home health to change 2-3x a week, daughter will change rest of the week) Other Home Health Orders/Instructions: - [facility] Wound Treatment Wound #1 - Foot Wound Laterality: Left, Medial Cleanser: Soap and Water 1 x Per Day/15 Days Discharge Instructions: May shower and wash wound with dial antibacterial soap and water prior to dressing change. Cleanser: Wound Cleanser 1 x Per Day/15 Days Discharge Instructions: Cleanse the wound with wound cleanser prior to applying a clean dressing using gauze sponges, not tissue or cotton balls. Peri-Wound Care: Zinc Oxide Ointment 30g tube 1 x Per Day/15 Days Discharge Instructions: Apply Zinc Oxide to periwound with each dressing change Prim Dressing: Santyl Ointment 1 x Per Day/15 Days ary Discharge Instructions: Apply nickel thick amount to wound bed as instructed Secondary Dressing: Woven Gauze Sponge, Non-Sterile 4x4 in 1 x Per Day/15 Days Discharge Instructions: Apply over primary dressing as directed. Secured With: Child psychotherapist, Sterile 2x75 (in/in) 1 x Per Day/15 Days Discharge Instructions:  Secure with stretch gauze as directed. Secured With: 29M [employer] Public affairs consultant Surgical T 2x10 (in/yd) 1 x Per Day/15 Days ape Discharge Instructions: Secure with tape as directed. Electronic Signature(s) Signed: 11/20/2021 5:09:19 PM By: Harold Ham MD Signed: 11/20/2021 5:22:20 PM By: Harold [name] Entered By: Harold [name] on 11/20/2021 16:14:11 -------------------------------------------------------------------------------- Problem List Details Patient Name: Date of Service:  [place], [place] [name] 11/20/2021 3:00 PM Medical Record Number: [id number] Patient Account Number: [id number] Date of Birth/Sex: Treating RN: [birth year] (88 y.o. 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 11/20/2021 3:00 PM Medical Record Number: [id number] Patient Account Number: [id number] Date of Birth/Sex: Treating RN: 12-07-1931 (88 y.o. Harold [name] Primary Care Provider: Henrine [name] Other Clinician: Referring Provider: Treating Provider/Extender: Harold [name] Weeks in Treatment: 2 Subjective History of Present Illness  (HPI) Admission 11/04/2021 [name] is an 88 year old male with a past medical history of peripheral arterial disease, type 2 diabetes controlled on oral agents, former tobacco user that presents to the clinic for a medial left foot wound. He states that the wound started in March 2023. He noticed his foot was rubbing against in inside of his shoe creating a wound. He currently wears soft slippers to address this issue. He has been following with his podiatrist Harold [name] for this issue. On 09/19/2021 he had ABIs that were noncompressible with a TBI of 0.3 on the left. He subsequently had an abdominal aortogram on 4/28 that led to a femoropopliteal on 10/24/2021 by Harold [name]. He has follow-up with vein and vascular on 11/26/2021. Patient reports chronic pain to the wound site. He has tried Iodoflex, Medihoney and antibiotic ointment to the wound bed with little benefit. 6/1; patient presents for follow-up. He obtained his x-ray that showed new cortical erosion within the medial aspect of the great toe metatarsal head concerning for acute osteomyelitis. He has been using Santyl to the wound bed. He continues to report chronic pain to the wound site. He currently denies signs of infection. 6/7; he continues with Santyl as the primary dressing which his daughter is changing there has been some improvement in the surface condition of the wound. He tolerates debridement very poorly. He is tolerating the antibiotics prescribed by infectious disease which is doxycycline and cefadroxil. He has follow-up arterial studies next week status post left Pham [name] in April Objective Constitutional Sitting or standing Blood Pressure is within target range for patient.. Pulse regular and within target range for patient.Marland Kitchen Respirations regular, non-labored and within target range.. Temperature is normal and within the target range for the patient.Marland Kitchen Appears in no distress. Vitals Time Taken: 3:52 PM, Height: 71  in, Weight: 135 lbs, BMI: 18.8, Temperature: 98.0 F, Pulse: 69 bpm, Respiratory Rate: 18 breaths/min, Blood Pressure: 149/75 mmHg, Capillary Blood Glucose: 106 mg/dl. General Notes: glucose per pt report General Notes: Wound exam left foot medial aspect over the first metatarsal head. There is better looking granulation at the superior part of this but still the vast majority of this is covered in a very adherent nonviable necrotic surface. I did not attempt debridement today and like to give the sample more time perhaps make a future mechanical debridement easier. Notable that he has  palpable pulses status post revascularization this is a hopeful sign Integumentary (Hair, Skin) Wound #1 status is Open. Original cause of wound was Pressure Injury. The date acquired was: 09/10/2021. The wound has been in treatment 2 weeks. The wound is located on the Left,Medial Foot. The wound measures 2.7cm length x 3cm width x 0.1cm depth; 6.362cm^2 area and 0.636cm^3 volume. There is Fat Layer (Subcutaneous Tissue) exposed. There is no tunneling or undermining noted. There is a medium amount of serosanguineous drainage noted. The wound margin is distinct with the outline attached to the wound base. There is small (1-33%) red granulation within the wound bed. There is a large (67-100%) amount of necrotic tissue within the wound bed including Adherent Slough. 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 There was a Chemical/Enzymatic/Mechanical debridement performed by Harold Hurst, RN.Marland Kitchen Agent used was [employer]. A  time out was conducted at 16:15, prior to the start of the procedure. There was no bleeding. The procedure was tolerated well with a pain level of 0 throughout and a pain level of 0 following the procedure. Post Debridement Measurements: 2.7cm length x 3cm width x 0.1cm depth; 0.636cm^3 volume. Character of Wound/Ulcer Post Debridement requires further debridement. Severity of Tissue Post Debridement is: Fat layer exposed. Post procedure Diagnosis Wound #1: Same as Pre-Procedure Plan Follow-up Appointments: Return Appointment in 1 week. - Dr. Heber [place] and [place], Room 8 Tuesday 1115am 11/26/2021 Bathing/ Shower/ Hygiene: May shower and wash wound with soap and water. - prior to dressing change Edema Control - Lymphedema / SCD / Other: Elevate legs to the level of the heart or above for 30 minutes daily and/or when sitting, a frequency of: - throughout the day Avoid standing for long periods of time. Moisturize legs daily. Off-Loading: Other: - Avoid any direct pressure to left foot Home Health: No change in wound care orders this week; continue Home Health for wound care. May utilize formulary equivalent dressing for wound treatment orders unless otherwise specified. - Apply Santyl to wound on left foot daily (home health to change 2-3x a week, daughter will change rest of the week) Other Home Health Orders/Instructions: - NUUVOZD WOUND #1: - Foot Wound Laterality: Left, Medial Cleanser: Soap and Water 1 x Per Day/15 Days Discharge Instructions: May shower and wash wound with dial antibacterial soap and water prior to dressing change. Cleanser: Wound Cleanser 1 x Per Day/15 Days Discharge Instructions: Cleanse the wound with wound cleanser prior to applying a clean dressing using gauze sponges, not tissue or cotton balls. Peri-Wound Care: Zinc Oxide Ointment 30g tube 1 x Per Day/15 Days Discharge Instructions: Apply Zinc Oxide to periwound with each dressing change Prim Dressing: Santyl  Ointment 1 x Per Day/15 Days ary Discharge Instructions: Apply nickel thick amount to wound bed as instructed Secondary Dressing: Woven Gauze Sponge, Non-Sterile 4x4 in 1 x Per Day/15 Days Discharge Instructions: Apply over primary dressing as directed. Secured With: Child psychotherapist, Sterile 2x75 (in/in) 1 x Per Day/15 Days Discharge Instructions: Secure with stretch gauze as directed. Secured With: 86M [employer] Public affairs consultant Surgical T 2x10 (in/yd) 1 x Per Day/15 Days ape Discharge Instructions: Secure with  tape as directed. 1. I continued with the Santyl. 2. I did not attempt a mechanical debridement although he is eventually certainly going to need this 3. Follow-up arterial studies next week 4. He is wearing just socks at home but this is not really a weightbearing surface. I told his daughter to make sure that he is not rubbing this area on anything i.e. mattresses etc. 5. He continues on antibiotics that he is tolerating well. No obvious superficial infection Electronic Signature(s) Signed: 11/20/2021 5:09:19 PM By: Harold Ham MD Signed: 11/20/2021 5:09:19 PM By: Harold Ham MD Entered By: Harold [name] on 11/20/2021 16:43:46 -------------------------------------------------------------------------------- SuperBill Details Patient Name: Date of Service: Harold [name], [place] [name] 11/20/2021 Medical Record Number: [id number] Patient Account Number: [id number] Date of Birth/Sex: Treating RN: [birth year] (88 y.o. 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 with other specified severity I70.245 Atherosclerosis of native arteries of left leg with ulceration of  other part of foot Facility Procedures CPT4 Code: [id number] Description: [phone] - DEBRIDE W/O ANES NON SELECT Modifier: Quantity: 1 Physician Procedures : CPT4 Code Description Modifier [id number] - WC PHYS LEVEL 4 - EST PT ICD-10 Diagnosis Description E11.621 Type 2 diabetes mellitus with foot ulcer L97.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

## 2021-11-21 DIAGNOSIS — M1380 Other specified arthritis, unspecified site: Secondary | ICD-10-CM | POA: Diagnosis not present

## 2021-11-21 DIAGNOSIS — L89892 Pressure ulcer of other site, stage 2: Secondary | ICD-10-CM | POA: Diagnosis not present

## 2021-11-21 DIAGNOSIS — I2510 Atherosclerotic heart disease of native coronary artery without angina pectoris: Secondary | ICD-10-CM | POA: Diagnosis not present

## 2021-11-21 DIAGNOSIS — I739 Peripheral vascular disease, unspecified: Secondary | ICD-10-CM | POA: Diagnosis not present

## 2021-11-21 DIAGNOSIS — I10 Essential (primary) hypertension: Secondary | ICD-10-CM | POA: Diagnosis not present

## 2021-11-21 DIAGNOSIS — K219 Gastro-esophageal reflux disease without esophagitis: Secondary | ICD-10-CM | POA: Diagnosis not present

## 2021-11-21 DIAGNOSIS — Z48812 Encounter for surgical aftercare following surgery on the circulatory system: Secondary | ICD-10-CM | POA: Diagnosis not present

## 2021-11-21 DIAGNOSIS — E785 Hyperlipidemia, unspecified: Secondary | ICD-10-CM | POA: Diagnosis not present

## 2021-11-21 DIAGNOSIS — E119 Type 2 diabetes mellitus without complications: Secondary | ICD-10-CM | POA: Diagnosis not present

## 2021-11-21 DIAGNOSIS — Z7984 Long term (current) use of oral hypoglycemic drugs: Secondary | ICD-10-CM | POA: Diagnosis not present

## 2021-11-21 NOTE — Progress Notes (Signed)
TYMERE, DEPUY ([id number]) Visit Report for 11/20/2021 Arrival Information Details Patient Name: Date of Service: [place], [place]. 11/20/2021 3:00 PM Medical Record Number: [id number] Patient Account Number: [id number] Date of Birth/Sex: Treating RN: 08/25/1931 (88 y.o. [name], [name] Primary Care Zackariah Vanderpol: [name] Other Clinician: Referring Paige Monarrez: Treating Armanie Ullmer/Extender: Rowland Lathe Weeks in Treatment: 2 Visit Information History Since Last Visit Added or deleted any medications: No Patient Arrived: Gilford Rile Any new allergies or adverse reactions: No Arrival Time: 15:52 Had a fall or experienced change in No Accompanied By: daughter activities of daily living that may affect Transfer Assistance: None risk of falls: Patient Identification Verified: Yes Signs or symptoms of abuse/neglect since last visito No Secondary Verification Process Completed: Yes Hospitalized since last visit: No Patient Requires Transmission-Based Precautions: No Implantable device outside of the clinic excluding No Patient Has Alerts: Yes cellular tissue based products placed in the center Patient Alerts: R ABI [place] R TBI: 0.53 since last visit: L ABI [place] L TBI: 0.30 Has Dressing in Place as Prescribed: Yes Pain Present Now: Yes Electronic Signature(s) Signed: 11/20/2021 5:22:20 PM By: [name], [name] Entered By: Levan Hurst on 11/20/2021 15:53:19 -------------------------------------------------------------------------------- Encounter Discharge Information Details Patient Name: Date of Service: [name], [name] 11/20/2021 3:00 PM Medical Record Number: [id number] Patient Account Number: [id number] Date of Birth/Sex: Treating RN: 05/12/1932 (88 y.o. [name] Primary Care Chandler Stofer: [name] Other Clinician: Referring Kashia Brossard: Treating Yarieliz Wasser/Extender: Rowland Lathe Weeks in Treatment:  2 Encounter Discharge Information Items Post Procedure Vitals Discharge Condition: Stable Temperature (F): 98.6 Ambulatory Status: [place] Pulse (bpm): 89 Discharge Destination: Home Respiratory Rate (breaths/min): 18 Transportation: Private Auto Blood Pressure (mmHg): 139/77 Accompanied By: daughter Schedule Follow-up Appointment: Yes Clinical Summary of Care: Patient Declined Electronic Signature(s) Signed: 11/20/2021 5:22:20 PM By: [name], [name] Entered By: Levan Hurst on 11/20/2021 16:46:47 -------------------------------------------------------------------------------- Lower Extremity Assessment Details Patient Name: Date of Service: [place], [place]. 11/20/2021 3:00 PM Medical Record Number: [id number] Patient Account Number: [id number] Date of Birth/Sex: Treating RN: 06-Oct-1931 (88 y.o. [name] Primary Care Weston Fulco: [name] Other Clinician: Referring Daeton Kluth: Treating Irja Wheless/Extender: Rowland Lathe Weeks in Treatment: 2 Edema Assessment Assessed: [Left: No] [Right: No] Edema: [Left: Ye] [Right: s] Calf Left: Right: Point of Measurement: From Medial Instep 35 cm Ankle Left: Right: Point of Measurement: From Medial Instep 23 cm Vascular Assessment Pulses: Dorsalis Pedis Palpable: [Left:Yes] Electronic Signature(s) Signed: 11/20/2021 5:22:20 PM By: [name], [name] Entered By: Levan Hurst on 11/20/2021 15:54:48 -------------------------------------------------------------------------------- Multi Wound Chart Details Patient Name: Date of Service: Lessie Dings [name] 11/20/2021 3:00 PM Medical Record Number: [id number] Patient Account Number: [id number] Date of Birth/Sex: Treating RN: 1932-03-15 (88 y.o. [name] Primary Care Jonus Coble: [name] Other Clinician: Referring Goku Harb: Treating Oniel Meleski/Extender: Rowland Lathe Weeks in Treatment:  2 Vital Signs Height(in): 71 Capillary Blood Glucose(mg/dl): 106 Weight(lbs): 135 Pulse(bpm): 104 Body Mass Index(BMI): 18.8 Blood Pressure(mmHg): 149/75 Temperature(F): 98.0 Respiratory Rate(breaths/min): 18 Photos: [1:Left, Medial Foot] [N/A:N/A N/A] Wound Location: [1:Pressure Injury] [N/A:N/A] Wounding Event: [1:Diabetic Wound/Ulcer of the Lower] [N/A:N/A] Primary Etiology: [1:Extremity Coronary Artery Disease,] [N/A:N/A] Comorbid History: [1:Hypertension, Myocardial Infarction, Peripheral Arterial Disease, Type II Diabetes 09/10/2021] [N/A:N/A] Date Acquired: [1:2] [N/A:N/A] Weeks of Treatment: [1:Open] [N/A:N/A] Wound Status: [1:No] [N/A:N/A] Wound Recurrence: [1:2.7x3x0.1] [N/A:N/A] Measurements L x W x D (cm) [1:6.362] [N/A:N/A] A (  cm) : rea [1:0.636] [N/A:N/A] Volume (cm) : [1:-11.10%] [N/A:N/A] % Reduction in A [1:rea: -11.00%] [N/A:N/A] % Reduction in Volume: [1:Unable to visualize wound bed] [N/A:N/A] Classification: [1:Medium] [N/A:N/A] Exudate A mount: [1:Serosanguineous] [N/A:N/A] Exudate Type: [1:red, brown] [N/A:N/A] Exudate Color: [1:Distinct, outline attached] [N/A:N/A] Wound Margin: [1:Small (1-33%)] [N/A:N/A] Granulation A mount: [1:Red] [N/A:N/A] Granulation Quality: [1:Large (67-100%)] [N/A:N/A] Necrotic A mount: [1:Fat Layer (Subcutaneous Tissue): Yes N/A] Exposed Structures: [1:Fascia: No Tendon: No Muscle: No Joint: No Bone: No Small (1-33%)] [N/A:N/A] Epithelialization: [1:Chemical/Enzymatic/Mechanical] [N/A:N/A] Debridement: Pre-procedure Verification/Time Out 16:15 [N/A:N/A] Taken: [1:N/A] [N/A:N/A] Instrument: [1:None] [N/A:N/A] Bleeding: [1:0] [N/A:N/A] Procedural Pain: [1:0] [N/A:N/A] Post Procedural Pain: [1:Procedure was tolerated well] [N/A:N/A] Debridement Treatment Response: [1:2.7x3x0.1] [N/A:N/A] Post Debridement Measurements L x W x D (cm) [1:0.636] [N/A:N/A] Post Debridement Volume: (cm) [1:Debridement] [N/A:N/A] Treatment  Notes Electronic Signature(s) Signed: 11/20/2021 5:06:31 PM By: Deon Pilling RN, [name] Signed: 11/20/2021 5:09:19 PM By: Linton Ham MD Entered By: Linton Ham on 11/20/2021 16:38:45 -------------------------------------------------------------------------------- Multi-Disciplinary Care Plan Details Patient Name: Date of Service: [name], [place] [name] 11/20/2021 3:00 PM Medical Record Number: [id number] Patient Account Number: [id number] Date of Birth/Sex: Treating RN: 1932/05/16 (88 y.o. [name] Primary Care Ralynn San: [name] Other Clinician: Referring Usama Harkless: Treating Mickala Laton/Extender: Rowland Lathe Weeks in Treatment: 2 Multidisciplinary Care Plan reviewed with physician Active Inactive Abuse / Safety / Falls / Self Care Management Nursing Diagnoses: Potential for falls Potential for injury related to falls Goals: Patient will not experience any injury related to falls Date Initiated: 11/04/2021 Target Resolution Date: 12/06/2021 Goal Status: Active Patient/caregiver will verbalize/demonstrate measures taken to prevent injury and/or falls Date Initiated: 11/04/2021 Target Resolution Date: 12/06/2021 Goal Status: Active Interventions: Assess Activities of Daily Living upon admission and as needed Assess fall risk on admission and as needed Assess: immobility, friction, shearing, incontinence upon admission and as needed Assess impairment of mobility on admission and as needed per policy Assess personal safety and home safety (as indicated) on admission and as needed Provide education on fall prevention Provide education on personal and home safety Notes: Nutrition Nursing Diagnoses: Impaired glucose control: actual or potential Goals: Patient/caregiver will maintain therapeutic glucose control Date Initiated: 11/04/2021 Target Resolution Date: 12/06/2021 Goal Status: Active Interventions: Assess HgA1c results as  ordered upon admission and as needed Assess patient nutrition upon admission and as needed per policy Provide education on elevated blood sugars and impact on wound healing Notes: Osteomyelitis Nursing Diagnoses: Infection: osteomyelitis Goals: Diagnostic evaluation for osteomyelitis completed as ordered Date Initiated: 11/14/2021 Target Resolution Date: 11/14/2021 Goal Status: Active Interventions: Assess for signs and symptoms of osteomyelitis resolution every visit Provide education on osteomyelitis Screen for HBO Treatment Activities: Surgical debridement : 11/14/2021 Systemic antibiotics : 11/14/2021 X-ray : 11/04/2021 Notes: Wound/Skin Impairment Nursing Diagnoses: Impaired tissue integrity Knowledge deficit related to ulceration/compromised skin integrity Goals: Patient/caregiver will verbalize understanding of skin care regimen Date Initiated: 11/04/2021 Target Resolution Date: 12/06/2021 Goal Status: Active Ulcer/skin breakdown will have a volume reduction of 30% by week 4 Date Initiated: 11/04/2021 Target Resolution Date: 12/06/2021 Goal Status: Active Interventions: Assess patient/caregiver ability to obtain necessary supplies Assess patient/caregiver ability to perform ulcer/skin care regimen upon admission and as needed Assess ulceration(s) every visit Provide education on ulcer and skin care Notes: Electronic Signature(s) Signed: 11/20/2021 5:22:20 PM By: [name], [name] Entered By: Levan Hurst on 11/20/2021 15:56:55 -------------------------------------------------------------------------------- Pain Assessment Details Patient Name: Date of Service: [name], [place] [name] 11/20/2021 3:00 PM Medical Record  Number: [id number] Patient Account Number: [id number] Date of Birth/Sex: Treating RN: August 05, 1931 (88 y.o. [name] Primary Care Kessler Solly: [name] Other Clinician: Referring Lucile Didonato: Treating Latiesha Harada/Extender: Rowland Lathe Weeks in Treatment: 2 Active Problems Location of Pain Severity and Description of Pain Patient Has Paino Yes Site Locations Pain Location: Pain in Ulcers With Dressing Change: Yes Duration of the Pain. Constant / Intermittento Intermittent Character of Pain Describe the Pain: Throbbing Pain Management and Medication Current Pain Management: Medication: Yes Cold Application: No Rest: No Massage: No Activity: No T.E.N.S.: No Heat Application: No Leg drop or elevation: No Is the Current Pain Management Adequate: Adequate How does your wound impact your activities of daily livingo Sleep: No Bathing: No Appetite: No Relationship With Others: No Bladder Continence: No Emotions: No Bowel Continence: No Work: No Toileting: No Drive: No Dressing: No Hobbies: No Electronic Signature(s) Signed: 11/20/2021 5:22:20 PM By: [name], [name] Entered By: Levan Hurst on 11/20/2021 15:54:35 -------------------------------------------------------------------------------- Patient/Caregiver Education Details Patient Name: Date of Service: [address] 6/7/2023andnbsp3:00 PM Medical Record Number: [id number] Patient Account Number: [id number] Date of Birth/Gender: Treating RN: 10-24-1931 (89 y.o. [name] Primary Care Physician: [name] Other Clinician: Referring Physician: Treating Physician/Extender: Earl Many in Treatment: 2 Education Assessment Education Provided To: Patient Education Topics Provided Wound/Skin Impairment: Methods: Explain/Verbal Responses: State content correctly Electronic Signature(s) Signed: 11/20/2021 5:22:20 PM By: [name], [name] Entered By: Levan Hurst on 11/20/2021 15:57:15 -------------------------------------------------------------------------------- Wound Assessment Details Patient Name: Date of Service: Lessie Dings [name]  11/20/2021 3:00 PM Medical Record Number: [id number] Patient Account Number: [id number] Date of Birth/Sex: Treating RN: 12-Feb-1932 (87 y.o. Burnadette Pop, Lauren Primary Care Yvonnia Tango: [name] Other Clinician: Referring Nyelle Wolfson: Treating Izaiah Tabb/Extender: Rowland Lathe Weeks in Treatment: 2 Wound Status Wound Number: 1 Primary Diabetic Wound/Ulcer of the Lower Extremity Etiology: Wound Location: Left, Medial Foot Wound Open Wounding Event: Pressure Injury Status: Date Acquired: 09/10/2021 Comorbid Coronary Artery Disease, Hypertension, Myocardial Infarction, Weeks Of Treatment: 2 History: Peripheral Arterial Disease, Type II Diabetes Clustered Wound: No Photos Wound Measurements Length: (cm) 2.7 Width: (cm) 3 Depth: (cm) 0.1 Area: (cm) 6.362 Volume: (cm) 0.636 % Reduction in Area: -11.1% % Reduction in Volume: -11% Epithelialization: Small (1-33%) Tunneling: No Undermining: No Wound Description Classification: Unable to visualize wound bed Wound Margin: Distinct, outline attached Exudate Amount: Medium Exudate Type: Serosanguineous Exudate Color: red, brown Foul Odor After Cleansing: No Slough/Fibrino Yes Wound Bed Granulation Amount: Small (1-33%) Exposed Structure Granulation Quality: Red Fascia Exposed: No Necrotic Amount: Large (67-100%) Fat Layer (Subcutaneous Tissue) Exposed: Yes Necrotic Quality: Adherent Slough Tendon Exposed: No Muscle Exposed: No Joint Exposed: No Bone Exposed: No Treatment Notes Wound #1 (Foot) Wound Laterality: Left, Medial Cleanser Soap and Water Discharge Instruction: May shower and wash wound with dial antibacterial soap and water prior to dressing change. Wound Cleanser Discharge Instruction: Cleanse the wound with wound cleanser prior to applying a clean dressing using gauze sponges, not tissue or cotton balls. Peri-Wound Care Zinc Oxide Ointment 30g tube Discharge Instruction: Apply  Zinc Oxide to periwound with each dressing change Topical Primary Dressing Santyl Ointment Discharge Instruction: Apply nickel thick amount to wound bed as instructed Secondary Dressing Woven Gauze Sponge, Non-Sterile 4x4 in Discharge Instruction: Apply over primary dressing as directed. Secured With Conforming Stretch Gauze Bandage, Sterile 2x75 (in/in) Discharge Instruction: Secure with stretch gauze as directed. 67M Medipore Soft  Cloth Surgical T 2x10 (in/yd) ape Discharge Instruction: Secure with tape as directed. Compression Wrap Compression Stockings Add-Ons Electronic Signature(s) Signed: 11/20/2021 5:22:20 PM By: [name], [name] Signed: 11/21/2021 4:55:17 PM By: Rhae Hammock RN Entered By: Levan Hurst on 11/20/2021 15:55:57 -------------------------------------------------------------------------------- Vitals Details Patient Name: Date of Service: [address] [place] [name] 11/20/2021 3:00 PM Medical Record Number: [id number] Patient Account Number: [id number] Date of Birth/Sex: Treating RN: [birth year] (88 y.o. [name] Primary Care Brittnee Gaetano: [name] Other Clinician: Referring Sharmaine Bain: Treating Aviance [place]/Extender: Rowland Lathe Weeks in Treatment: 2 Vital Signs Time Taken: 15:52 Temperature (F): 98.0 Height (in): 71 Pulse (bpm): 69 Weight (lbs): 135 Respiratory Rate (breaths/min): 18 Body Mass Index (BMI): 18.8 Blood Pressure (mmHg): 149/75 Capillary Blood Glucose (mg/dl): 106 Reference Range: 80 - 120 mg / dl Notes glucose per pt report Electronic Signature(s) Signed: 11/20/2021 5:22:20 PM By: [name], [name] Entered By: Levan Hurst on 11/20/2021 15:53:57

## 2021-11-25 DIAGNOSIS — Z7984 Long term (current) use of oral hypoglycemic drugs: Secondary | ICD-10-CM | POA: Diagnosis not present

## 2021-11-25 DIAGNOSIS — L89892 Pressure ulcer of other site, stage 2: Secondary | ICD-10-CM | POA: Diagnosis not present

## 2021-11-25 DIAGNOSIS — K219 Gastro-esophageal reflux disease without esophagitis: Secondary | ICD-10-CM | POA: Diagnosis not present

## 2021-11-25 DIAGNOSIS — Z48812 Encounter for surgical aftercare following surgery on the circulatory system: Secondary | ICD-10-CM | POA: Diagnosis not present

## 2021-11-25 DIAGNOSIS — E119 Type 2 diabetes mellitus without complications: Secondary | ICD-10-CM | POA: Diagnosis not present

## 2021-11-25 DIAGNOSIS — I2510 Atherosclerotic heart disease of native coronary artery without angina pectoris: Secondary | ICD-10-CM | POA: Diagnosis not present

## 2021-11-25 DIAGNOSIS — E785 Hyperlipidemia, unspecified: Secondary | ICD-10-CM | POA: Diagnosis not present

## 2021-11-25 DIAGNOSIS — M1380 Other specified arthritis, unspecified site: Secondary | ICD-10-CM | POA: Diagnosis not present

## 2021-11-25 DIAGNOSIS — I739 Peripheral vascular disease, unspecified: Secondary | ICD-10-CM | POA: Diagnosis not present

## 2021-11-25 DIAGNOSIS — I10 Essential (primary) hypertension: Secondary | ICD-10-CM | POA: Diagnosis not present

## 2021-11-25 NOTE — Progress Notes (Signed)
[facility] [place]  ASSESSMENT / PLAN: Harold [name] is a 88 y.o. male with atherosclerosis of native arteries of left lower extremity status post left common femoral to below knee popliteal bypass with PTFE 10/24/21.  Recommend the following which can slow the progression of atherosclerosis and reduce the risk of major adverse cardiac / limb events:  Complete cessation from all tobacco products. Blood glucose control with goal A1c < 7%. Blood pressure control with goal blood pressure < 140/90 mmHg. Lipid reduction therapy with goal LDL-C <100 mg/dL (<70 if symptomatic from PAD).  Aspirin '81mg'$  PO QD.  Atorvastatin 40-'80mg'$  PO QD (or other "high intensity" statin therapy).  He has a fluid collection below the popliteal incision which seems most consistent with a seroma.  He has some mild post bypass swelling.  He has evidence of osteomyelitis in the left first distal metatarsal.  I counseled him that her amputation of the first toe would be the most definitive therapy for osteomyelitis.  He is reluctant to do this at this time.  I encouraged him to talk to his care team about this to help him make a decision.  In the meantime I agree with continued antibiotic therapy and wound care.  We will see him again in 1 month.  CHIEF COMPLAINT: left foot ulceration  HISTORY OF PRESENT ILLNESS: Harold [name] is a 88 y.o. male who presents to clinic for evaluation of left foot ulcer.  This is been present for many weeks.  He has been under the care of Dr. Cannon Kettle, has been doing meticulous wound care.  An ankle-brachial index was ordered, and suggestive of peripheral arterial disease.  He was referred for further evaluation.  The patient is a spry, and energetic 88 year old looking much younger than his stated age.  He is reportedly quite active per his daughter who is in the room with him.  His daughter is a Marine scientist in the neonatal unit at [employer].  11/26/21: Patient returns  for postoperative visit.  He underwent left common femoral to below-knee popliteal artery bypass on 10/24/2021.  He tolerated this well.  He returns to clinic for reevaluation.  Thankfully his left foot feels better.  The ulcer about his left foot appears to be healing.  Unfortunately recent x-ray showed some evidence of osteomyelitis about the first metatarsal head.  He is also developed some focal swelling about the popliteal incision.  Past Medical History:  Diagnosis Date   Arthritis    "generalized" (03/01/2013)   CAD (coronary artery disease)    Cataract    GERD (gastroesophageal reflux disease)    HOH (hard of hearing)    Hyperlipidemia    Myocardial infarction ([place]) 06/16/2002   PONV (postoperative nausea and vomiting)    Type II diabetes mellitus ([place])    Unspecified essential hypertension     Past Surgical History:  Procedure Laterality Date   ABDOMINAL AORTOGRAM W/LOWER EXTREMITY N/A 10/11/2021   Procedure: ABDOMINAL AORTOGRAM W/LOWER EXTREMITY;  Surgeon: [name], MD;  Location: [facility];  Service: Cardiovascular;  Laterality: N/A;  Left Lower Extremity   BACK SURGERY  1970s   CATARACT EXTRACTION W/ INTRAOCULAR LENS IMPLANT Left 2013   CHOLECYSTECTOMY N/A 03/02/2013   Procedure: LAPAROSCOPIC CHOLECYSTECTOMY;  Surgeon: Rolm Bookbinder, MD;  Location: [facility];  Service: General;  Laterality: N/A;   CORONARY ANGIOPLASTY WITH STENT PLACEMENT     "had 2-3; they clogged; he later had OHS" (03/01/2013)   CORONARY ARTERY BYPASS  GRAFT  02/2003   "triple" (03/01/2013)   FEMORAL-POPLITEAL BYPASS GRAFT Left 10/24/2021   Procedure: BYPASS LEFT COMMON FEMORAL-BELOW KNEE POPLITEAL ARTERY WITH 6MM BY 80CM RING GRAFT;  Surgeon: [name], MD;  Location: [facility];  Service: Vascular;  Laterality: Left;    Family History  Problem Relation Age of Onset   Heart disease Mother    Diabetes Mother     Social History   Socioeconomic History   Marital status: Single     Spouse name: Not on file   Number of children: Not on file   Years of education: Not on file   Highest education level: Not on file  Occupational History   Not on file  Tobacco Use   Smoking status: Former    Years: 30.00    Types: Cigarettes   Smokeless tobacco: Never  Vaping Use   Vaping Use: Never used  Substance and Sexual Activity   Alcohol use: Not Currently    Comment: 03/01/2013 "drank years ago; never had problem w/it"   Drug use: No   Sexual activity: Never  Other Topics Concern   Not on file  Social History Narrative   Not on file   Social Determinants of Health   Financial Resource Strain: Not on file  Food Insecurity: Not on file  Transportation Needs: Not on file  Physical Activity: Not on file  Stress: Not on file  Social Connections: Not on file  Intimate Partner Violence: Not on file    No Known Allergies  Current Outpatient Medications  Medication Sig Dispense Refill   ACCU-CHEK AVIVA PLUS test strip      Accu-Chek Softclix Lancets lancets SMARTSIG:Topical     amLODipine (NORVASC) 5 MG tablet Take 5 mg by mouth daily.     aspirin EC 81 MG tablet Take 81 mg by mouth daily.     cefadroxil (DURICEF) 500 MG capsule Take 2 capsules (1,000 mg total) by mouth 2 (two) times daily. 120 capsule 1   Cholecalciferol (VITAMIN D3) 50 MCG (2000 UT) TABS Take 2,000 Units by mouth daily.     dorzolamide-timolol (COSOPT) 22.3-6.8 MG/ML ophthalmic solution 1 drop 2 (two) times daily.     doxycycline (VIBRAMYCIN) 100 MG capsule Take 1 capsule (100 mg total) by mouth 2 (two) times daily. 60 capsule 1   gabapentin (NEURONTIN) 100 MG capsule Take 100 mg by mouth 3 (three) times daily.     latanoprost (XALATAN) 0.005 % ophthalmic solution Place 1 drop into both eyes at bedtime.     lidocaine (XYLOCAINE) 2 % jelly Apply 1 application. topically 3 (three) times daily as needed.     lidocaine (XYLOCAINE) 5 % ointment 1 application. daily as needed for mild pain or moderate  pain.     lisinopril-hydrochlorothiazide (ZESTORETIC) 20-25 MG tablet Take 1 tablet by mouth daily.     metFORMIN (GLUCOPHAGE) 500 MG tablet Take 500 mg by mouth 2 (two) times daily with a meal.     oxyCODONE-acetaminophen (PERCOCET/ROXICET) 5-325 MG tablet Take 1 tablet by mouth every 6 (six) hours as needed for moderate pain. 30 tablet 0   pantoprazole (PROTONIX) 40 MG tablet Take 40 mg by mouth daily.     SANTYL 250 UNIT/GM ointment Apply topically daily.     simvastatin (ZOCOR) 80 MG tablet Take 40 mg by mouth at bedtime.     No current facility-administered medications for this visit.    PHYSICAL EXAM Vitals:   11/26/21 1333  BP: 116/70  Pulse:  66  Resp: 20  Temp: 97.9 F (36.6 C)  SpO2: 93%  Weight: 138 lb (62.6 kg)  Height: '5\' 9"'$  (1.753 m)     Constitutional: well appearing elderly gentleman. no distress. Appears well nourished.  Cardiac: regular rate and rhythm.  Respiratory:  unlabored. Abdominal:  soft, non-tender, non-distended.  Peripheral vascular: Brisk Doppler flow in the foot.  Left foot ulcer appears slightly improved with a healing edge.  Ballotable fluid collection about the left popliteal incision which is not inflamed, indurated, tender, or draining.  PERTINENT LABORATORY AND RADIOLOGIC DATA  Most recent CBC    Latest Ref Rng & Units 10/25/2021    2:41 AM 10/24/2021    8:44 AM 10/11/2021    8:13 AM  CBC  WBC 4.0 - 10.5 K/uL 8.7  7.8    Hemoglobin 13.0 - 17.0 g/dL 10.9  10.5  12.9   Hematocrit 39.0 - 52.0 % 32.2  34.3  38.0   Platelets 150 - 400 K/uL 235  205       Most recent CMP    Latest Ref Rng & Units 11/15/2021    9:34 AM 10/25/2021    2:41 AM 10/24/2021    8:44 AM  CMP  Glucose 65 - 99 mg/dL 206  144  94   BUN 7 - 25 mg/dL '20  16  17   '$ Creatinine 0.70 - 1.22 mg/dL 1.05  0.98  0.83   Sodium 135 - 146 mmol/L 143  135  137   Potassium 3.5 - 5.3 mmol/L 4.1  3.9  4.0   Chloride 98 - 110 mmol/L 106  105  108   CO2 20 - 32 mmol/L '29  22  17    '$ Calcium 8.6 - 10.3 mg/dL 9.6  8.3  8.4   Total Protein 6.5 - 8.1 g/dL   5.2   Total Bilirubin 0.3 - 1.2 mg/dL   0.7   Alkaline Phos 38 - 126 U/L   32   AST 15 - 41 U/L   17   ALT 0 - 44 U/L   9    Vascular Imaging:   +-------+-----------+-----------+------------+------------+  ABI/TBIToday's ABIToday's TBIPrevious ABIPrevious TBI  +-------+-----------+-----------+------------+------------+  Right  [place]         0.56       [place]          0.53          +-------+-----------+-----------+------------+------------+  Left   [place]         0.64       [place]          0.30          +-------+-----------+-----------+------------+------------+   Yevonne Aline. Stanford Breed, MD [facility] Phone Number: [phone] 11/26/2021 4:53 PM  Portions of this report may have been transcribed using voice recognition software.  Every effort has been made to ensure accuracy; however, inadvertent computerized transcription errors may still be present.

## 2021-11-26 ENCOUNTER — Encounter (HOSPITAL_BASED_OUTPATIENT_CLINIC_OR_DEPARTMENT_OTHER): Payer: Medicare Other | Admitting: Internal Medicine

## 2021-11-26 ENCOUNTER — Ambulatory Visit (HOSPITAL_COMMUNITY)
Admission: RE | Admit: 2021-11-26 | Discharge: 2021-11-26 | Disposition: A | Discharge status: Home / Self Care | Payer: Medicare Other | Source: Ambulatory Visit | Attending: Vascular Surgery | Admitting: Vascular Surgery

## 2021-11-26 ENCOUNTER — Ambulatory Visit (INDEPENDENT_AMBULATORY_CARE_PROVIDER_SITE_OTHER): Payer: Medicare Other | Admitting: Vascular Surgery

## 2021-11-26 ENCOUNTER — Encounter: Payer: Self-pay | Admitting: Vascular Surgery

## 2021-11-26 VITALS — BP 116/70 | HR 66 | Temp 97.9°F | Resp 20 | Ht 69.0 in | Wt 138.0 lb

## 2021-11-26 DIAGNOSIS — I7025 Atherosclerosis of native arteries of other extremities with ulceration: Secondary | ICD-10-CM | POA: Diagnosis not present

## 2021-11-26 DIAGNOSIS — E11621 Type 2 diabetes mellitus with foot ulcer: Secondary | ICD-10-CM

## 2021-11-26 DIAGNOSIS — E1151 Type 2 diabetes mellitus with diabetic peripheral angiopathy without gangrene: Secondary | ICD-10-CM | POA: Diagnosis not present

## 2021-11-26 DIAGNOSIS — I739 Peripheral vascular disease, unspecified: Secondary | ICD-10-CM

## 2021-11-26 DIAGNOSIS — E1169 Type 2 diabetes mellitus with other specified complication: Secondary | ICD-10-CM | POA: Diagnosis not present

## 2021-11-26 DIAGNOSIS — Z87891 Personal history of nicotine dependence: Secondary | ICD-10-CM | POA: Diagnosis not present

## 2021-11-26 DIAGNOSIS — L97508 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 with other specified severity: Secondary | ICD-10-CM | POA: Diagnosis not present

## 2021-11-26 DIAGNOSIS — I70245 Atherosclerosis of native arteries of left leg with ulceration of other part of foot: Secondary | ICD-10-CM | POA: Diagnosis not present

## 2021-11-26 DIAGNOSIS — Z7984 Long term (current) use of oral hypoglycemic drugs: Secondary | ICD-10-CM | POA: Diagnosis not present

## 2021-11-26 DIAGNOSIS — M86672 Other chronic osteomyelitis, left ankle and foot: Secondary | ICD-10-CM | POA: Diagnosis not present

## 2021-11-26 NOTE — Progress Notes (Signed)
Harold [name], Harold [name] ([id number]) Visit Report for 11/26/2021 Arrival Information Details Patient Name: Date of Service: Harold [name] [name] 11/26/2021 11:15 A M Medical Record Number: [id number] Patient Account Number: [id number] Date of Birth/Sex: Treating RN: May 28, 1932 (88 y.o. Harold [name] Primary Care Harold [name]: Harold [name] Other Clinician: Referring Harold [name]: Treating Harold [name]/Extender: Harold [name] in Treatment: 3 Visit Information History Since Last Visit Added or deleted any medications: No Patient Arrived: Harold [name] Any new allergies or adverse reactions: No Arrival Time: 11:08 Had a fall or experienced change in No Accompanied By: Daughter activities of daily living that may affect Transfer Assistance: None risk of falls: Patient Identification Verified: Yes Signs or symptoms of abuse/neglect since last visito No Secondary Verification Process Completed: Yes Hospitalized since last visit: No Patient Requires Transmission-Based Precautions: No Implantable device outside of the clinic excluding No Patient Has Alerts: Yes cellular tissue based products placed in the center Patient Alerts: R ABI Harold [name] R TBI: 0.53 since last visit: L ABI Harold [name] L TBI: 0.30 Has Dressing in Place as Prescribed: Yes Pain Present Now: No Electronic Signature(s) Signed: 11/26/2021 5:10:04 PM By: Harold [name] Entered By: Harold [name] on 11/26/2021 11:10:16 -------------------------------------------------------------------------------- Encounter Discharge Information Details Patient Name: Date of Service: [address], Harold [name] Harold E. 11/26/2021 11:15 A M Medical Record Number: [id number] Patient Account Number: [id number] Date of Birth/Sex: Treating RN: November 24, 1931 (88 y.o. Harold [name] Primary Care Omid Deardorff: Harold [name] Other Clinician: Referring Harold [name]: Treating Harold [name]/Extender: Harold [name] in  Treatment: 3 Encounter Discharge Information Items Post Procedure Vitals Discharge Condition: Stable Temperature (F): 98.2 Ambulatory Status: Walker Pulse (bpm): 73 Discharge Destination: Home Respiratory Rate (breaths/min): 18 Transportation: Private Auto Blood Pressure (mmHg): 136/72 Accompanied By: Daughter Schedule Follow-up Appointment: Yes Clinical Summary of Care: Provided on 11/26/2021 Form Type Recipient Paper Patient Patient Electronic Signature(s) Signed: 11/26/2021 12:18:42 PM By: Harold [name] Entered By: Harold [name] on 11/26/2021 12:18:42 -------------------------------------------------------------------------------- Lower Extremity Assessment Details Patient Name: Date of Service: Harold [name] Harold E. 11/26/2021 11:15 A M Medical Record Number: [id number] Patient Account Number: [id number] Date of Birth/Sex: Treating RN: 1932-05-10 (88 y.o. Harold [name] Primary Care Gianny Killman: Harold [name] Other Clinician: Referring Anne Sebring: Treating Harold [name]/Extender: Harold [name] in Treatment: 3 Edema Assessment Assessed: [Left: Yes] [Right: No] Edema: [Left: Ye] [Right: s] Calf Left: Right: Point of Measurement: 31 cm From Medial Instep 37 cm Ankle Left: Right: Point of Measurement: 10 cm From Medial Instep 24.8 cm Knee To Floor Left: Right: From Medial Instep 45 cm Vascular Assessment Pulses: Dorsalis Pedis Palpable: [Left:Yes] Electronic Signature(s) Signed: 11/26/2021 5:10:04 PM By: Harold [name] Entered By: Harold [name] on 11/26/2021 11:14:52 -------------------------------------------------------------------------------- Multi Wound Chart Details Patient Name: Date of Service: Harold [name] Harold E. 11/26/2021 11:15 A M Medical Record Number: [id number] Patient Account Number: [id number] Date of Birth/Sex: Treating RN: [birth year] (88 y.o. Harold [name] Primary Care Lynette Topete: Harold [name] Other Clinician: Referring Harold [name]: Treating Harold [name]/Extender: Harold [name] in Treatment: 3 Vital Signs Height(in): 71 Pulse(bpm): 73 Weight(lbs): 135 Blood Pressure(mmHg): 136/72 Body Mass Index(BMI): 18.8 Temperature(F): 98.2 Respiratory Rate(breaths/min): 18 Photos: [1:Left, Medial Foot] [[name]/A:[name]/A [name]/A] Wound Location: [1:Pressure Injury] [[name]/A:[name]/A] Wounding Event: [1:Diabetic Wound/Ulcer of the Lower] [[name]/A:[name]/A] Primary Etiology: [1:Extremity Coronary Artery Disease,] [[name]/A:[name]/A] Comorbid History: [1:Hypertension, Myocardial Infarction, Peripheral Arterial Disease, Type II Diabetes 09/10/2021] [[name]/A:[name]/A] Date Acquired: [1:3] [[name]/A:[name]/A] [name] of Treatment: [1:Open] [[name]/A:[name]/A] Wound Status: [1:No] [  [name]/A:[name]/A] Wound Recurrence: [1:3x3.2x0.1] [[name]/A:[name]/A] Measurements L x W x D (cm) [1:7.54] [[name]/A:[name]/A] A (cm) : rea [1:0.754] [[name]/A:[name]/A] Volume (cm) : [1:-31.70%] [[name]/A:[name]/A] % Reduction in A [1:rea: -31.60%] [[name]/A:[name]/A] % Reduction in Volume: [1:Unable to visualize wound bed] [[name]/A:[name]/A] Classification: [1:Medium] [[name]/A:[name]/A] Exudate A mount: [1:Purulent] [[name]/A:[name]/A] Exudate Type: [1:yellow, brown, green] [[name]/A:[name]/A] Exudate Color: [1:Distinct, outline attached] [[name]/A:[name]/A] Wound Margin: [1:Small (1-33%)] [[name]/A:[name]/A] Granulation A mount: [1:Red] [[name]/A:[name]/A] Granulation Quality: [1:Large (67-100%)] [[name]/A:[name]/A] Necrotic A mount: [1:Fat Layer (Subcutaneous Tissue): Yes [name]/A] Exposed Structures: [1:Fascia: No Tendon: No Muscle: No Joint: No Bone: No Small (1-33%)] [[name]/A:[name]/A] Epithelialization: [1:Debridement - Selective/Open Wound [name]/A] Debridement: Pre-procedure Verification/Time Out 11:48 [[name]/A:[name]/A] Taken: [1:Other] [[name]/A:[name]/A] Pain Control: [1:Slough] [[name]/A:[name]/A] Tissue Debrided: [1:Non-Viable Tissue] [[name]/A:[name]/A] Level: [1:9.6] [[name]/A:[name]/A] Debridement A (sq cm): [1:rea Curette] [[name]/A:[name]/A] Instrument: [1:Minimum] [[name]/A:[name]/A] Bleeding: [1:Pressure] [[name]/A:[name]/A] Hemostasis A chieved:  [1:Procedure was tolerated well] [[name]/A:[name]/A] Debridement Treatment Response: [1:3x3.2x0.1] [[name]/A:[name]/A] Post Debridement Measurements L x W x D (cm) [1:0.754] [[name]/A:[name]/A] Post Debridement Volume: (cm) [1:Debridement] [[name]/A:[name]/A] Treatment Notes Wound #1 (Foot) Wound Laterality: Left, Medial Cleanser Soap and Water Discharge Instruction: May shower and wash wound with dial antibacterial soap and water prior to dressing change. Wound Cleanser Discharge Instruction: Cleanse the wound with wound cleanser prior to applying a clean dressing using gauze sponges, not tissue or cotton balls. Peri-Wound Care Zinc Oxide Ointment 30g tube Discharge Instruction: Apply Zinc Oxide to periwound with each dressing change Topical Primary Dressing Santyl Ointment Discharge Instruction: Apply nickel thick amount to wound bed as instructed Secondary Dressing Woven Gauze Sponge, Non-Sterile 4x4 in Discharge Instruction: Apply over primary dressing as directed. Secured With Conforming Stretch Gauze Bandage, Sterile 2x75 (in/in) Discharge Instruction: Secure with stretch gauze as directed. 56M Medipore Soft Cloth Surgical T 2x10 (in/Harold) ape Discharge Instruction: Secure with tape as directed. Compression Wrap Compression Stockings Add-Ons Electronic Signature(s) Signed: 11/26/2021 2:16:05 PM By: Kalman Shan DO Signed: 11/26/2021 5:07:46 PM By: Deon Pilling RN, [name] Entered By: Kalman Shan on 11/26/2021 13:24:55 -------------------------------------------------------------------------------- Multi-Disciplinary Care Plan Details Patient Name: Date of Service: Harold [name], [place] Harold E. 11/26/2021 11:15 A M Medical Record Number: [id number] Patient Account Number: [id number] Date of Birth/Sex: Treating RN: 08-07-1931 (88 y.o. Harold [name] Primary Care Ryliee Figge: Harold [name] Other Clinician: Referring Farhad Burleson: Treating Vauda Salvucci/Extender: Adline Mango in  Treatment: 3 Multidisciplinary Care Plan reviewed with physician Active Inactive Abuse / Safety / Falls / Self Care Management Nursing Diagnoses: Potential for falls Potential for injury related to falls Goals: Patient will not experience any injury related to falls Date Initiated: 11/04/2021 Target Resolution Date: 12/06/2021 Goal Status: Active Patient/caregiver will verbalize/demonstrate measures taken to prevent injury and/or falls Date Initiated: 11/04/2021 Target Resolution Date: 12/06/2021 Goal Status: Active Interventions: Assess Activities of Daily Living upon admission and as needed Assess fall risk on admission and as needed Assess: immobility, friction, shearing, incontinence upon admission and as needed Assess impairment of mobility on admission and as needed per policy Assess personal safety and home safety (as indicated) on admission and as needed Provide education on fall prevention Provide education on personal and home safety Notes: Nutrition Nursing Diagnoses: Impaired glucose control: actual or potential Goals: Patient/caregiver will maintain therapeutic glucose control Date Initiated: 11/04/2021 Target Resolution Date: 12/06/2021 Goal Status: Active Interventions: Assess HgA1c results as ordered upon admission and as needed Assess patient nutrition upon admission and as needed per policy Provide education on elevated blood sugars and impact on wound healing Notes: Osteomyelitis Nursing Diagnoses: Infection: osteomyelitis Goals:  Diagnostic evaluation for osteomyelitis completed as ordered Date Initiated: 11/14/2021 Target Resolution Date: 11/14/2021 Goal Status: Active Interventions: Assess for signs and symptoms of osteomyelitis resolution every visit Provide education on osteomyelitis Screen for HBO Treatment Activities: Surgical debridement : 11/14/2021 Systemic antibiotics : 11/14/2021 X-ray : 11/04/2021 Notes: Wound/Skin Impairment Nursing  Diagnoses: Impaired tissue integrity Knowledge deficit related to ulceration/compromised skin integrity Goals: Patient/caregiver will verbalize understanding of skin care regimen Date Initiated: 11/04/2021 Target Resolution Date: 12/06/2021 Goal Status: Active Ulcer/skin breakdown will have a volume reduction of 30% by week 4 Date Initiated: 11/04/2021 Target Resolution Date: 12/06/2021 Goal Status: Active Interventions: Assess patient/caregiver ability to obtain necessary supplies Assess patient/caregiver ability to perform ulcer/skin care regimen upon admission and as needed Assess ulceration(s) every visit Provide education on ulcer and skin care Notes: Electronic Signature(s) Signed: 11/26/2021 5:10:04 PM By: Harold [name] Entered By: Harold [name] on 11/26/2021 11:19:20 -------------------------------------------------------------------------------- Pain Assessment Details Patient Name: Date of Service: Harold [name] Harold E. 11/26/2021 11:15 A M Medical Record Number: [id number] Patient Account Number: [id number] Date of Birth/Sex: Treating RN: 1931/10/02 (88 y.o. Harold [name] Primary Care Nakyia Dau: Harold [name] Other Clinician: Referring Jenelle Drennon: Treating Jarman Litton/Extender: Harold [name] in Treatment: 3 Active Problems Location of Pain Severity and Description of Pain Patient Has Paino No Site Locations Pain Management and Medication Current Pain Management: Electronic Signature(s) Signed: 11/26/2021 5:10:04 PM By: Harold [name] Entered By: Harold [name] on 11/26/2021 11:13:29 -------------------------------------------------------------------------------- Patient/Caregiver Education Details Patient Name: Date of Service: Harold [name], Harold Harold E. 6/13/2023andnbsp11:15 A M Medical Record Number: [id number] Patient Account Number: [id number] Date of Birth/Gender: Treating RN: August 14, 1931 (88 y.o. Harold [name] Primary Care Physician: Harold [name] Other Clinician: Referring Physician: Treating Physician/Extender: Adline Mango in Treatment: 3 Education Assessment Education Provided To: Patient Education Topics Provided Elevated Blood Sugar/ Impact on Healing: Methods: Explain/Verbal, Printed Responses: State content correctly Wound/Skin Impairment: Methods: Demonstration, Explain/Verbal, Printed Responses: State content correctly Electronic Signature(s) Signed: 11/26/2021 5:10:04 PM By: Harold [name] Entered By: Harold [name] on 11/26/2021 11:19:39 -------------------------------------------------------------------------------- Wound Assessment Details Patient Name: Date of Service: Harold [name] Harold E. 11/26/2021 11:15 A M Medical Record Number: [id number] Patient Account Number: [id number] Date of Birth/Sex: Treating RN: 1932-03-04 (88 y.o. Harold [name] Primary Care Taite Baldassari: Harold [name] Other Clinician: Referring Johnwesley Lederman: Treating Zae Kirtz/Extender: Harold [name] in Treatment: 3 Wound Status Wound Number: 1 Primary Diabetic Wound/Ulcer of the Lower Extremity Etiology: Wound Location: Left, Medial Foot Wound Open Wounding Event: Pressure Injury Status: Date Acquired: 09/10/2021 Comorbid Coronary Artery Disease, Hypertension, Myocardial Infarction, [name] Of Treatment: 3 History: Peripheral Arterial Disease, Type II Diabetes Clustered Wound: No Photos Wound Measurements Length: (cm) 3 Width: (cm) 3.2 Depth: (cm) 0.1 Area: (cm) 7.54 Volume: (cm) 0.754 % Reduction in Area: -31.7% % Reduction in Volume: -31.6% Epithelialization: Small (1-33%) Tunneling: No Undermining: No Wound Description Classification: Unable to visualize wound bed Wound Margin: Distinct, outline attached Exudate Amount: Medium Exudate Type: Purulent Exudate Color: yellow, brown, green Foul Odor After  Cleansing: No Slough/Fibrino Yes Wound Bed Granulation Amount: Small (1-33%) Exposed Structure Granulation Quality: Red Fascia Exposed: No Necrotic Amount: Large (67-100%) Fat Layer (Subcutaneous Tissue) Exposed: Yes Necrotic Quality: Adherent Slough Tendon Exposed: No Muscle Exposed: No Joint Exposed: No Bone Exposed: No Treatment Notes Wound #1 (Foot) Wound Laterality: Left, Medial Cleanser Soap and Water Discharge Instruction: May shower and wash wound with dial antibacterial soap and water  prior to dressing change. Wound Cleanser Discharge Instruction: Cleanse the wound with wound cleanser prior to applying a clean dressing using gauze sponges, not tissue or cotton balls. Peri-Wound Care Zinc Oxide Ointment 30g tube Discharge Instruction: Apply Zinc Oxide to periwound with each dressing change Topical Primary Dressing Santyl Ointment Discharge Instruction: Apply nickel thick amount to wound bed as instructed Secondary Dressing Woven Gauze Sponge, Non-Sterile 4x4 in Discharge Instruction: Apply over primary dressing as directed. Secured With Conforming Stretch Gauze Bandage, Sterile 2x75 (in/in) Discharge Instruction: Secure with stretch gauze as directed. 44M Medipore Soft Cloth Surgical T 2x10 (in/Harold) ape Discharge Instruction: Secure with tape as directed. Compression Wrap Compression Stockings Add-Ons Electronic Signature(s) Signed: 11/26/2021 5:10:04 PM By: Harold [name] Entered By: Harold [name] on 11/26/2021 11:17:20 -------------------------------------------------------------------------------- Vitals Details Patient Name: Date of Service: [id number], [place] Harold E. 11/26/2021 11:15 A M Medical Record Number: [id number] Patient Account Number: [id number] Date of Birth/Sex: Treating RN: 15-Jul-1931 (88 y.o. Harold [name] Primary Care Chez Bulnes: Harold [name] Other Clinician: Referring Terika Pillard: Treating Nevaen Tredway/Extender: Harold [name] in Treatment: 3 Vital Signs Time Taken: 11:10 Temperature (F): 98.2 Height (in): 71 Pulse (bpm): 73 Weight (lbs): 135 Respiratory Rate (breaths/min): 18 Body Mass Index (BMI): 18.8 Blood Pressure (mmHg): 136/72 Reference Range: 80 - 120 mg / dl Electronic Signature(s) Signed: 11/26/2021 5:10:04 PM By: Harold [name] Entered By: Harold [name] on 11/26/2021 11:13:09

## 2021-11-26 NOTE — Progress Notes (Signed)
TICO, CROTTEAU ([id number]) Visit Report for 11/26/2021 Chief Complaint Document Details Patient Name: Date of Service: Harold [name] [name] 11/26/2021 11:15 A M Medical Record Number: [id number] Patient Account Number: [id number] Date of Birth/Sex: Treating RN: 1932/05/05 (88 y.o. [name] Primary Care Provider: [name] Other Clinician: Referring Provider: Treating Provider/Extender: Darl Pikes Weeks in Treatment: 3 Information Obtained from: Patient Chief Complaint 11/04/2021; left foot wound Electronic Signature(s) Signed: 11/26/2021 2:16:05 PM By: Kalman Shan DO Entered By: Kalman Shan on 11/26/2021 13:25:05 -------------------------------------------------------------------------------- Debridement Details Patient Name: Date of Service: Harold [name] [name] 11/26/2021 11:15 A M Medical Record Number: [id number] Patient Account Number: [id number] Date of Birth/Sex: Treating RN: [birth year] (88 y.o. [name] Primary Care Provider: [name] Other Clinician: Referring Provider: Treating Provider/Extender: Darl Pikes Weeks in Treatment: 3 Debridement Performed for Assessment: Wound #1 Left,Medial Foot Performed By: Physician Kalman Shan, DO Debridement Type: Debridement Severity of Tissue Pre Debridement: Fat layer exposed Level of Consciousness (Pre-procedure): Awake and Alert Pre-procedure Verification/Time Out Yes - 11:48 Taken: Start Time: 11:49 Pain Control: Other : Benzocaine T Area Debrided (L x W): otal 3 (cm) x 3.2 (cm) = 9.6 (cm) Tissue and other material debrided: Non-Viable, Slough, Slough Level: Non-Viable Tissue Debridement Description: Selective/Open Wound Instrument: Curette Bleeding: Minimum Hemostasis Achieved: Pressure End Time: 11:52 Response to Treatment: Procedure was tolerated well Level of Consciousness (Post- Awake and  Alert procedure): Post Debridement Measurements of Total Wound Length: (cm) 3 Width: (cm) 3.2 Depth: (cm) 0.1 Volume: (cm) 0.754 Character of Wound/Ulcer Post Debridement: Stable Severity of Tissue Post Debridement: Fat layer exposed Post Procedure Diagnosis Same as Pre-procedure Electronic Signature(s) Signed: 11/26/2021 2:16:05 PM By: Kalman Shan DO Signed: 11/26/2021 5:10:04 PM By: Lorrin Jackson Entered By: Lorrin Jackson on 11/26/2021 11:55:19 -------------------------------------------------------------------------------- HPI Details Patient Name: Date of Service: [address], [name] 11/26/2021 11:15 A M Medical Record Number: [id number] Patient Account Number: [id number] Date of Birth/Sex: Treating RN: 24-Aug-1931 (88 y.o. [name] Primary Care Provider: [name] Other Clinician: Referring Provider: Treating Provider/Extender: Darl Pikes Weeks in Treatment: 3 History of Present Illness HPI Description: Admission 11/04/2021 Mr. Natale Thoma is an 88 year old male with a past medical history of peripheral arterial disease, type 2 diabetes controlled on oral agents, former tobacco user that presents to the clinic for a medial left foot wound. He states that the wound started in March 2023. He noticed his foot was rubbing against in inside of his shoe creating a wound. He currently wears soft slippers to address this issue. He has been following with his podiatrist Dr. Cannon Kettle for this issue. On 09/19/2021 he had ABIs that were noncompressible with a TBI of 0.3 on the left. He subsequently had an abdominal aortogram on 4/28 that led to a femoropopliteal on 10/24/2021 by Dr. Stanford Breed. He has follow-up with vein and vascular on 11/26/2021. Patient reports chronic pain to the wound site. He has tried Iodoflex, Medihoney and antibiotic ointment to the wound bed with little benefit. 6/1; patient presents for follow-up. He obtained his  x-ray that showed new cortical erosion within the medial aspect of the great toe metatarsal head concerning for acute osteomyelitis. He has been using Santyl to the wound bed. He continues to report chronic pain to the wound site. He currently denies signs of infection. 6/7; he continues with Santyl as the primary dressing which his daughter is changing there  has been some improvement in the surface condition of the wound. He tolerates debridement very poorly. He is tolerating the antibiotics prescribed by infectious disease which is doxycycline and cefadroxil. He has follow-up arterial studies next week status post left Pham pop in April 6/13; patient presents for follow-up. He continues to use Santyl to the wound bed. He is still taking doxycycline and cefadroxil without issues. He has developed what appears to be a hematoma to the left upper leg. This is located to the area from where he had his femoropopliteal 1 month ago. He is not on blood thinner and denies signs of infection. He is scheduled to see vein and vascular today for this issue. Electronic Signature(s) Signed: 11/26/2021 2:16:05 PM By: Kalman Shan DO Entered By: Kalman Shan on 11/26/2021 13:28:58 -------------------------------------------------------------------------------- Physical Exam Details Patient Name: Date of Service: [address] [name] 11/26/2021 11:15 A M Medical Record Number: [id number] Patient Account Number: [id number] Date of Birth/Sex: Treating RN: 12/04/1931 (88 y.o. [name] Primary Care Provider: [name] Other Clinician: Referring Provider: Treating Provider/Extender: Darl Pikes Weeks in Treatment: 3 Constitutional respirations regular, non-labored and within target range for patient.. Cardiovascular 2+ dorsalis pedis/posterior tibialis pulses. Psychiatric pleasant and cooperative. Notes Left foot: T the medial aspect over the first met  head there is an open wound with scant granulation tissue and mostly nonviable tissue present. This is tightly o adhered. Electronic Signature(s) Signed: 11/26/2021 2:16:05 PM By: Kalman Shan DO Entered By: Kalman Shan on 11/26/2021 13:29:46 -------------------------------------------------------------------------------- Physician Orders Details Patient Name: Date of Service: [address] [place] [name] 11/26/2021 11:15 A M Medical Record Number: [id number] Patient Account Number: [id number] Date of Birth/Sex: Treating RN: 04-01-1932 (88 y.o. 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 - Dr. Heber Bonaparte and Leveda Anna, RN (room 7) Tuesday 12:30pm 12/03/2021 Return A Bathing/ Shower/ Hygiene May shower and wash wound with soap and water. - prior to dressing change Edema Control - Lymphedema / SCD / Other Elevate legs to the level of the heart or above for 30 minutes daily and/or when sitting, a frequency of: - throughout the day Avoid standing for long periods of time. Moisturize legs daily. Off-Loading Other: - Avoid any direct pressure to left foot Home Health No change in wound care orders this week; continue Home Health for wound care. May utilize formulary equivalent dressing for wound treatment orders unless otherwise specified. - Apply Santyl to wound on left foot daily (home health to change 2-3x a week, daughter will change rest of the week) Other Home Health Orders/Instructions:  - [facility] Therapy Evaluate for HBO Therapy Indication: - Diabetic foot wound with Osteomyelitis-Wagner Grade 3 2.0 ATA for 90 Minutes with 2 Five (5) Minute A Breaks ir Total Number of Treatments: - 40 One treatments per day (delivered Monday through Friday unless otherwise specified in Special Instructions below): Finger stick Blood Glucose Pre- and Post- HBOT Treatment. Follow Hyperbaric Oxygen Glycemia Protocol Afrin (Oxymetazoline HCL) 0.05% nasal spray - 1 spray in both nostrils daily as needed prior to HBO treatment  for difficulty clearing ears Wound Treatment Wound #1 - Foot Wound Laterality: Left, Medial Cleanser: Soap and Water 1 x Per Day/15 Days Discharge Instructions: May shower and wash wound with dial antibacterial soap and water prior to dressing change. Cleanser: Wound Cleanser 1 x Per Day/15 Days Discharge Instructions: Cleanse the wound with wound cleanser prior to applying a clean dressing using gauze sponges, not tissue or cotton balls. Peri-Wound Care: Zinc Oxide Ointment 30g tube 1 x Per Day/15 Days Discharge Instructions: Apply Zinc Oxide to periwound with each dressing change Prim Dressing: Santyl Ointment 1 x Per Day/15 Days ary Discharge Instructions: Apply nickel thick amount to wound bed as instructed Secondary Dressing: Woven Gauze Sponge, Non-Sterile 4x4 in 1 x Per Day/15 Days Discharge Instructions: Apply over primary dressing as directed. Secured With: Child psychotherapist, Sterile 2x75 (in/in) 1 x Per Day/15 Days Discharge Instructions: Secure with stretch gauze as directed. Secured With: 54M Medipore Public affairs consultant Surgical T 2x10 (in/yd) 1 x Per Day/15 Days ape Discharge Instructions: Secure with tape as directed. Radiology X-ray, Chest - Chest Xray for HBO - (ICD10 E11.621 - Type 2 diabetes mellitus with foot ulcer) Custom Services EKG - EKG : Pre-Hyperbaric Treatments - (ICD10 L97.508 - Non-pressure chronic ulcer of other  part of unspecified foot with other specified severity) GLYCEMIA INTERVENTIONS PROTOCOL PRE-HBO GLYCEMIA INTERVENTIONS ACTION INTERVENTION Obtain pre-HBO capillary blood glucose (ensure 1 physician order is in chart). A. Notify HBO physician and await physician orders. 2 If result is 70 mg/dl or below: B. If the result meets the hospital definition of a critical result, follow hospital policy. A. Give patient an 8 ounce Glucerna Shake, an 8 ounce Ensure, or 8 ounces of a Glucerna/Ensure equivalent dietary supplement*. B. Wait 30 minutes. If result is 71 mg/dl to 130 mg/dl: C. Retest patients capillary blood glucose (CBG). D. If result greater than or equal to 110 mg/dl, proceed with HBO. If result less than 110 mg/dl, notify HBO physician and consider holding HBO. If result is 131 mg/dl to 249 mg/dl: A. Proceed with HBO. A. Notify HBO physician and await physician orders. B. It is recommended to hold HBO and do If result is 250 mg/dl or greater: blood/urine ketone testing. C. If the result meets the hospital definition of a critical result, follow hospital policy. POST-HBO GLYCEMIA INTERVENTIONS ACTION INTERVENTION Obtain post HBO capillary blood glucose (ensure 1 physician order is in chart). A. Notify HBO physician and await physician orders. 2 If result is 70 mg/dl or below: B. If the result meets the hospital definition of a critical result, follow hospital policy. A. Give patient an 8 ounce Glucerna Shake, an 8 ounce Ensure, or 8 ounces of a Glucerna/Ensure equivalent dietary supplement*. B. Wait 15 minutes for symptoms of If result is 71 mg/dl to 100 mg/dl: hypoglycemia (i.e. nervousness, anxiety, sweating, chills, clamminess, irritability, confusion, tachycardia or dizziness). C. If patient asymptomatic, discharge patient. If patient symptomatic, repeat capillary blood glucose (CBG) and notify HBO physician. If result is 101 mg/dl to 249 mg/dl: A.  Discharge patient. A. Notify HBO physician and await physician orders. B. It is recommended to do blood/urine ketone If result is 250 mg/dl or greater: testing. C. If the result meets the hospital definition of a critical result, follow hospital policy. *Juice or candies are NOT equivalent products. If patient refuses the Glucerna or Ensure, please consult the hospital dietitian for an appropriate substitute. Electronic Signature(s) Signed: 11/26/2021 5:10:04 PM By: Lorrin Jackson Signed: 11/26/2021 5:27:49 PM By: Heber [place],  Tyteanna Ost DO Previous Signature: 11/26/2021 2:16:05 PM Version By: Kalman Shan DO Entered By: Lorrin Jackson on 11/26/2021 15:21:26 Prescription 11/26/2021 -------------------------------------------------------------------------------- Serita Grit E. Kalman Shan DO Patient Name: Provider: [birth year] [id number] Date of Birth: NPI#: Jerilynn Mages [id number] Sex: DEA #: [phone] Phone #: License #: [place] Patient Address: [place] [address] , [place], [place] [address] [phone] Allergies No Known Allergies Provider's Orders X-ray, Chest - ICD10: E11.621 - Chest Xray for HBO Hand Signature: Date(s): Prescription 11/26/2021 Oswaldo Milian, [place]. Kalman Shan DO Patient Name: Provider: 13-Mar-1932 [id number] Date of Birth: NPI#: Jerilynn Mages [id number] Sex: DEA #: [phone] Phone #: License #: [place] Patient Address: [facility] [address] [place] [address] , [place], [place] [address] [phone] Allergies No Known Allergies Provider's Orders EKG - ICD10: L97.508 - EKG : Pre-Hyperbaric Treatments Hand Signature: Date(s): Electronic Signature(s) Signed: 11/26/2021 5:10:04 PM By: Lorrin Jackson Signed: 11/26/2021 5:27:49 PM By: Kalman Shan  DO Previous Signature: 11/26/2021 2:16:05 PM Version By: Kalman Shan DO Entered By: Lorrin Jackson on 11/26/2021 15:21:27 -------------------------------------------------------------------------------- Problem List Details Patient Name: Date of Service: [name], [place] [name] 11/26/2021 11:15 A M Medical Record Number: [id number] Patient Account Number: [id number] Date of Birth/Sex: Treating RN: June 24, 1931 (88 y.o. 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 [name] 11/26/2021 11:15 A M Medical Record Number: [id number] Patient Account Number: [id number] Date of Birth/Sex: Treating RN: [birth year] (88 y.o. [name] Primary Care Provider: [name] Other Clinician: Referring Provider: Treating Provider/Extender: Darl Pikes Weeks in Treatment: 3 Subjective Chief Complaint Information obtained from Patient 11/04/2021; left foot wound History of Present Illness (HPI) Admission 11/04/2021 [name] is an 88 year old male with a past medical history of peripheral arterial disease, type 2 diabetes controlled  on oral agents, former tobacco user that presents to the clinic for a medial left foot wound. He states that the wound started in March 2023. He noticed his foot was rubbing against in inside of his shoe creating a wound. He currently wears soft slippers to address this issue. He has been following with his podiatrist Dr. Cannon Kettle for this issue. On 09/19/2021 he had ABIs that were noncompressible with a TBI of 0.3 on the left. He subsequently had an abdominal aortogram on 4/28 that led to a femoropopliteal on 10/24/2021 by Dr. Stanford Breed. He has follow-up with vein and vascular on 11/26/2021. Patient reports chronic pain to the wound site. He has tried Iodoflex, Medihoney and antibiotic ointment to the wound bed with little benefit. 6/1; patient presents for follow-up. He obtained his x-ray that showed new cortical erosion within the medial aspect of the great toe metatarsal head concerning for acute osteomyelitis. He has  been using Santyl to the wound bed. He continues to report chronic pain to the wound site. He currently denies signs of infection. 6/7; he continues with Santyl as the primary dressing which his daughter is changing there has been some improvement in the surface condition of the wound. He tolerates debridement very poorly. He is tolerating the antibiotics prescribed by infectious disease which is doxycycline and cefadroxil. He has follow-up arterial studies next week status post left Pham pop in April 6/13; patient presents for follow-up. He continues to use Santyl to the wound bed. He is still taking doxycycline and cefadroxil without issues. He has developed what appears to be a hematoma to the left upper leg. This is located to the area from where he had his femoropopliteal 1 month ago. He is not on blood thinner and denies signs of infection. He is scheduled to see vein and vascular today for this issue. Patient History Information obtained from Patient, Chart. Family History Unknown  History. Social History Former smoker, Alcohol Use - Never, Drug Use - No History, Caffeine Use - Rarely. Medical History Cardiovascular Patient has history of Coronary Artery Disease, Hypertension, Myocardial Infarction, Peripheral Arterial Disease Endocrine Patient has history of Type II Diabetes Medical A Surgical History Notes nd Gastrointestinal GERD Objective Constitutional respirations regular, non-labored and within target range for patient.. Vitals Time Taken: 11:10 AM, Height: 71 in, Weight: 135 lbs, BMI: 18.8, Temperature: 98.2 F, Pulse: 73 bpm, Respiratory Rate: 18 breaths/min, Blood Pressure: 136/72 mmHg. Cardiovascular 2+ dorsalis pedis/posterior tibialis pulses. Psychiatric pleasant and cooperative. General Notes: Left foot: T the medial aspect over the first met head there is an open wound with scant granulation tissue and mostly nonviable tissue o present. This is tightly adhered. Integumentary (Hair, Skin) Wound #1 status is Open. Original cause of wound was Pressure Injury. The date acquired was: 09/10/2021. The wound has been in treatment 3 weeks. The wound is located on the Left,Medial Foot. The wound measures 3cm length x 3.2cm width x 0.1cm depth; 7.54cm^2 area and 0.754cm^3 volume. There is Fat Layer (Subcutaneous Tissue) exposed. There is no tunneling or undermining noted. There is a medium amount of purulent drainage noted. The wound margin is distinct with the outline attached to the wound base. There is small (1-33%) red granulation within the wound bed. There is a large (67-100%) amount of necrotic tissue within the wound bed including Adherent Slough. 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 He was started on oral antibiotics by infectious disease for acute osteomyelitis with doxycycline and cefadroxil on 6/2. He is a well-controlled type II diabetic on oral agents. We discussed potentially doing HBO therapy for a wagner III and patient would like to proceed with this. We will go ahead and order a chest x-ray and EKG today. Patient's wound is stable. I debrided slough that easily came off. I recommended continuing Santyl. 50 minutes was spent on the encounter including face-to-face, EMR review and coordination of care Procedures Wound #1 Pre-procedure diagnosis of Wound #1 is a Diabetic Wound/Ulcer of the Lower Extremity located on the Left,Medial Foot .Severity of Tissue Pre Debridement is: Fat layer exposed. There  was a Selective/Open Wound Non-Viable Tissue Debridement with a total area of 9.6 sq cm performed by Kalman Shan, DO. With the following instrument(s): Curette to remove Non-Viable tissue/material. Material removed includes [facility] after achieving pain control using Other (Benzocaine). No specimens were taken. A time out was conducted at 11:48, prior to the start of the procedure. A Minimum amount of bleeding was controlled with Pressure. The procedure was tolerated well. Post Debridement Measurements: 3cm length x 3.2cm width x 0.1cm depth; 0.754cm^3 volume. Character of Wound/Ulcer Post Debridement is stable. Severity of Tissue Post Debridement is: Fat layer exposed. Post procedure Diagnosis Wound #1: Same as Pre-Procedure Plan Follow-up Appointments: Return Appointment in 1 week. - Dr. Heber Landis and Leveda Anna, RN (room 7) Tuesday 12:30pm 12/03/2021 Bathing/ Shower/ Hygiene: May shower and wash wound with soap and water. - prior to dressing change Edema Control - Lymphedema / SCD / Other: Elevate legs to the level of the heart or above  for 30 minutes daily and/or when sitting, a frequency of: - throughout the day Avoid standing for long periods of time. Moisturize legs daily. Off-Loading: Other: - Avoid any direct pressure to left foot Home Health: No change in wound care orders this week; continue Home Health for wound care. May utilize formulary equivalent dressing for wound treatment orders unless otherwise specified. - Apply Santyl to wound on left foot daily (home health to change 2-3x a week, daughter will change rest of the week) Other Home Health Orders/Instructions: - [facility] Therapy: Evaluate for HBO Therapy Indication: - Diabetic foot wound with Osteomyelitis-Wagner Grade 3 2.0 ATA for 90 Minutes with 2 Five (5) Minute Air Breaks T Number of Treatments: - 40 otal One treatments per day (delivered Monday through Friday unless otherwise specified in Special Instructions below): Finger stick Blood Glucose Pre- and Post- HBOT Treatment. Follow Hyperbaric Oxygen Glycemia Protocol Afrin (Oxymetazoline HCL) 0.05% nasal spray - 1 spray in both nostrils daily as needed prior to HBO treatment for difficulty clearing ears Radiology ordered were: X-ray, Chest - Chest Xray for HBO WOUND #1: - Foot Wound Laterality: Left, Medial Cleanser: Soap and Water 1 x Per Day/15 Days Discharge Instructions: May shower and wash wound with dial antibacterial soap and water prior to dressing change. Cleanser: Wound Cleanser 1 x Per Day/15 Days Discharge Instructions: Cleanse the wound with wound cleanser prior to applying a clean dressing using gauze sponges, not tissue or cotton balls. Peri-Wound Care: Zinc Oxide Ointment 30g tube 1 x Per Day/15 Days Discharge Instructions: Apply Zinc Oxide to periwound with each dressing change Prim Dressing: Santyl Ointment 1 x Per Day/15 Days ary Discharge Instructions: Apply nickel thick amount to wound bed as instructed Secondary Dressing: Woven Gauze Sponge, Non-Sterile 4x4  in 1 x Per Day/15 Days Discharge Instructions: Apply over primary dressing as directed. Secured With: Child psychotherapist, Sterile 2x75 (in/in) 1 x Per Day/15 Days Discharge Instructions: Secure with stretch gauze as directed. Secured With: 55M Medipore Public affairs consultant Surgical T 2x10 (in/yd) 1 x Per Day/15 Days ape Discharge Instructions: Secure with tape as directed. 1. Santyl 2. Follow-up with vein and vascular 3. Start HBO insurance approval process 4. Follow-up in 1 week Electronic Signature(s) Signed: 11/26/2021 2:16:05 PM By: Kalman Shan DO Entered By: Kalman Shan on 11/26/2021 14:15:21 -------------------------------------------------------------------------------- HxROS Details Patient Name: Date of Service: [address], [place] [name] 11/26/2021 11:15 A M Medical Record Number: [id number] Patient Account Number: [id number] Date of Birth/Sex: Treating RN: 10/10/1931 (88 y.o. M) Rolin Barry, Tammi Klippel  Primary Care Provider: [name] Other Clinician: Referring Provider: Treating Provider/Extender: Darl Pikes Weeks in Treatment: 3 Information Obtained From Patient Chart Cardiovascular Medical History: Positive for: Coronary Artery Disease; Hypertension; Myocardial Infarction; Peripheral Arterial Disease Gastrointestinal Medical History: Past Medical History Notes: GERD Endocrine Medical History: Positive for: Type II Diabetes Treated with: Insulin, Oral agents Blood sugar tested every day: No Immunizations Pneumococcal Vaccine: Received Pneumococcal Vaccination: Yes Received Pneumococcal Vaccination On or After 60th Birthday: No Implantable Devices None Family and Social History Unknown History: Yes; Former smoker; Alcohol Use: Never; Drug Use: No History; Caffeine Use: Rarely; Financial Concerns: No; Food, Clothing or Shelter Needs: No; Support System Lacking: No; Transportation Concerns: No Electronic  Signature(s) Signed: 11/26/2021 2:16:05 PM By: Kalman Shan DO Signed: 11/26/2021 5:07:46 PM By: Deon Pilling RN, [name] Entered By: Kalman Shan on 11/26/2021 13:29:04 -------------------------------------------------------------------------------- SuperBill Details Patient Name: Date of Service: [name], [place] [name] 11/26/2021 Medical Record Number: [id number] Patient Account Number: [id number] Date of Birth/Sex: Treating RN: 1931/08/08 (88 y.o. 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

## 2021-11-27 ENCOUNTER — Other Ambulatory Visit (HOSPITAL_COMMUNITY): Payer: Self-pay | Admitting: Internal Medicine

## 2021-11-27 DIAGNOSIS — L97508 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 with other specified severity: Secondary | ICD-10-CM

## 2021-11-27 DIAGNOSIS — Z7984 Long term (current) use of oral hypoglycemic drugs: Secondary | ICD-10-CM | POA: Diagnosis not present

## 2021-11-27 DIAGNOSIS — I739 Peripheral vascular disease, unspecified: Secondary | ICD-10-CM | POA: Diagnosis not present

## 2021-11-27 DIAGNOSIS — I2510 Atherosclerotic heart disease of native coronary artery without angina pectoris: Secondary | ICD-10-CM | POA: Diagnosis not present

## 2021-11-27 DIAGNOSIS — E785 Hyperlipidemia, unspecified: Secondary | ICD-10-CM | POA: Diagnosis not present

## 2021-11-27 DIAGNOSIS — Z48812 Encounter for surgical aftercare following surgery on the circulatory system: Secondary | ICD-10-CM | POA: Diagnosis not present

## 2021-11-27 DIAGNOSIS — L89892 Pressure ulcer of other site, stage 2: Secondary | ICD-10-CM | POA: Diagnosis not present

## 2021-11-27 DIAGNOSIS — E119 Type 2 diabetes mellitus without complications: Secondary | ICD-10-CM | POA: Diagnosis not present

## 2021-11-27 DIAGNOSIS — I10 Essential (primary) hypertension: Secondary | ICD-10-CM | POA: Diagnosis not present

## 2021-11-27 DIAGNOSIS — M1380 Other specified arthritis, unspecified site: Secondary | ICD-10-CM | POA: Diagnosis not present

## 2021-11-27 DIAGNOSIS — K219 Gastro-esophageal reflux disease without esophagitis: Secondary | ICD-10-CM | POA: Diagnosis not present

## 2021-11-28 ENCOUNTER — Ambulatory Visit (HOSPITAL_COMMUNITY)
Admission: RE | Admit: 2021-11-28 | Discharge: 2021-11-28 | Disposition: A | Discharge status: Home / Self Care | Payer: Medicare Other | Source: Ambulatory Visit | Attending: Internal Medicine | Admitting: Internal Medicine

## 2021-11-28 ENCOUNTER — Other Ambulatory Visit: Payer: Self-pay | Admitting: Internal Medicine

## 2021-11-28 ENCOUNTER — Ambulatory Visit
Admission: RE | Admit: 2021-11-28 | Discharge: 2021-11-28 | Disposition: A | Discharge status: Home / Self Care | Payer: Medicare Other | Source: Ambulatory Visit | Attending: Internal Medicine | Admitting: Internal Medicine

## 2021-11-28 DIAGNOSIS — I70202 Unspecified atherosclerosis of native arteries of extremities, left leg: Secondary | ICD-10-CM | POA: Diagnosis not present

## 2021-11-28 DIAGNOSIS — E1151 Type 2 diabetes mellitus with diabetic peripheral angiopathy without gangrene: Secondary | ICD-10-CM | POA: Diagnosis not present

## 2021-11-28 DIAGNOSIS — L97508 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 with other specified severity: Secondary | ICD-10-CM | POA: Insufficient documentation

## 2021-11-28 DIAGNOSIS — Z7984 Long term (current) use of oral hypoglycemic drugs: Secondary | ICD-10-CM | POA: Insufficient documentation

## 2021-11-28 DIAGNOSIS — J90 Pleural effusion, not elsewhere classified: Secondary | ICD-10-CM | POA: Diagnosis not present

## 2021-11-28 DIAGNOSIS — L97529 Non-pressure chronic ulcer of other part of left foot with unspecified severity: Secondary | ICD-10-CM | POA: Diagnosis not present

## 2021-11-28 DIAGNOSIS — R918 Other nonspecific abnormal finding of lung field: Secondary | ICD-10-CM | POA: Diagnosis not present

## 2021-11-28 DIAGNOSIS — E11621 Type 2 diabetes mellitus with foot ulcer: Secondary | ICD-10-CM | POA: Insufficient documentation

## 2021-11-28 DIAGNOSIS — E08621 Diabetes mellitus due to underlying condition with foot ulcer: Secondary | ICD-10-CM

## 2021-11-28 DIAGNOSIS — Z872 Personal history of diseases of the skin and subcutaneous tissue: Secondary | ICD-10-CM | POA: Diagnosis not present

## 2021-11-28 DIAGNOSIS — J929 Pleural plaque without asbestos: Secondary | ICD-10-CM | POA: Diagnosis not present

## 2021-12-03 ENCOUNTER — Encounter (HOSPITAL_BASED_OUTPATIENT_CLINIC_OR_DEPARTMENT_OTHER): Payer: Medicare Other | Admitting: Internal Medicine

## 2021-12-03 DIAGNOSIS — E1151 Type 2 diabetes mellitus with diabetic peripheral angiopathy without gangrene: Secondary | ICD-10-CM | POA: Diagnosis not present

## 2021-12-03 DIAGNOSIS — M86672 Other chronic osteomyelitis, left ankle and foot: Secondary | ICD-10-CM | POA: Diagnosis not present

## 2021-12-03 DIAGNOSIS — Z7984 Long term (current) use of oral hypoglycemic drugs: Secondary | ICD-10-CM | POA: Diagnosis not present

## 2021-12-03 DIAGNOSIS — I70245 Atherosclerosis of native arteries of left leg with ulceration of other part of foot: Secondary | ICD-10-CM | POA: Diagnosis not present

## 2021-12-03 DIAGNOSIS — E1169 Type 2 diabetes mellitus with other specified complication: Secondary | ICD-10-CM | POA: Diagnosis not present

## 2021-12-03 DIAGNOSIS — L97508 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 with other specified severity: Secondary | ICD-10-CM

## 2021-12-03 DIAGNOSIS — Z87891 Personal history of nicotine dependence: Secondary | ICD-10-CM | POA: Diagnosis not present

## 2021-12-03 DIAGNOSIS — E11621 Type 2 diabetes mellitus with foot ulcer: Secondary | ICD-10-CM | POA: Diagnosis not present

## 2021-12-03 NOTE — Progress Notes (Signed)
Harold [name] ([id number]) Visit Report for 12/03/2021 Chief Complaint Document Details Patient Name: Date of Service: Harold [name] 12/03/2021 12:30 PM Medical Record Number: [id number] Patient Account Number: [id number] Date of Birth/Sex: Treating RN: [birth year] (88 y.o. M) Primary Care Provider: [name] Other Clinician: Referring Provider: Treating Provider/Extender: Harold [name] in Treatment: 4 Information Obtained from: Patient Chief Complaint 11/04/2021; left foot wound Electronic Signature(s) Signed: 12/03/2021 2:00:11 PM By: Kalman Shan DO Entered By: Kalman Shan on 12/03/2021 13:47:03 -------------------------------------------------------------------------------- Debridement Details Patient Name: Date of Service: [place], [name]. 12/03/2021 12:30 PM Medical Record Number: [id number] Patient Account Number: [id number] Date of Birth/Sex: Treating RN: July 11, 1931 (88 y.o. [name] Primary Care Provider: [name] Other Clinician: Referring Provider: Treating Provider/Extender: Harold [name] in Treatment: 4 Debridement Performed for Assessment: Wound #1 Left,Medial Foot Performed By: Physician Kalman Shan, DO Debridement Type: Debridement Severity of Tissue Pre Debridement: Fat layer exposed Level of Consciousness (Pre-procedure): Awake and Alert Pre-procedure Verification/Time Out Yes - 13:03 Taken: Start Time: 13:04 Pain Control: Other : Benzocaine T Area Debrided (L x W): otal 2 (cm) x 2 (cm) = 4 (cm) Tissue and other material debrided: Non-Viable, Slough, Slough Level: Non-Viable Tissue Debridement Description: Selective/Open Wound Instrument: Blade, Forceps Bleeding: Minimum Hemostasis Achieved: Pressure End Time: 13:08 Response to Treatment: Procedure was tolerated well Level of Consciousness (Post- Awake and  Alert procedure): Post Debridement Measurements of Total Wound Length: (cm) 2.9 Width: (cm) 3.2 Depth: (cm) 0.1 Volume: (cm) 0.729 Character of Wound/Ulcer Post Debridement: Stable Severity of Tissue Post Debridement: Fat layer exposed Post Procedure Diagnosis Same as Pre-procedure Electronic Signature(s) Signed: 12/03/2021 2:00:11 PM By: Kalman Shan DO Signed: 12/03/2021 6:10:01 PM By: Lorrin Jackson Entered By: Lorrin Jackson on 12/03/2021 13:10:36 -------------------------------------------------------------------------------- HPI Details Patient Name: Date of Service: Harold [place] [address] [name] 12/03/2021 12:30 PM Medical Record Number: [id number] Patient Account Number: [id number] Date of Birth/Sex: Treating RN: 12-17-1931 (88 y.o. M) Primary Care Provider: [name] Other Clinician: Referring Provider: Treating Provider/Extender: Harold [name] in Treatment: 4 History of Present Illness HPI Description: Admission 11/04/2021 [name] is an 88 year old male with a past medical history of peripheral arterial disease, type 2 diabetes controlled on oral agents, former tobacco user that presents to the clinic for a medial left foot wound. He states that the wound started in March 2023. He noticed his foot was rubbing against in inside of his shoe creating a wound. He currently wears soft slippers to address this issue. He has been following with his podiatrist Dr. Cannon Kettle for this issue. On 09/19/2021 he had ABIs that were noncompressible with a TBI of 0.3 on the left. He subsequently had an abdominal aortogram on 4/28 that led to a femoropopliteal on 10/24/2021 by Dr. Stanford Breed. He has follow-up with vein and vascular on 11/26/2021. Patient reports chronic pain to the wound site. He has tried Iodoflex, Medihoney and antibiotic ointment to the wound bed with little benefit. 6/1; patient presents for follow-up. He obtained his x-ray that  showed new cortical erosion within the medial aspect of the great toe metatarsal head concerning for acute osteomyelitis. He has been using Santyl to the wound bed. He continues to report chronic pain to the wound site. He currently denies signs of infection. 6/7; he continues with Santyl as the primary dressing which his daughter is changing there has been some improvement in the  surface condition of the wound. He tolerates debridement very poorly. He is tolerating the antibiotics prescribed by infectious disease which is doxycycline and cefadroxil. He has follow-up arterial studies next week status post left Pham pop in April 6/13; patient presents for follow-up. He continues to use Santyl to the wound bed. He is still taking doxycycline and cefadroxil without issues. He has developed what appears to be a hematoma to the left upper leg. This is located to the area from where he had his femoropopliteal 1 month ago. He is not on blood thinner and denies signs of infection. He is scheduled to see vein and vascular today for this issue. 6/20; patient presents for follow-up. He has been using Santyl to the wound bed without issues. He is still on oral antibiotics. He was evaluated by vein and vascular for the fluid collection below below the popliteal incision which was more consistent with a seroma. This has resolved. Electronic Signature(s) Signed: 12/03/2021 2:00:11 PM By: Kalman Shan DO Entered By: Kalman Shan on 12/03/2021 13:49:14 -------------------------------------------------------------------------------- Physical Exam Details Patient Name: Date of Service: [name], [name] 12/03/2021 12:30 PM Medical Record Number: [id number] Patient Account Number: [id number] Date of Birth/Sex: Treating RN: Oct 28, 1931 (88 y.o. M) Primary Care Provider: [name] Other Clinician: Referring Provider: Treating Provider/Extender: Harold [name] in  Treatment: 4 Constitutional respirations regular, non-labored and within target range for patient.. Cardiovascular 2+ dorsalis pedis/posterior tibialis pulses. Psychiatric pleasant and cooperative. Notes GranulationLeft foot: T the medial aspect over the first met head there is an open wound with tissue and nonviable tissue present. The nonviable tissue is o tightly adhered. No surrounding signs of infection. Electronic Signature(s) Signed: 12/03/2021 2:00:11 PM By: Kalman Shan DO Entered By: Kalman Shan on 12/03/2021 13:49:55 -------------------------------------------------------------------------------- Physician Orders Details Patient Name: Date of Service: [place], [place]. 12/03/2021 12:30 PM Medical Record Number: [id number] Patient Account Number: [id number] Date of Birth/Sex: Treating RN: June 18, 1931 (88 y.o. 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 - with Dr. Heber [place] and Leveda Anna, RN (Room 7) 9:30am 12/10/2021 Return A Bathing/ Shower/ Hygiene May shower and wash wound with soap and water. - prior to dressing change Edema Control - Lymphedema / SCD / Other Elevate legs to the level of the heart or above for 30 minutes daily and/or when sitting, a frequency of: - throughout the day Avoid standing for long periods of time. Moisturize legs daily. Off-Loading Other: - Avoid any direct pressure to left foot Home Health No change in wound care orders this week;  continue Home Health for wound care. May utilize formulary equivalent dressing for wound treatment orders unless otherwise specified. - Apply Santyl to wound on left foot daily (home health to change 2-3x a week, daughter will change rest of the week) Other Home Health Orders/Instructions: - [facility] Hyperbaric Oxygen Therapy Evaluate for HBO Therapy Indication: - Diabetic foot wound with Osteomyelitis-Wagner Grade 3 2.0 ATA for 90 Minutes with 2 Five (5) Minute A Breaks ir Total Number of Treatments: - 40 One treatments per day (delivered  Monday through Friday unless otherwise specified in Special Instructions below): Finger stick Blood Glucose Pre- and Post- HBOT Treatment. Follow Hyperbaric Oxygen Glycemia Protocol Afrin (Oxymetazoline HCL) 0.05% nasal spray - 1 spray in both nostrils daily as needed prior to HBO treatment for difficulty clearing ears Wound Treatment Wound #1 - Foot Wound Laterality: Left, Medial Cleanser: Soap and Water 1 x Per Day/15 Days Discharge Instructions: May shower and wash wound with dial antibacterial soap and water prior to dressing change. Cleanser: Wound Cleanser 1 x Per Day/15 Days Discharge Instructions: Cleanse the wound with wound cleanser prior to applying a clean dressing using gauze sponges, not tissue or cotton balls. Peri-Wound Care: Zinc Oxide Ointment 30g tube 1 x Per Day/15 Days Discharge Instructions: Apply Zinc Oxide to periwound with each dressing change Prim Dressing: Santyl Ointment 1 x Per Day/15 Days ary Discharge Instructions: Apply nickel thick amount to wound bed as instructed Secondary Dressing: Woven Gauze Sponge, Non-Sterile 4x4 in 1 x Per Day/15 Days Discharge Instructions: Apply over primary dressing as directed. Secured With: Child psychotherapist, Sterile 2x75 (in/in) 1 x Per Day/15 Days Discharge Instructions: Secure with stretch gauze as directed. Secured With: 26M Medipore Public affairs consultant Surgical T 2x10 (in/yd) 1  x Per Day/15 Days ape Discharge Instructions: Secure with tape as directed. GLYCEMIA INTERVENTIONS PROTOCOL PRE-HBO GLYCEMIA INTERVENTIONS ACTION INTERVENTION Obtain pre-HBO capillary blood glucose (ensure 1 physician order is in chart). A. Notify HBO physician and await physician orders. 2 If result is 70 mg/dl or below: B. If the result meets the hospital definition of a critical result, follow hospital policy. A. Give patient an 8 ounce Glucerna Shake, an 8 ounce Ensure, or 8 ounces of a Glucerna/Ensure equivalent dietary supplement*. B. Wait 30 minutes. If result is 71 mg/dl to 130 mg/dl: C. Retest patients capillary blood glucose (CBG). D. If result greater than or equal to 110 mg/dl, proceed with HBO. If result less than 110 mg/dl, notify HBO physician and consider holding HBO. If result is 131 mg/dl to 249 mg/dl: A. Proceed with HBO. A. Notify HBO physician and await physician orders. B. It is recommended to hold HBO and do If result is 250 mg/dl or greater: blood/urine ketone testing. C. If the result meets the hospital definition of a critical result, follow hospital policy. POST-HBO GLYCEMIA INTERVENTIONS ACTION INTERVENTION Obtain post HBO capillary blood glucose (ensure 1 physician order is in chart). A. Notify HBO physician and await physician orders. 2 If result is 70 mg/dl or below: B. If the result meets the hospital definition of a critical result, follow hospital policy. A. Give patient an 8 ounce Glucerna Shake, an 8 ounce Ensure, or 8 ounces of a Glucerna/Ensure equivalent dietary supplement*. B. Wait 15 minutes for symptoms of If result is 71 mg/dl to 100 mg/dl: hypoglycemia (i.e. nervousness, anxiety, sweating, chills, clamminess, irritability, confusion, tachycardia or dizziness). C. If patient asymptomatic, discharge patient. If patient symptomatic, repeat capillary blood glucose (CBG) and notify HBO physician. If result is 101 mg/dl  to 249 mg/dl: A. Discharge patient. A. Notify HBO physician and await physician orders. B. It is recommended to do blood/urine ketone If result is 250 mg/dl or greater: testing. C. If the result meets the hospital definition of a critical result, follow hospital policy. *Juice or candies are NOT equivalent products. If patient refuses the Glucerna or Ensure, please consult the hospital dietitian for an appropriate substitute. Electronic Signature(s) Signed: 12/03/2021 2:00:11 PM By: Kalman Shan DO Entered By: Kalman Shan on 12/03/2021  13:50:55 -------------------------------------------------------------------------------- Problem List Details Patient Name: Date of Service: Harold [name] 12/03/2021 12:30 PM Medical Record Number: [id number] Patient Account Number: [id number] Date of Birth/Sex: Treating RN: 03-Oct-1931 (88 y.o. 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 12/03/2021 12:30 PM Medical Record Number: [id number] Patient Account Number: [id number] Date of  Birth/Sex: Treating RN: 08/25/1931 (88 y.o. M) Primary Care Provider: [name] Other Clinician: Referring Provider: Treating Provider/Extender: Harold [name] in Treatment: 4 Subjective Chief Complaint Information obtained from Patient 11/04/2021; left foot wound History of Present Illness (HPI) Admission 11/04/2021 Mr. Dreon Pineda is an 88 year old male with a past medical history of peripheral arterial disease, type 2 diabetes controlled on oral agents, former tobacco user that presents to the clinic for a medial left foot wound. He states that the wound started in March 2023. He noticed his foot was rubbing against in inside of his shoe creating a wound. He currently wears soft slippers to address this issue. He has been following with his podiatrist Dr. Cannon Kettle for this issue. On 09/19/2021 he had ABIs that were noncompressible with a TBI of 0.3 on the left. He subsequently had an abdominal aortogram on 4/28 that led to a femoropopliteal on 10/24/2021 by Dr. Stanford Breed. He has follow-up with vein and vascular on 11/26/2021. Patient reports chronic pain to the wound site. He has tried Iodoflex, Medihoney and antibiotic ointment to the wound bed with little benefit. 6/1; patient presents for follow-up. He obtained his x-ray that showed new cortical erosion within the medial aspect of the great toe metatarsal head concerning for acute osteomyelitis. He has been using Santyl to the wound bed. He continues to report chronic pain to the wound site. He currently denies signs of infection. 6/7; he continues with Santyl as the primary dressing which his daughter is changing there has been some improvement in the surface condition of the wound. He tolerates debridement very poorly. He is tolerating the antibiotics prescribed by infectious disease which is doxycycline and cefadroxil. He has follow-up arterial studies next week status post left Pham pop in  April 6/13; patient presents for follow-up. He continues to use Santyl to the wound bed. He is still taking doxycycline and cefadroxil without issues. He has developed what appears to be a hematoma to the left upper leg. This is located to the area from where he had his femoropopliteal 1 month ago. He is not on blood thinner and denies signs of infection. He is scheduled to see vein and vascular today for this issue. 6/20; patient presents for follow-up. He has been using Santyl to the wound bed without issues. He is still on oral antibiotics. He was evaluated by vein and vascular for the fluid collection below below the popliteal incision which was more consistent  with a seroma. This has resolved. Patient History Information obtained from Patient, Chart. Family History Unknown History. Social History Former smoker, Alcohol Use - Never, Drug Use - No History, Caffeine Use - Rarely. Medical History Cardiovascular Patient has history of Coronary Artery Disease, Hypertension, Myocardial Infarction, Peripheral Arterial Disease Endocrine Patient has history of Type II Diabetes Medical A Surgical History Notes nd Gastrointestinal GERD Objective Constitutional respirations regular, non-labored and within target range for patient.. Vitals Time Taken: 12:39 PM, Height: 71 in, Weight: 135 lbs, BMI: 18.8, Temperature: 98 F, Pulse: 75 bpm, Respiratory Rate: 18 breaths/min, Blood Pressure: 150/78 mmHg. Cardiovascular 2+ dorsalis pedis/posterior tibialis pulses. Psychiatric pleasant and cooperative. General Notes: GranulationLeft foot: T the medial aspect over the first met head there is an open wound with tissue and nonviable tissue present. The o nonviable tissue is tightly adhered. No surrounding signs of infection. Integumentary (Hair, Skin) Wound #1 status is Open. Original cause of wound was Pressure Injury. The date acquired was: 09/10/2021. The wound has been in treatment 4 [name].  The wound is located on the Left,Medial Foot. The wound measures 2.9cm length x 3.2cm width x 0.1cm depth; 7.288cm^2 area and 0.729cm^3 volume. There is Fat Layer (Subcutaneous Tissue) exposed. There is no tunneling or undermining noted. There is a medium amount of purulent drainage noted. The wound margin is distinct with the outline attached to the wound base. There is medium (34-66%) red granulation within the wound bed. There is a medium (34-66%) amount of necrotic tissue within the wound bed including Adherent Slough. 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 I was able to remove some nonviable tissue. I recommended continuing Santyl. We are still awaiting insurance approval for HBO therapy. EKG showed a right bundle branch block although this was unchanged from previous EKG. He has a history of open heart surgery with CABG and I recommended cardiac clearance before doing HBO. He had a chest x-ray that showed a chronic right pleural effusion with underlying opacity. I reviewed chest x-rays from 2022 and 2021 and the changes are present and chronic. This should not preclude him from HBO. Follow-up in 1 week. Procedures Wound #1 Pre-procedure diagnosis of Wound #1 is a Diabetic Wound/Ulcer of the Lower Extremity located on the Left,Medial Foot .Severity of Tissue Pre Debridement is: Fat layer exposed. There was a Selective/Open Wound Non-Viable Tissue Debridement with a total area of 4 sq cm performed by Kalman Shan, DO. With the following instrument(s): Blade, and Forceps to remove Non-Viable tissue/material. Material removed includes [place] after achieving pain control  using Other (Benzocaine). No specimens were taken. A time out was conducted at 13:03, prior to the start of the procedure. A Minimum amount of bleeding was controlled with Pressure. The procedure was tolerated well. Post Debridement Measurements: 2.9cm length x 3.2cm width x 0.1cm depth; 0.729cm^3 volume. Character of Wound/Ulcer Post Debridement is stable. Severity of Tissue Post Debridement is: Fat layer exposed. Post procedure Diagnosis Wound #1: Same as Pre-Procedure Plan Follow-up Appointments: Return Appointment in 1 week. - with Dr. Heber [place] and Leveda Anna, RN (Room 7) 9:30am 12/10/2021 Bathing/ Shower/ Hygiene: May shower and wash wound with soap and water. - prior to dressing change Edema Control - Lymphedema / SCD / Other: Elevate  legs to the level of the heart or above for 30 minutes daily and/or when sitting, a frequency of: - throughout the day Avoid standing for long periods of time. Moisturize legs daily. Off-Loading: Other: - Avoid any direct pressure to left foot Home Health: No change in wound care orders this week; continue Home Health for wound care. May utilize formulary equivalent dressing for wound treatment orders unless otherwise specified. - Apply Santyl to wound on left foot daily (home health to change 2-3x a week, daughter will change rest of the week) Other Home Health Orders/Instructions: - [facility] Hyperbaric Oxygen Therapy: Evaluate for HBO Therapy Indication: - Diabetic foot wound with Osteomyelitis-Wagner Grade 3 2.0 ATA for 90 Minutes with 2 Five (5) Minute Air Breaks T Number of Treatments: - 40 otal One treatments per day (delivered Monday through Friday unless otherwise specified in Special Instructions below): Finger stick Blood Glucose Pre- and Post- HBOT Treatment. Follow Hyperbaric Oxygen Glycemia Protocol Afrin (Oxymetazoline HCL) 0.05% nasal spray - 1 spray in both nostrils daily as needed prior to HBO treatment for difficulty clearing ears WOUND  #1: - Foot Wound Laterality: Left, Medial Cleanser: Soap and Water 1 x Per Day/15 Days Discharge Instructions: May shower and wash wound with dial antibacterial soap and water prior to dressing change. Cleanser: Wound Cleanser 1 x Per Day/15 Days Discharge Instructions: Cleanse the wound with wound cleanser prior to applying a clean dressing using gauze sponges, not tissue or cotton balls. Peri-Wound Care: Zinc Oxide Ointment 30g tube 1 x Per Day/15 Days Discharge Instructions: Apply Zinc Oxide to periwound with each dressing change Prim Dressing: Santyl Ointment 1 x Per Day/15 Days ary Discharge Instructions: Apply nickel thick amount to wound bed as instructed Secondary Dressing: Woven Gauze Sponge, Non-Sterile 4x4 in 1 x Per Day/15 Days Discharge Instructions: Apply over primary dressing as directed. Secured With: Child psychotherapist, Sterile 2x75 (in/in) 1 x Per Day/15 Days Discharge Instructions: Secure with stretch gauze as directed. Secured With: 10M Medipore Public affairs consultant Surgical T 2x10 (in/yd) 1 x Per Day/15 Days ape Discharge Instructions: Secure with tape as directed. 1. In office sharp debridement 2. Santyl daily 3. Follow-up in 1 week 4. Referral to cardiology for cardiac clearance Electronic Signature(s) Signed: 12/03/2021 2:00:11 PM By: Kalman Shan DO Entered By: Kalman Shan on 12/03/2021 13:58:38 -------------------------------------------------------------------------------- HxROS Details Patient Name: Date of Service: [address] [place]. 12/03/2021 12:30 PM Medical Record Number: [id number] Patient Account Number: [id number] Date of Birth/Sex: Treating RN: 1931-07-13 (88 y.o. M) Primary Care Provider: [name] Other Clinician: Referring Provider: Treating Provider/Extender: Harold [name] in Treatment: 4 Information Obtained From Patient Chart Cardiovascular Medical History: Positive for:  Coronary Artery Disease; Hypertension; Myocardial Infarction; Peripheral Arterial Disease Gastrointestinal Medical History: Past Medical History Notes: GERD Endocrine Medical History: Positive for: Type II Diabetes Treated with: Insulin, Oral agents Blood sugar tested every day: No Immunizations Pneumococcal Vaccine: Received Pneumococcal Vaccination: Yes Received Pneumococcal Vaccination On or After 60th Birthday: No Implantable Devices None Family and Social History Unknown History: Yes; Former smoker; Alcohol Use: Never; Drug Use: No History; Caffeine Use: Rarely; Financial Concerns: No; Food, Clothing or Shelter Needs: No; Support System Lacking: No; Transportation Concerns: No Electronic Signature(s) Signed: 12/03/2021 2:00:11 PM By: Kalman Shan DO Entered By: Kalman Shan on 12/03/2021 13:49:20 -------------------------------------------------------------------------------- SuperBill Details Patient Name: Date of Service: [place], [place]. 12/03/2021 Medical Record Number: [id number] Patient Account Number: [id number] Date of Birth/Sex: Treating  RN: 1931-10-14 (88 y.o. 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 Facility Procedures CPT4 Code: [id number] Description: [phone] - DEBRIDE WOUND 1ST 20 SQ CM OR < ICD-10 Diagnosis Description 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

## 2021-12-03 NOTE — Progress Notes (Signed)
Harold [name] ([id number]) Visit Report for 12/03/2021 Arrival Information Details Patient Name: Date of Service: Harold [name] 12/03/2021 12:30 PM Medical Record Number: [id number] Patient Account Number: [id number] Date of Birth/Sex: Treating RN: 1932/01/31 (88 y.o. Harold [name] Primary Care Linn Clavin: [name] Other Clinician: Referring Marlan Steward: Treating Eric Nees/Extender: Darl Pikes Weeks in Treatment: 4 Visit Information History Since Last Visit Added or deleted any medications: No Patient Arrived: Gilford Rile Any new allergies or adverse reactions: No Arrival Time: 12:35 Had a fall or experienced change in No Accompanied By: Daughter activities of daily living that may affect Transfer Assistance: None risk of falls: Patient Identification Verified: Yes Signs or symptoms of abuse/neglect since last visito No Secondary Verification Process Completed: Yes Hospitalized since last visit: No Patient Requires Transmission-Based Precautions: No Implantable device outside of the clinic excluding No Patient Has Alerts: Yes cellular tissue based products placed in the center Patient Alerts: R ABI [place] R TBI: 0.53 since last visit: L ABI [place] L TBI: 0.30 Has Dressing in Place as Prescribed: Yes Pain Present Now: Yes Electronic Signature(s) Signed: 12/03/2021 6:10:01 PM By: Lorrin Jackson Entered By: Lorrin Jackson on 12/03/2021 12:38:32 -------------------------------------------------------------------------------- Encounter Discharge Information Details Patient Name: Date of Service: [address] [name] 12/03/2021 12:30 PM Medical Record Number: [id number] Patient Account Number: [id number] Date of Birth/Sex: Treating RN: Nov 13, 1931 (88 y.o. Harold [name] Primary Care Jeriah Skufca: [name] Other Clinician: Referring Janique Hoefer: Treating Hades Mathew/Extender: Darl Pikes Weeks in Treatment:  4 Encounter Discharge Information Items Post Procedure Vitals Discharge Condition: Stable Temperature (F): 98 Ambulatory Status: Walker Pulse (bpm): 75 Discharge Destination: Home Respiratory Rate (breaths/min): 18 Transportation: Private Auto Blood Pressure (mmHg): 150/78 Accompanied By: daughter Schedule Follow-up Appointment: Yes Clinical Summary of Care: Provided on 12/03/2021 Form Type Recipient Paper Patient Patient Electronic Signature(s) Signed: 12/03/2021 6:10:01 PM By: Lorrin Jackson Entered By: Lorrin Jackson on 12/03/2021 13:22:30 -------------------------------------------------------------------------------- Lower Extremity Assessment Details Patient Name: Date of Service: Harold [place], [name] 12/03/2021 12:30 PM Medical Record Number: [id number] Patient Account Number: [id number] Date of Birth/Sex: Treating RN: 07/11/1931 (88 y.o. Harold [name] Primary Care Amaan Meyer: [name] Other Clinician: Referring Hugh Kamara: Treating Meryle Pugmire/Extender: Darl Pikes Weeks in Treatment: 4 Edema Assessment Assessed: Shirlyn Goltz: Yes] [Right: No] Edema: [Left: Ye] [Right: s] Calf Left: Right: Point of Measurement: 31 cm From Medial Instep 37 cm Ankle Left: Right: Point of Measurement: 10 cm From Medial Instep 24.8 cm Vascular Assessment Pulses: Dorsalis Pedis Palpable: [Left:Yes] Electronic Signature(s) Signed: 12/03/2021 6:10:01 PM By: Lorrin Jackson Entered By: Lorrin Jackson on 12/03/2021 12:45:35 -------------------------------------------------------------------------------- Multi Wound Chart Details Patient Name: Date of Service: 32 Harold [name] [name] 12/03/2021 12:30 PM Medical Record Number: [id number] Patient Account Number: [id number] Date of Birth/Sex: Treating RN: Apr 27, 1932 (88 y.o. M) Primary Care Clodagh Odenthal: [name] Other Clinician: Referring Nox Talent: Treating Marsia Cino/Extender: Darl Pikes Weeks in Treatment: 4 Vital Signs Height(in): 71 Pulse(bpm): 75 Weight(lbs): 135 Blood Pressure(mmHg): 150/78 Body Mass Index(BMI): 18.8 Temperature(F): 98 Respiratory Rate(breaths/min): 18 Photos: [1:Left, Medial Foot] [N/A:N/A N/A] Wound Location: [1:Pressure Injury] [N/A:N/A] Wounding Event: [1:Diabetic Wound/Ulcer of the Lower] [N/A:N/A] Primary Etiology: [1:Extremity Coronary Artery Disease,] [N/A:N/A] Comorbid History: [1:Hypertension, Myocardial Infarction, Peripheral Arterial Disease, Type II Diabetes 09/10/2021] [N/A:N/A] Date Acquired: [1:4] [N/A:N/A] Weeks of Treatment: [1:Open] [N/A:N/A] Wound Status: [1:No] [N/A:N/A] Wound Recurrence: [1:2.9x3.2x0.1] [N/A:N/A] Measurements L x W x D (cm) [1:7.288] [N/A:N/A] A (cm) :  rea [1:0.729] [N/A:N/A] Volume (cm) : [1:-27.30%] [N/A:N/A] % Reduction in A [1:rea: -27.20%] [N/A:N/A] % Reduction in Volume: [1:Unable to visualize wound bed] [N/A:N/A] Classification: [1:Medium] [N/A:N/A] Exudate A mount: [1:Purulent] [N/A:N/A] Exudate Type: [1:yellow, brown, green] [N/A:N/A] Exudate Color: [1:Distinct, outline attached] [N/A:N/A] Wound Margin: [1:Medium (34-66%)] [N/A:N/A] Granulation A mount: [1:Red] [N/A:N/A] Granulation Quality: [1:Medium (34-66%)] [N/A:N/A] Necrotic A mount: [1:Fat Layer (Subcutaneous Tissue): Yes N/A] Exposed Structures: [1:Fascia: No Tendon: No Muscle: No Joint: No Bone: No Small (1-33%)] [N/A:N/A] Epithelialization: [1:Debridement - Selective/Open Wound N/A] Debridement: Pre-procedure Verification/Time Out 13:03 [N/A:N/A] Taken: [1:Other] [N/A:N/A] Pain Control: [1:Slough] [N/A:N/A] Tissue Debrided: [1:Non-Viable Tissue] [N/A:N/A] Level: [1:4] [N/A:N/A] Debridement A (sq cm): [1:rea Blade, Forceps] [N/A:N/A] Instrument: [1:Minimum] [N/A:N/A] Bleeding: [1:Pressure] [N/A:N/A] Hemostasis A chieved: [1:Procedure was tolerated well] [N/A:N/A] Debridement Treatment Response:  [1:2.9x3.2x0.1] [N/A:N/A] Post Debridement Measurements L x W x D (cm) [1:0.729] [N/A:N/A] Post Debridement Volume: (cm) [1:Debridement] [N/A:N/A] Treatment Notes Wound #1 (Foot) Wound Laterality: Left, Medial Cleanser Soap and Water Discharge Instruction: May shower and wash wound with dial antibacterial soap and water prior to dressing change. Wound Cleanser Discharge Instruction: Cleanse the wound with wound cleanser prior to applying a clean dressing using gauze sponges, not tissue or cotton balls. Peri-Wound Care Zinc Oxide Ointment 30g tube Discharge Instruction: Apply Zinc Oxide to periwound with each dressing change Topical Primary Dressing Santyl Ointment Discharge Instruction: Apply nickel thick amount to wound bed as instructed Secondary Dressing Woven Gauze Sponge, Non-Sterile 4x4 in Discharge Instruction: Apply over primary dressing as directed. Secured With Conforming Stretch Gauze Bandage, Sterile 2x75 (in/in) Discharge Instruction: Secure with stretch gauze as directed. 55M Medipore Soft Cloth Surgical T 2x10 (in/yd) ape Discharge Instruction: Secure with tape as directed. Compression Wrap Compression Stockings Add-Ons Electronic Signature(s) Signed: 12/03/2021 2:00:11 PM By: Kalman Shan DO Entered By: Kalman Shan on 12/03/2021 13:39:00 -------------------------------------------------------------------------------- Multi-Disciplinary Care Plan Details Patient Name: Date of Service: [name], [place]. 12/03/2021 12:30 PM Medical Record Number: [id number] Patient Account Number: [id number] Date of Birth/Sex: Treating RN: 07/31/1931 (88 y.o. Harold [name] Primary Care Sherlie Boyum: [name] Other Clinician: Referring Nimisha Rathel: Treating Celest Reitz/Extender: Adline Mango in Treatment: 4 Multidisciplinary Care Plan reviewed with physician Active Inactive Abuse / Safety / Falls / Self Care  Management Nursing Diagnoses: Potential for falls Potential for injury related to falls Goals: Patient will not experience any injury related to falls Date Initiated: 11/04/2021 Target Resolution Date: 12/06/2021 Goal Status: Active Patient/caregiver will verbalize/demonstrate measures taken to prevent injury and/or falls Date Initiated: 11/04/2021 Target Resolution Date: 12/06/2021 Goal Status: Active Interventions: Assess Activities of Daily Living upon admission and as needed Assess fall risk on admission and as needed Assess: immobility, friction, shearing, incontinence upon admission and as needed Assess impairment of mobility on admission and as needed per policy Assess personal safety and home safety (as indicated) on admission and as needed Provide education on fall prevention Provide education on personal and home safety Notes: Nutrition Nursing Diagnoses: Impaired glucose control: actual or potential Goals: Patient/caregiver will maintain therapeutic glucose control Date Initiated: 11/04/2021 Target Resolution Date: 12/06/2021 Goal Status: Active Interventions: Assess HgA1c results as ordered upon admission and as needed Assess patient nutrition upon admission and as needed per policy Provide education on elevated blood sugars and impact on wound healing Notes: Wound/Skin Impairment Nursing Diagnoses: Impaired tissue integrity Knowledge deficit related to ulceration/compromised skin integrity Goals: Patient/caregiver will verbalize understanding of skin care regimen Date Initiated: 11/04/2021 Target Resolution Date: 12/06/2021 Goal Status:  Active Ulcer/skin breakdown will have a volume reduction of 30% by week 4 Date Initiated: 11/04/2021 Target Resolution Date: 12/06/2021 Goal Status: Active Interventions: Assess patient/caregiver ability to obtain necessary supplies Assess patient/caregiver ability to perform ulcer/skin care regimen upon admission and as  needed Assess ulceration(s) every visit Provide education on ulcer and skin care Notes: Electronic Signature(s) Signed: 12/03/2021 6:10:01 PM By: Lorrin Jackson Entered By: Lorrin Jackson on 12/03/2021 12:35:17 -------------------------------------------------------------------------------- Pain Assessment Details Patient Name: Date of Service: [place], [place]. 12/03/2021 12:30 PM Medical Record Number: [id number] Patient Account Number: [id number] Date of Birth/Sex: Treating RN: [birth year] (88 y.o. Harold [name] Primary Care Arliene Rosenow: [name] Other Clinician: Referring Samanta Gal: Treating Tylar Amborn/Extender: Darl Pikes Weeks in Treatment: 4 Active Problems Location of Pain Severity and Description of Pain Patient Has Paino Yes Site Locations Pain Location: Pain in Ulcers With Dressing Change: Yes Duration of the Pain. Constant / Intermittento Intermittent Rate the pain. Current Pain Level: 5 Character of Pain Describe the Pain: Tender, Throbbing Pain Management and Medication Current Pain Management: Medication: Yes Cold Application: No Rest: Yes Massage: No Activity: No T.KingN.S.: No Heat Application: No Leg drop or elevation: No Is the Current Pain Management Adequate: Adequate How does your wound impact your activities of daily livingo Sleep: No Bathing: No Appetite: No Relationship With Others: No Bladder Continence: No Emotions: No Bowel Continence: No Work: No Toileting: No Drive: No Dressing: No Hobbies: No Electronic Signature(s) Signed: 12/03/2021 6:10:01 PM By: Lorrin Jackson Entered By: Lorrin Jackson on 12/03/2021 12:39:00 -------------------------------------------------------------------------------- Patient/Caregiver Education Details Patient Name: Date of Service: [address] 6/20/2023andnbsp12:30 PM Medical Record Number: [id number] Patient Account Number: [id number] Date of  Birth/Gender: Treating RN: 1932/05/08 (88 y.o. Harold [name] Primary Care Physician: [name] Other Clinician: Referring Physician: Treating Physician/Extender: Adline Mango in Treatment: 4 Education Assessment Education Provided To: Patient Education Topics Provided Elevated Blood Sugar/ Impact on Healing: Methods: Explain/Verbal, Printed Responses: State content correctly Hyperbaric Oxygenation: Methods: Explain/Verbal Responses: State content correctly Wound/Skin Impairment: Methods: Explain/Verbal, Printed Responses: State content correctly Electronic Signature(s) Signed: 12/03/2021 6:10:01 PM By: Lorrin Jackson Entered By: Lorrin Jackson on 12/03/2021 12:35:42 -------------------------------------------------------------------------------- Wound Assessment Details Patient Name: Date of Service: [address] [name] 12/03/2021 12:30 PM Medical Record Number: [id number] Patient Account Number: [id number] Date of Birth/Sex: Treating RN: 10-12-1931 (88 y.o. Harold [name] Primary Care Maleyah Evans: [name] Other Clinician: Referring Jaydah Stahle: Treating Denzil Bristol/Extender: Darl Pikes Weeks in Treatment: 4 Wound Status Wound Number: 1 Primary Diabetic Wound/Ulcer of the Lower Extremity Etiology: Wound Location: Left, Medial Foot Wound Open Wounding Event: Pressure Injury Status: Date Acquired: 09/10/2021 Comorbid Coronary Artery Disease, Hypertension, Myocardial Infarction, Weeks Of Treatment: 4 History: Peripheral Arterial Disease, Type II Diabetes Clustered Wound: No Photos Wound Measurements Length: (cm) 2.9 Width: (cm) 3.2 Depth: (cm) 0.1 Area: (cm) 7.288 Volume: (cm) 0.729 % Reduction in Area: -27.3% % Reduction in Volume: -27.2% Epithelialization: Small (1-33%) Tunneling: No Undermining: No Wound Description Classification: Unable to visualize wound bed Wound  Margin: Distinct, outline attached Exudate Amount: Medium Exudate Type: Purulent Exudate Color: yellow, brown, green Foul Odor After Cleansing: No Slough/Fibrino Yes Wound Bed Granulation Amount: Medium (34-66%) Exposed Structure Granulation Quality: Red Fascia Exposed: No Necrotic Amount: Medium (34-66%) Fat Layer (Subcutaneous Tissue) Exposed: Yes Necrotic Quality: Adherent Slough Tendon Exposed: No Muscle Exposed: No Joint Exposed: No Bone Exposed: No Treatment Notes Wound #1 (Foot)  Wound Laterality: Left, Medial Cleanser Soap and Water Discharge Instruction: May shower and wash wound with dial antibacterial soap and water prior to dressing change. Wound Cleanser Discharge Instruction: Cleanse the wound with wound cleanser prior to applying a clean dressing using gauze sponges, not tissue or cotton balls. Peri-Wound Care Zinc Oxide Ointment 30g tube Discharge Instruction: Apply Zinc Oxide to periwound with each dressing change Topical Primary Dressing Santyl Ointment Discharge Instruction: Apply nickel thick amount to wound bed as instructed Secondary Dressing Woven Gauze Sponge, Non-Sterile 4x4 in Discharge Instruction: Apply over primary dressing as directed. Secured With Conforming Stretch Gauze Bandage, Sterile 2x75 (in/in) Discharge Instruction: Secure with stretch gauze as directed. 17M Medipore Soft Cloth Surgical T 2x10 (in/yd) ape Discharge Instruction: Secure with tape as directed. Compression Wrap Compression Stockings Add-Ons Electronic Signature(s) Signed: 12/03/2021 6:10:01 PM By: Lorrin Jackson Entered By: Lorrin Jackson on 12/03/2021 12:46:42 -------------------------------------------------------------------------------- Vitals Details Patient Name: Date of Service: [address] [place]. 12/03/2021 12:30 PM Medical Record Number: [id number] Patient Account Number: [id number] Date of Birth/Sex: Treating RN: 01-14-1932 (88 y.o. Harold [name] Primary Care Arnecia Ector: [name] Other Clinician: Referring Kendyl Festa: Treating Blimi Godby/Extender: Darl Pikes Weeks in Treatment: 4 Vital Signs Time Taken: 12:39 Temperature (F): 98 Height (in): 71 Pulse (bpm): 75 Weight (lbs): 135 Respiratory Rate (breaths/min): 18 Body Mass Index (BMI): 18.8 Blood Pressure (mmHg): 150/78 Reference Range: 80 - 120 mg / dl Electronic Signature(s) Signed: 12/03/2021 6:10:01 PM By: Lorrin Jackson Entered By: Lorrin Jackson on 12/03/2021 12:42:03

## 2021-12-04 ENCOUNTER — Ambulatory Visit: Payer: Medicare Other | Admitting: Internal Medicine

## 2021-12-04 DIAGNOSIS — M1380 Other specified arthritis, unspecified site: Secondary | ICD-10-CM | POA: Diagnosis not present

## 2021-12-04 DIAGNOSIS — I2510 Atherosclerotic heart disease of native coronary artery without angina pectoris: Secondary | ICD-10-CM | POA: Diagnosis not present

## 2021-12-04 DIAGNOSIS — K219 Gastro-esophageal reflux disease without esophagitis: Secondary | ICD-10-CM | POA: Diagnosis not present

## 2021-12-04 DIAGNOSIS — E119 Type 2 diabetes mellitus without complications: Secondary | ICD-10-CM | POA: Diagnosis not present

## 2021-12-04 DIAGNOSIS — E785 Hyperlipidemia, unspecified: Secondary | ICD-10-CM | POA: Diagnosis not present

## 2021-12-04 DIAGNOSIS — I739 Peripheral vascular disease, unspecified: Secondary | ICD-10-CM | POA: Diagnosis not present

## 2021-12-04 DIAGNOSIS — L89892 Pressure ulcer of other site, stage 2: Secondary | ICD-10-CM | POA: Diagnosis not present

## 2021-12-04 DIAGNOSIS — I10 Essential (primary) hypertension: Secondary | ICD-10-CM | POA: Diagnosis not present

## 2021-12-04 DIAGNOSIS — Z48812 Encounter for surgical aftercare following surgery on the circulatory system: Secondary | ICD-10-CM | POA: Diagnosis not present

## 2021-12-04 DIAGNOSIS — Z7984 Long term (current) use of oral hypoglycemic drugs: Secondary | ICD-10-CM | POA: Diagnosis not present

## 2021-12-05 DIAGNOSIS — I739 Peripheral vascular disease, unspecified: Secondary | ICD-10-CM | POA: Diagnosis not present

## 2021-12-05 DIAGNOSIS — E119 Type 2 diabetes mellitus without complications: Secondary | ICD-10-CM | POA: Diagnosis not present

## 2021-12-05 DIAGNOSIS — E785 Hyperlipidemia, unspecified: Secondary | ICD-10-CM | POA: Diagnosis not present

## 2021-12-05 DIAGNOSIS — I2510 Atherosclerotic heart disease of native coronary artery without angina pectoris: Secondary | ICD-10-CM | POA: Diagnosis not present

## 2021-12-05 DIAGNOSIS — K219 Gastro-esophageal reflux disease without esophagitis: Secondary | ICD-10-CM | POA: Diagnosis not present

## 2021-12-05 DIAGNOSIS — Z48812 Encounter for surgical aftercare following surgery on the circulatory system: Secondary | ICD-10-CM | POA: Diagnosis not present

## 2021-12-05 DIAGNOSIS — I10 Essential (primary) hypertension: Secondary | ICD-10-CM | POA: Diagnosis not present

## 2021-12-05 DIAGNOSIS — Z7984 Long term (current) use of oral hypoglycemic drugs: Secondary | ICD-10-CM | POA: Diagnosis not present

## 2021-12-05 DIAGNOSIS — L89892 Pressure ulcer of other site, stage 2: Secondary | ICD-10-CM | POA: Diagnosis not present

## 2021-12-05 DIAGNOSIS — M1380 Other specified arthritis, unspecified site: Secondary | ICD-10-CM | POA: Diagnosis not present

## 2021-12-06 DIAGNOSIS — Z7984 Long term (current) use of oral hypoglycemic drugs: Secondary | ICD-10-CM | POA: Diagnosis not present

## 2021-12-06 DIAGNOSIS — E785 Hyperlipidemia, unspecified: Secondary | ICD-10-CM | POA: Diagnosis not present

## 2021-12-06 DIAGNOSIS — I10 Essential (primary) hypertension: Secondary | ICD-10-CM | POA: Diagnosis not present

## 2021-12-06 DIAGNOSIS — Z48812 Encounter for surgical aftercare following surgery on the circulatory system: Secondary | ICD-10-CM | POA: Diagnosis not present

## 2021-12-06 DIAGNOSIS — L89892 Pressure ulcer of other site, stage 2: Secondary | ICD-10-CM | POA: Diagnosis not present

## 2021-12-06 DIAGNOSIS — K219 Gastro-esophageal reflux disease without esophagitis: Secondary | ICD-10-CM | POA: Diagnosis not present

## 2021-12-06 DIAGNOSIS — I739 Peripheral vascular disease, unspecified: Secondary | ICD-10-CM | POA: Diagnosis not present

## 2021-12-06 DIAGNOSIS — E119 Type 2 diabetes mellitus without complications: Secondary | ICD-10-CM | POA: Diagnosis not present

## 2021-12-06 DIAGNOSIS — I2510 Atherosclerotic heart disease of native coronary artery without angina pectoris: Secondary | ICD-10-CM | POA: Diagnosis not present

## 2021-12-06 DIAGNOSIS — M1380 Other specified arthritis, unspecified site: Secondary | ICD-10-CM | POA: Diagnosis not present

## 2021-12-09 DIAGNOSIS — I739 Peripheral vascular disease, unspecified: Secondary | ICD-10-CM | POA: Diagnosis not present

## 2021-12-09 DIAGNOSIS — H401111 Primary open-angle glaucoma, right eye, mild stage: Secondary | ICD-10-CM | POA: Diagnosis not present

## 2021-12-09 DIAGNOSIS — Z961 Presence of intraocular lens: Secondary | ICD-10-CM | POA: Diagnosis not present

## 2021-12-09 DIAGNOSIS — H0102A Squamous blepharitis right eye, upper and lower eyelids: Secondary | ICD-10-CM | POA: Diagnosis not present

## 2021-12-09 DIAGNOSIS — Z7984 Long term (current) use of oral hypoglycemic drugs: Secondary | ICD-10-CM | POA: Diagnosis not present

## 2021-12-09 DIAGNOSIS — H401122 Primary open-angle glaucoma, left eye, moderate stage: Secondary | ICD-10-CM | POA: Diagnosis not present

## 2021-12-09 DIAGNOSIS — K219 Gastro-esophageal reflux disease without esophagitis: Secondary | ICD-10-CM | POA: Diagnosis not present

## 2021-12-09 DIAGNOSIS — E119 Type 2 diabetes mellitus without complications: Secondary | ICD-10-CM | POA: Diagnosis not present

## 2021-12-09 DIAGNOSIS — E785 Hyperlipidemia, unspecified: Secondary | ICD-10-CM | POA: Diagnosis not present

## 2021-12-09 DIAGNOSIS — M1380 Other specified arthritis, unspecified site: Secondary | ICD-10-CM | POA: Diagnosis not present

## 2021-12-09 DIAGNOSIS — I10 Essential (primary) hypertension: Secondary | ICD-10-CM | POA: Diagnosis not present

## 2021-12-09 DIAGNOSIS — L89892 Pressure ulcer of other site, stage 2: Secondary | ICD-10-CM | POA: Diagnosis not present

## 2021-12-09 DIAGNOSIS — I2510 Atherosclerotic heart disease of native coronary artery without angina pectoris: Secondary | ICD-10-CM | POA: Diagnosis not present

## 2021-12-09 DIAGNOSIS — Z48812 Encounter for surgical aftercare following surgery on the circulatory system: Secondary | ICD-10-CM | POA: Diagnosis not present

## 2021-12-09 DIAGNOSIS — H0102B Squamous blepharitis left eye, upper and lower eyelids: Secondary | ICD-10-CM | POA: Diagnosis not present

## 2021-12-10 ENCOUNTER — Ambulatory Visit: Payer: Medicare Other | Admitting: Vascular Surgery

## 2021-12-10 ENCOUNTER — Telehealth: Payer: Self-pay | Admitting: *Deleted

## 2021-12-10 ENCOUNTER — Encounter (HOSPITAL_BASED_OUTPATIENT_CLINIC_OR_DEPARTMENT_OTHER): Payer: Medicare Other | Admitting: Internal Medicine

## 2021-12-10 DIAGNOSIS — L97508 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 with other specified severity: Secondary | ICD-10-CM

## 2021-12-10 DIAGNOSIS — Z87891 Personal history of nicotine dependence: Secondary | ICD-10-CM | POA: Diagnosis not present

## 2021-12-10 DIAGNOSIS — E1151 Type 2 diabetes mellitus with diabetic peripheral angiopathy without gangrene: Secondary | ICD-10-CM | POA: Diagnosis not present

## 2021-12-10 DIAGNOSIS — I70245 Atherosclerosis of native arteries of left leg with ulceration of other part of foot: Secondary | ICD-10-CM | POA: Diagnosis not present

## 2021-12-10 DIAGNOSIS — M86672 Other chronic osteomyelitis, left ankle and foot: Secondary | ICD-10-CM | POA: Diagnosis not present

## 2021-12-10 DIAGNOSIS — E11621 Type 2 diabetes mellitus with foot ulcer: Secondary | ICD-10-CM | POA: Diagnosis not present

## 2021-12-10 DIAGNOSIS — Z7984 Long term (current) use of oral hypoglycemic drugs: Secondary | ICD-10-CM | POA: Diagnosis not present

## 2021-12-10 DIAGNOSIS — E1169 Type 2 diabetes mellitus with other specified complication: Secondary | ICD-10-CM | POA: Diagnosis not present

## 2021-12-10 NOTE — Telephone Encounter (Signed)
Pt has appt new pt appt 12/16/21 with Dr. [name] for pre op clearance. I will send FYI to requesting the pt has new pt appt 12/16/21. Once the pt has been cleared Dr. [name] will have his nurse fax over clearance notes and any medication recommendations.

## 2021-12-10 NOTE — Telephone Encounter (Signed)
   Pre-operative Risk Assessment    Patient Name: Harold [name]  DOB: [birth year] MRN: [id number]    Pt will need new pt appt for pre op clearance. I will message our chart prep team and hand off notes that were received to our office.   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

## 2021-12-11 ENCOUNTER — Ambulatory Visit: Payer: Medicare Other | Admitting: Internal Medicine

## 2021-12-11 ENCOUNTER — Encounter: Payer: Self-pay | Admitting: Internal Medicine

## 2021-12-11 ENCOUNTER — Other Ambulatory Visit: Payer: Self-pay

## 2021-12-11 VITALS — BP 154/87 | HR 72 | Temp 97.7°F | Wt 130.8 lb

## 2021-12-11 DIAGNOSIS — M86172 Other acute osteomyelitis, left ankle and foot: Secondary | ICD-10-CM

## 2021-12-11 DIAGNOSIS — Z48812 Encounter for surgical aftercare following surgery on the circulatory system: Secondary | ICD-10-CM | POA: Diagnosis not present

## 2021-12-11 DIAGNOSIS — E785 Hyperlipidemia, unspecified: Secondary | ICD-10-CM | POA: Diagnosis not present

## 2021-12-11 DIAGNOSIS — E119 Type 2 diabetes mellitus without complications: Secondary | ICD-10-CM | POA: Diagnosis not present

## 2021-12-11 DIAGNOSIS — I2510 Atherosclerotic heart disease of native coronary artery without angina pectoris: Secondary | ICD-10-CM | POA: Diagnosis not present

## 2021-12-11 DIAGNOSIS — L89892 Pressure ulcer of other site, stage 2: Secondary | ICD-10-CM | POA: Diagnosis not present

## 2021-12-11 DIAGNOSIS — M1380 Other specified arthritis, unspecified site: Secondary | ICD-10-CM | POA: Diagnosis not present

## 2021-12-11 DIAGNOSIS — E13621 Other specified diabetes mellitus with foot ulcer: Secondary | ICD-10-CM

## 2021-12-11 DIAGNOSIS — Z7984 Long term (current) use of oral hypoglycemic drugs: Secondary | ICD-10-CM | POA: Diagnosis not present

## 2021-12-11 DIAGNOSIS — K219 Gastro-esophageal reflux disease without esophagitis: Secondary | ICD-10-CM | POA: Diagnosis not present

## 2021-12-11 DIAGNOSIS — L97509 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 with unspecified severity: Secondary | ICD-10-CM | POA: Diagnosis not present

## 2021-12-11 DIAGNOSIS — I739 Peripheral vascular disease, unspecified: Secondary | ICD-10-CM

## 2021-12-11 DIAGNOSIS — I10 Essential (primary) hypertension: Secondary | ICD-10-CM | POA: Diagnosis not present

## 2021-12-12 ENCOUNTER — Encounter: Payer: Self-pay | Admitting: Internal Medicine

## 2021-12-12 NOTE — Assessment & Plan Note (Addendum)
Seems to be healing well.  More tissue in the ulcer which remains about the same size. Tolerating the antibiotics well.   I will have him continue with the doxycycline and cefadroxil for 2 more weeks for 4 weeks total.  He will return at that time to recheck his ulcer and we can consider stopping antibiotics vs another two weeks for 6 weeks total.  They are aware he will see one of my partners due to my schedule.   I agree that definitive treatment with amputation would be optimal.

## 2021-12-12 NOTE — Assessment & Plan Note (Signed)
Needs continued good sugar management to reduce infection risk.

## 2021-12-12 NOTE — Progress Notes (Signed)
   Subjective:    Patient ID: Harold [name], male    DOB: [birth year], 88 y.o.   MRN: [id number]  HPI Here for follow up of osteomyelitis. He has a history of diabetes and PAD s/p fem/pop in May and developed a left toe ulcer and an xray concerning for osteomyelitis.  He continues with wound care and I started him on doxycycline and cefadroxil.  Initial ESR and CRP normal.  He has been taking the antibiotics well and no concerns.  Good granulation tissue.  No rash or diarrhea. Here with his daughter.  He saw Dr. Stanford Breed recently who continues to advocate for definitive treatment with amputation, though he continues to be reluctant.     Review of Systems  Constitutional:  Negative for chills and fever.  Gastrointestinal:  Negative for diarrhea.  Skin:  Negative for rash.       Objective:   Physical Exam Eyes:     General: No scleral icterus. Pulmonary:     Effort: Pulmonary effort is normal.  Musculoskeletal:     Comments: Left toe ulcer at the great toe metatarsal head area  Neurological:     Mental Status: He is alert.   SH: no current tobacco        Assessment & Plan:

## 2021-12-12 NOTE — Assessment & Plan Note (Addendum)
S/p fem/pop and continues on aspirin, lipid management.

## 2021-12-16 ENCOUNTER — Encounter: Payer: Self-pay | Admitting: Cardiology

## 2021-12-16 ENCOUNTER — Ambulatory Visit: Payer: Medicare Other | Admitting: Cardiology

## 2021-12-16 VITALS — BP 116/52 | HR 85 | Ht 69.0 in | Wt 127.4 lb

## 2021-12-16 DIAGNOSIS — Z01810 Encounter for preprocedural cardiovascular examination: Secondary | ICD-10-CM | POA: Diagnosis not present

## 2021-12-16 DIAGNOSIS — I10 Essential (primary) hypertension: Secondary | ICD-10-CM | POA: Diagnosis not present

## 2021-12-16 DIAGNOSIS — I2510 Atherosclerotic heart disease of native coronary artery without angina pectoris: Secondary | ICD-10-CM

## 2021-12-16 DIAGNOSIS — E785 Hyperlipidemia, unspecified: Secondary | ICD-10-CM | POA: Diagnosis not present

## 2021-12-16 DIAGNOSIS — I739 Peripheral vascular disease, unspecified: Secondary | ICD-10-CM

## 2021-12-16 DIAGNOSIS — Z951 Presence of aortocoronary bypass graft: Secondary | ICD-10-CM

## 2021-12-16 DIAGNOSIS — M86172 Other acute osteomyelitis, left ankle and foot: Secondary | ICD-10-CM

## 2021-12-16 NOTE — Patient Instructions (Addendum)
Medication Instructions:  No changes   *If you need a refill on your cardiac medications before your next appointment, please call your pharmacy*   Lab Work: Not needed    Testing/Procedure Will be schedule at [place] has requested that you have an echocardiogram. Echocardiography is a painless test that uses sound waves to create images of your heart. It provides your doctor with information about the size and shape of your heart and how well your heart's chambers and valves are working. This procedure takes approximately one hour. There are no restrictions for this procedure.   Follow-Up: At [facility], you and your health needs are our priority.  As part of our continuing mission to provide you with exceptional heart care, we have created designated Provider Care Teams.  These Care Teams include your primary Cardiologist (physician) and Advanced Practice Providers (APPs -  Physician Assistants and Nurse Practitioners) who all work together to provide you with the care you need, when you need it.     Your next appointment:    After echo   The format for your next appointment:   In Person  Provider:   Glenetta Hew, MD

## 2021-12-16 NOTE — Progress Notes (Unsigned)
Primary Care Provider: Josetta Huddle, MD [facility] Cardiologist: Glenetta Hew, MD Electrophysiologist: None  Clinic Note: No chief complaint on file.   ===================================  ASSESSMENT/PLAN   Problem List Items Addressed This Visit       Cardiology Problems   Coronary artery disease involving native coronary artery of native heart without angina pectoris - Primary (Chronic)   Hypertension (Chronic)     Other   Hx of CABG (Chronic)   Preop cardiovascular exam   Dyslipidemia (Chronic)    ===================================  HPI:    Harold [name] is a 88 y.o. male with a PMH notable for CAD documented back in roughly 2000 with stents followed by CABG times 08/2002 as well as now PAD s/p Left Femoropopliteal Bypass is being seen today for the PREOP EVALUATION - First Toe Amputation for at the request of Josetta Huddle, MD.  Harold [name] was last seen by Dr. Jenkins Rouge in September 2014 for preop evaluation for laparoscopic surgery. => Cardiac history derived from this note indicates that he had coronary stents in 2002 and subsequent CABGx3 by Dr. Roxan Hockey in 2004 (unfortunately, no records are available in epic).  Apparently been followed by Dr. Inda Merlin since the time of his surgery and has had several ETT's.  (Results not available).  At that time he was active doing yard work-mowing the lawn etc.  No chest pain dyspnea palpitations or syncope. Harold [name] has not been seen by cardiology in the clinic since his CABG.  -> He has been followed Dr. Stanford Breed from vascular surgery relating to an ischemic ulcer in the Left Foot.  He was found to have significant left lower extremity arterial disease and is recently undergone Femoropopliteal Bypass.  He is described in the clinic notes is being a spry, energetic 88 year old gentleman who looks younger than his stated age.  Reportedly quite active per his daughter who is a Advertising account executive at  [employer].  Recent Hospitalizations:  10/11/2021 Abdominal Aortogram with Left Lower Extremity Runoff (Dr. Stanford Breed): Terminal aortoiliac arteries: Right hypogastric artery occluded.  LLE: Widely patent CFA, PFA.  SFA is heavily diseased with Multifocal Chronic Total Occlusion, Above-Knee Pop A- heavily disease above the knee and behind the knee with multifocal critical stenosis-Below-Knee Pop A is without any significant disease.  AT trunk-dominant tibial vessels of the foot.  TP trunk patent but occludes distally.  Peroneal artery free consistent distal leg.  PT occluded pedal circulation fills via AT. => Plan is for vein mapping to determine conduit for femoropopliteal bypass, likely require PTFE. 10/24/2021 left CFA-BK Pop A (femoropopliteal) bypas -using 6 mm ePTFE graft.  He was seen by Dr. Stanford Breed on June 13 following his surgery.  Was tolerating well.  The left foot ulcer was feeling better.  Unfortunately there is evidence of osteomyelitis about the first metatarsal head along with some swelling about the popliteal incision (likely seroma) => with osteomyelitis evident, he was counseled that amputation of the First Toe would be the most definitive therapy.  He was initially reluctant to doing this and wanted to talk to his care team.  Was continued on antibiotics with plans for 1 month follow-up.  Reviewed  CV studies:    The following studies were reviewed today: (if available, images/films reviewed: From Epic Chart or Care Everywhere) Unfortunately no cardiac studies are available.:  Interval History:   Harold [name] presents here today for preop evaluation for his second surgery in the matter of the last several months.  For some reason, toe amputation is considered more important and needs cardiac clearance, but femoropopliteal surgery did not.  The femoropopliteal was done because of a nonhealing ulcer, and now he has osteomyelitis of the foot.  Surgery is a definitive and probably only  reliable option for treating him.  Despite this, he is referred for cardiac clearance and patient has not been seen by cardiology in almost 20 years.  CV Review of Symptoms (Summary) Cardiovascular ROS: {roscv:310661}  REVIEWED OF SYSTEMS   ROS  I have reviewed and (if needed) personally updated the patient's problem list, medications, allergies, past medical and surgical history, social and family history.   PAST MEDICAL HISTORY   Past Medical History:  Diagnosis Date   Arthritis    "generalized" (03/01/2013)   CAD (coronary artery disease)    Cataract    GERD (gastroesophageal reflux disease)    HOH (hard of hearing)    Hyperlipidemia    Myocardial infarction ([place]) 06/16/2002   PONV (postoperative nausea and vomiting)    Type II diabetes mellitus ([place])    Unspecified essential hypertension     PAST SURGICAL HISTORY   Past Surgical History:  Procedure Laterality Date   ABDOMINAL AORTOGRAM W/LOWER EXTREMITY N/A 10/11/2021   Procedure: ABDOMINAL AORTOGRAM W/LOWER EXTREMITY;  Surgeon: [name], MD;  Location: [place] [facility];  Service: Cardiovascular;  Laterality: N/A;  Left Lower Extremity   BACK SURGERY  1970s   CATARACT EXTRACTION W/ INTRAOCULAR LENS IMPLANT Left 2013   CHOLECYSTECTOMY N/A 03/02/2013   Procedure: LAPAROSCOPIC CHOLECYSTECTOMY;  Surgeon: Rolm Bookbinder, MD;  Location: [place];  Service: General;  Laterality: N/A;   CORONARY ANGIOPLASTY WITH STENT PLACEMENT     "had 2-3; they clogged; he later had OHS" (03/01/2013)   CORONARY ARTERY BYPASS GRAFT  02/2003   "triple" (03/01/2013)   FEMORAL-POPLITEAL BYPASS GRAFT Left 10/24/2021   Procedure: BYPASS LEFT COMMON FEMORAL-BELOW KNEE POPLITEAL ARTERY WITH 6MM BY 80CM RING GRAFT;  Surgeon: [name], MD;  Location: [place];  Service: Vascular;  Laterality: Left;    Immunization History  Administered Date(s) Administered   DTP 11/23/2004   Influenza Split 03/20/2009, 02/19/2011, 03/21/2012, 04/07/2013,  03/14/2014, 03/23/2015, 02/18/2016, 02/22/2018, 03/02/2020, 03/05/2021   Influenza-Unspecified 03/20/2017, 03/19/2019   PFIZER(Purple Top)SARS-COV-2 Vaccination 07/31/2019, 08/23/2019, 01/08/2021   Pneumococcal Conjugate-13 06/29/2014   Pneumococcal Polysaccharide-23 02/26/2001, 12/05/2009    MEDICATIONS/ALLERGIES   Current Meds  Medication Sig   ACCU-CHEK AVIVA PLUS test strip    Accu-Chek Softclix Lancets lancets SMARTSIG:Topical   amLODipine (NORVASC) 5 MG tablet Take 5 mg by mouth daily.   aspirin EC 81 MG tablet Take 81 mg by mouth daily.   Cholecalciferol (VITAMIN D3) 50 MCG (2000 UT) TABS Take 2,000 Units by mouth daily.   dorzolamide-timolol (COSOPT) 22.3-6.8 MG/ML ophthalmic solution 1 drop 2 (two) times daily.   gabapentin (NEURONTIN) 100 MG capsule Take 100 mg by mouth 3 (three) times daily.   latanoprost (XALATAN) 0.005 % ophthalmic solution Place 1 drop into both eyes at bedtime.   lidocaine (XYLOCAINE) 2 % jelly Apply 1 application. topically 3 (three) times daily as needed.   lidocaine (XYLOCAINE) 5 % ointment 1 application. daily as needed for mild pain or moderate pain.   lisinopril-hydrochlorothiazide (ZESTORETIC) 20-25 MG tablet Take 1 tablet by mouth daily.   metFORMIN (GLUCOPHAGE) 500 MG tablet Take 500 mg by mouth 2 (two) times daily with a meal.   pantoprazole (PROTONIX) 40 MG tablet Take 40 mg by mouth daily.  SANTYL 250 UNIT/GM ointment Apply topically daily.   simvastatin (ZOCOR) 80 MG tablet Take 40 mg by mouth at bedtime.   [DISCONTINUED] cefadroxil (DURICEF) 500 MG capsule Take 2 capsules (1,000 mg total) by mouth 2 (two) times daily.   [DISCONTINUED] doxycycline (VIBRAMYCIN) 100 MG capsule Take 1 capsule (100 mg total) by mouth 2 (two) times daily.    No Known Allergies  SOCIAL HISTORY/FAMILY HISTORY   Reviewed in Epic:   Social History   Tobacco Use   Smoking status: Former    Years: 30.00    Types: Cigarettes   Smokeless tobacco: Never   Vaping Use   Vaping Use: Never used  Substance Use Topics   Alcohol use: Not Currently    Comment: 03/01/2013 "drank years ago; never had problem w/it"   Drug use: No   Social History   Social History Narrative   Not on file   Family History  Problem Relation Age of Onset   Heart disease Mother    Diabetes Mother     OBJCTIVE -PE, EKG, labs   Wt Readings from Last 3 Encounters:  12/16/21 127 lb 6.4 oz (57.8 kg)  12/11/21 130 lb 12.8 oz (59.3 kg)  11/26/21 138 lb (62.6 kg)    Physical Exam: BP (!) 116/52   Pulse 85   Ht '5\' 9"'$  (1.753 m)   Wt 127 lb 6.4 oz (57.8 kg)   SpO2 100%   BMI 18.81 kg/m  Physical Exam   Adult ECG Report  Rate: *** ;  Rhythm: {rhythm:17366};   Narrative Interpretation: ***  Recent Labs:  ***  Lab Results  Component Value Date   CHOL 136 10/25/2021   HDL 55 10/25/2021   LDLCALC 71 10/25/2021   TRIG 51 10/25/2021   CHOLHDL 2.5 10/25/2021   Lab Results  Component Value Date   CREATININE 1.05 11/15/2021   BUN 20 11/15/2021   NA 143 11/15/2021   K 4.1 11/15/2021   CL 106 11/15/2021   CO2 29 11/15/2021      Latest Ref Rng & Units 10/25/2021    2:41 AM 10/24/2021    8:44 AM 10/11/2021    8:13 AM  CBC  WBC 4.0 - 10.5 K/uL 8.7  7.8    Hemoglobin 13.0 - 17.0 g/dL 10.9  10.5  12.9   Hematocrit 39.0 - 52.0 % 32.2  34.3  38.0   Platelets 150 - 400 K/uL 235  205      Lab Results  Component Value Date   HGBA1C 6.7 (H) 10/25/2021   No results found for: "TSH"  ==================================================  COVID-19 Education: The signs and symptoms of COVID-19 were discussed with the patient and how to seek care for testing (follow up with PCP or arrange E-visit).    I spent a total of *** minutes with the patient spent in direct patient consultation.  Additional time spent with chart review  / charting (studies, outside notes, etc): *** min Total Time: *** min  Current medicines are reviewed at length with the patient  today.  (+/- concerns) ***  This visit occurred during the SARS-CoV-2 public health emergency.  Safety protocols were in place, including screening questions prior to the visit, additional usage of staff PPE, and extensive cleaning of exam room while observing appropriate contact time as indicated for disinfecting solutions.  Notice: This dictation was prepared with Dragon dictation along with smart phrase technology. Any transcriptional errors that result from this process are unintentional and may not be corrected  upon review.   Studies Ordered:  No orders of the defined types were placed in this encounter.  No orders of the defined types were placed in this encounter.   Patient Instructions / Medication Changes & Studies & Tests Ordered   There are no Patient Instructions on file for this visit.    Glenetta Hew, M.D., M.S. Interventional Cardiologist   Pager # [phone] Phone # [phone] [address] [place], [place] [address]   Thank you for choosing Heartcare at [facility]!!

## 2021-12-17 ENCOUNTER — Encounter: Payer: Self-pay | Admitting: Cardiology

## 2021-12-17 NOTE — Assessment & Plan Note (Signed)
Just about 19 years from his bypass surgery but he has not had any symptoms.  I would not be surprised if some of his 3 grafts are closed in his native progress, but he does not have anything on him since then.   He was having follow-up stress test on initially post CABG but has not had anything since well before 2014.  No indication for checking 1 now on lack of symptoms besides just mild generalized fatigue, which could be associated with poor sleep habits.  We will try to get CABG Reports.

## 2021-12-17 NOTE — Assessment & Plan Note (Signed)
Significant PAD with recent femoropopliteal bypass using PTFE graft.  Likely not done the last long.  This was done to help wound healing.  The wound itself is healed, but he has developed osteomyelitis.  Best course of action would be metatarsal amputation, but he does not want to go through amputation therefore they cannot try hyperbaric chamber.  I see no reason for him not to have hyperbaric treatment but we will check a 2D echo just to be sure there is no gross abnormalities from a cardiac standpoint.

## 2021-12-17 NOTE — Assessment & Plan Note (Signed)
Blood pressure looks pretty well controlled on combination of amlodipine and lisinopril-HCTZ.  Would probably hold HCTZ if he were to go for surgery

## 2021-12-17 NOTE — Assessment & Plan Note (Addendum)
Skin ulcer wound healing well, but unfortunately he now has great toe osteomyelitis which is currently being treated with antibiotics by infectious disease MD.  He completed a total of 4-week treatment with doxycycline and cefadroxil.  He says he was put back on antibiotics recently. Most definitive treatment would be amputation, but he is very reluctant to do so.   He is very leery of actually having an amputation, fearful of going on the knife and losing a part of himself.  I do not think he has the concept that he actually is potentially doing himself more harm by not amputating and that he could get systemic illness.  Since I am not involved in any of the procedures I will hold off on further counseling at this point.  Plan: Check 2D echocardiogram to see if he would be safe going through with hyperbaric treatment.  Only dramatically reduced EF would cause [place] to have concern.  Plan is for him to return for short follow-up in 4 days to discuss results we will get the echo done on the 7/5 and see him back on the 7/7.

## 2021-12-17 NOTE — Assessment & Plan Note (Signed)
On simvastatin 80 mg daily.  Lipids are pretty well controlled.  Would not change.

## 2021-12-17 NOTE — Assessment & Plan Note (Addendum)
Unfortunately we do not have the cath or CABG report from 2002 in 2004.  Still working on try to get that but he has not had any symptoms whatsoever of angina since his CABG.  Also has not had any further evaluation since at least 2014.  He followed with Dr. Leonia Reeves until he left [place].  Then he stopped following up with anybody.  He is already on amlodipine and lisinopril and HCTZ.  Not on beta-blocker which would be the one potential thing that we could add but with upcoming procedure we will hold off.  He is on aspirin and statin.  In the absence of symptoms, no need for ischemic evaluation.  Given his advanced age, he would probably not be willing to go forward with cardiac catheterization. => No indication for stress test.  Plan: Continue beta-blocker, ACE inhibitor and statin along with aspirin.  We will check a 2D echo great cardiogram to schedule a baseline assessment of his EF.  Unless he has dramatically reduced EF, I probably would not consider an ischemic evaluation for him for hyperbaric treatment or for follow-up surgery.  As long as his EF is still relatively preserved, would not do any further ischemic evaluation based on discussion with the patient.  Of course she may change her mind if she starts having more symptoms that she wants to answer the question, but as of present his symptoms a are more related to deconditioning.  I did would not order a stress test because this would only serve to delay necessary treatment for potential life-threatening infection.  I do not know of an indication or deep contraindication for outpatient management of his PAD that would preclude from proceeding   I did suggest that he probably from overall health standpoint would be better off having toe amputation.

## 2021-12-17 NOTE — Assessment & Plan Note (Signed)
No indication for preoperative stress test.  We will check 2D echo to get a baseline EF assessment.  Only if his EF is notably reduced would he be unstable for hyperbaric chamber.  Okay to hold aspirin if necessary for surgeries or procedures.

## 2021-12-18 ENCOUNTER — Ambulatory Visit (HOSPITAL_COMMUNITY): Payer: Medicare Other | Attending: Cardiology

## 2021-12-18 DIAGNOSIS — I2510 Atherosclerotic heart disease of native coronary artery without angina pectoris: Secondary | ICD-10-CM | POA: Diagnosis not present

## 2021-12-18 DIAGNOSIS — Z01810 Encounter for preprocedural cardiovascular examination: Secondary | ICD-10-CM | POA: Diagnosis not present

## 2021-12-18 DIAGNOSIS — Z951 Presence of aortocoronary bypass graft: Secondary | ICD-10-CM | POA: Insufficient documentation

## 2021-12-18 HISTORY — PX: TRANSTHORACIC ECHOCARDIOGRAM: SHX275

## 2021-12-18 LAB — ECHOCARDIOGRAM COMPLETE
Area-P 1/2: 2.64 cm2
S' Lateral: 2.3 cm

## 2021-12-19 ENCOUNTER — Telehealth: Payer: Self-pay | Admitting: Cardiology

## 2021-12-19 NOTE — Telephone Encounter (Signed)
Calling to let Dr. Ellyn Hack know that they need a note stating that pt is cleared for Hyperbaric Therapy. Note can be put into Epic. Please advise

## 2021-12-19 NOTE — Telephone Encounter (Signed)
Called an spoke to [facility]- Hyperbaric therapy clinic- informed patient has an appointment on 08/20/21 with Dr Ellyn Hack.- to clear for therapy.

## 2021-12-20 ENCOUNTER — Ambulatory Visit: Payer: Medicare Other | Admitting: Cardiology

## 2021-12-20 ENCOUNTER — Encounter: Payer: Self-pay | Admitting: Cardiology

## 2021-12-20 VITALS — BP 121/61 | HR 78 | Ht 69.0 in | Wt 125.8 lb

## 2021-12-20 DIAGNOSIS — M86172 Other acute osteomyelitis, left ankle and foot: Secondary | ICD-10-CM

## 2021-12-20 DIAGNOSIS — E785 Hyperlipidemia, unspecified: Secondary | ICD-10-CM | POA: Diagnosis not present

## 2021-12-20 DIAGNOSIS — Z951 Presence of aortocoronary bypass graft: Secondary | ICD-10-CM

## 2021-12-20 DIAGNOSIS — I739 Peripheral vascular disease, unspecified: Secondary | ICD-10-CM

## 2021-12-20 DIAGNOSIS — Z01810 Encounter for preprocedural cardiovascular examination: Secondary | ICD-10-CM | POA: Diagnosis not present

## 2021-12-20 DIAGNOSIS — I10 Essential (primary) hypertension: Secondary | ICD-10-CM

## 2021-12-20 DIAGNOSIS — I2510 Atherosclerotic heart disease of native coronary artery without angina pectoris: Secondary | ICD-10-CM | POA: Diagnosis not present

## 2021-12-20 NOTE — Assessment & Plan Note (Signed)
Normal echocardiogram with no active symptoms.  Would not further evaluate prior to hypermetabolic chamber use.  Too close to procedure to consider adding beta-blocker, this can be considered in the future.  Otherwise for now continue current dose of statin, calcium channel blocker and ACE-HCTZ.  Would hold the ACE I-HCTZ preop. No real heart failure symptoms and normal echo, okay to hydrate if necessary.

## 2021-12-20 NOTE — Assessment & Plan Note (Signed)
On simvastatin high-dose with very well-controlled lipids for now.  Once all the surgery issues are resolved, we could potentially from simvastatin to likely rosuvastatin

## 2021-12-20 NOTE — Assessment & Plan Note (Signed)
Blood pressure looks great on current meds.  Would not want to be any more aggressive in order to avoid any orthostasis.  Especially, since he is losing weight with protein malnutrition. Would recommend holding lisinopril and HCTZ the morning of surgery.

## 2021-12-20 NOTE — Patient Instructions (Addendum)
Medication Instructions:  No changes   *If you need a refill on your cardiac medications before your next appointment, please call your pharmacy*   Lab Work: Not needed    Testing/Procedures: Not needed   Follow-Up: At [facility], you and your health needs are our priority.  As part of our continuing mission to provide you with exceptional heart care, we have created designated Provider Care Teams.  These Care Teams include your primary Cardiologist (physician) and Advanced Practice Providers (APPs -  Physician Assistants and Nurse Practitioners) who all work together to provide you with the care you need, when you need it.     Your next appointment:   6 month(s)  Jan  2024  The format for your next appointment:   In Person  Provider:   Glenetta Hew, MD    Other Instructions   Eat anything  you want    Okay  you are  cleared  for hyperbaric therapy from cardiac standpoint , no further cardiac evaluation needed

## 2021-12-20 NOTE — Assessment & Plan Note (Signed)
Still has relatively normal wall motion suggesting that his grafts are patent. Still trying to get full CABG report

## 2021-12-20 NOTE — Assessment & Plan Note (Addendum)
Remains asymptomatic.  Thankfully, no significant wall motion normalities on Echo which would suggest graft patency.  This is consistent with him not having heart failure symptoms.  In the absence of anginal symptoms, no indication for checking a stress test as part of preprocedural evaluation.  He is on stable dose of aspirin, statin, ACE inhibitor along with dihydropyridine calcium channel blocker   Recommendations: Continue current meds at the current doses.  Okay to hold aspirin if necessary for procedures or surgeries.  Would only consider stress test evaluation if he were to have symptoms of exertional chest discomfort or dyspnea.  Not warranted prior to procedure.  Not currently on beta-blocker, but would prefer to avoid adjusting medications as close to procedure.  I think he is asymptomatic, and has not been on a beta-blocker for many years, no recent acutely

## 2021-12-20 NOTE — Assessment & Plan Note (Signed)
From a claudication standpoint and a wound healing standpoint he is doing great after his bypass surgery.  Unfortunately he still has the osteomyelitis.  He does have an steady gait most because the foot hurts him.  Continue current aggressive CRF medication as per CAD

## 2021-12-20 NOTE — Assessment & Plan Note (Signed)
I still concur with Dr. Stanford Breed that the probably best definitive treatment would be first ray amputation to avoid any further progression of osteomyelitis. I think he is feeling of lethargic and fatigued with no energy and strength partly because of his ongoing osteomyelitis infection.  Current plans are to review hyperbaric chamber options--actually later today.

## 2021-12-20 NOTE — Progress Notes (Signed)
Primary Care Provider: Josetta Huddle, MD [facility] Cardiologist: Glenetta Hew, MD Electrophysiologist: None Vascular Surgeon: Dr. Stanford Breed  Clinic Note: Chief Complaint  Patient presents with   Follow-up    After echocardiogram-preop   Coronary Artery Disease    No angina.  Normal echo.   ===================================  ASSESSMENT/PLAN   Problem List Items Addressed This Visit       Cardiology Problems   Coronary artery disease involving native coronary artery of native heart without angina pectoris (Chronic)    Remains asymptomatic.  Thankfully, no significant wall motion normalities on Echo which would suggest graft patency.  This is consistent with him not having heart failure symptoms.  In the absence of anginal symptoms, no indication for checking a stress test as part of preprocedural evaluation.  He is on stable dose of aspirin, statin, ACE inhibitor along with dihydropyridine calcium channel blocker   Recommendations: Continue current meds at the current doses.  Okay to hold aspirin if necessary for procedures or surgeries. Would only consider stress test evaluation if he were to have symptoms of exertional chest discomfort or dyspnea.  Not warranted prior to procedure. Not currently on beta-blocker, but would prefer to avoid adjusting medications as close to procedure.  I think he is asymptomatic, and has not been on a beta-blocker for many years, no recent acutely       Essential hypertension - Primary (Chronic)    Blood pressure looks great on current meds.  Would not want to be any more aggressive in order to avoid any orthostasis.  Especially, since he is losing weight with protein malnutrition. Would recommend holding lisinopril and HCTZ the morning of surgery.       PAD (peripheral artery disease) (HCC) (Chronic)    From a claudication standpoint and a wound healing standpoint he is doing great after his bypass surgery.  Unfortunately he still has  the osteomyelitis.  He does have an steady gait most because the foot hurts him.  Continue current aggressive CRF medication as per CAD        Other   Hx of CABG (Chronic)    Still has relatively normal wall motion suggesting that his grafts are patent. Still trying to get full CABG report      Preop cardiovascular exam    Normal echocardiogram with no active symptoms.  Would not further evaluate prior to hypermetabolic chamber use.  Too close to procedure to consider adding beta-blocker, this can be considered in the future.  Otherwise for now continue current dose of statin, calcium channel blocker and ACE-HCTZ.  Would hold the ACE I-HCTZ preop. No real heart failure symptoms and normal echo, okay to hydrate if necessary.      Acute osteomyelitis involving ankle and foot, left (HCC) (Chronic)    I still concur with Dr. Stanford Breed that the probably best definitive treatment would be first ray amputation to avoid any further progression of osteomyelitis. I think he is feeling of lethargic and fatigued with no energy and strength partly because of his ongoing osteomyelitis infection.  Current plans are to review hyperbaric chamber options--actually later today.      Dyslipidemia (Chronic)    On simvastatin high-dose with very well-controlled lipids for now.  Once all the surgery issues are resolved, we could potentially from simvastatin to likely rosuvastatin      ===================================  HPI:    Harold [name] is a 88 y.o. male with a PMH notable for CAD documented back in roughly  2000 with stents followed by CABG times 08/2002 as well as now PAD s/p Left Femoropopliteal Bypass is being seen today for close follow-up to discuss ECHOCARDIOGRAM results for complete PREPROCEDURE EVALUATION for hyperbaric chamber.  CARDIOVASCULAR HISTORY CAD-PCI (2000) CABG ~x3 (2004)-Dr. Hendrickson-records not available. Per report, he had had several different stress test done  from 2014 2014, but was lost to follow-up when Dr. Ilda Foil moved from [place]. PAD: 10/24/2021 Left CFA-BK Pop A (femoropopliteal) bypas -using 6 mm ePTFE graft.-Performed to help none healing wound ulcer. Wound healing well, but now complicated by left great toe osteomyelitis.  EFSTATHIOS SAWIN was just seen on July 3 for PREPROCEDURAL EVALUATION PRIOR TO UNDERGOING HYPERBARIC CHAMBER THERAPY FOR NONSURGICAL TREATMENT OF OSTEOMYELITIS at the request of Josetta Huddle, MD. Dorris is not a great historian, a lot of the history is provided by his daughter (who is a Merchandiser, retail @ [employer]).  He had just been seen by Dr. Stanford Breed, it appears it is wound was healing well, but the left great toe showed signs of osteomyelitis which was concerning.  The obvious initial recommendation in the situation would be great toe amputation.  Avoiding did not want to go forward with this.  As such, he was working with the [facility], and the potential option of the hyperbaric chamber treatment was offered along with antibiotics.  He has pretty abnormal baseline EKG with RBBB, large voltage.  T wave inversions etc.  As such, he was referred back to cardiology evaluation.  This had been his first cardiology visit (first time being seen by cardiologist) since September 2014. => Leading up to his recent hospitalization with the cellulitis and ulcer, he had been pretty active doing what ever he wanted to.  No complaints of chest pain pressure or dyspnea with rest or exertion.  However since the onset of these infections he has been pretty sedentary and pretty lethargic and tired.  He just gets worn out with this but any activity.  He still however denies any chest pain or pressure. => 2D Echocardiogram ordered to get a baseline cardiac function and determine if any other further test would be needed.  Otherwise in the absence of symptoms, there would be no reason for him not to go through with his planned hyperbaric treatment.  Recent  Hospitalizations:  N/A since last visit  Reviewed  CV studies:    The following studies were reviewed today: (if available, images/films reviewed: From Epic Chart or Care Everywhere) TTE 12/18/2021: Poor windows, but relatively normal study.  Normal LV size and function with EF 55 to 60%.  No RWMA.  Mild LVH.  GR 1 DD.  Normal RV size and function.  Normal RAP (unable to assess RVP.)  Normal MV.  AOV sclerosis with no stenosis.  Interval History:   KONSTANTINE GERVASI presents here today for to discuss results of his echocardiogram.  No real change in symptoms since I last saw him.  He continues to mention the fact that he does not eat well and, and is constantly fatigued and tired.  He also is pretty adamant about the fact that he does not want to have anymore surgery.  Obviously, he has not been as active since his surgery, but leading up to it he denied any exertional chest pain pressure or dyspnea.  Here today he denies any resting exertional chest pain or dyspnea.  No sensation of rapid irregular heartbeats palpitations.  No syncope or near syncope.  No TIA or amaurosis fugax.  Only symptom he notes this exercise intolerance and fatigue as well as protein malnutrition with continued weight loss.  Does not sleep well mostly because of discomfort.  No real insomnia wake up to urinate and because of discomfort.  He does not really have any heart failure symptoms-no PND, orthopnea or edema.  CV Review of Symptoms (Summary) Cardiovascular ROS: no chest pain or dyspnea on exertion positive for - dyspnea on exertion, shortness of breath, and really more fatigued than dyspnea.  Exertional fatigue with some dyspnea; sometimes gets dizzy with certain movements or with bending over.. negative for - chest pain, edema, irregular heartbeat, orthopnea, palpitations, paroxysmal nocturnal dyspnea, rapid heart rate, or  syncope/near syncope or TIA/amaurosis fugax, no claudication.  No rapid irregular heartbeats  and palpitations.  REVIEWED OF SYSTEMS   Review of Systems  Constitutional:  Positive for malaise/fatigue (Gets worn out doing just about anything.  Exercise intolerance/fatigue) and weight loss (Still not eating well.).  HENT:  Negative for congestion and nosebleeds.   Eyes:  Negative for blurred vision.  Respiratory:  Positive for shortness of breath. Negative for cough and hemoptysis.   Cardiovascular:  Positive for leg swelling (Trivial). Negative for chest pain and claudication.  Gastrointestinal:  Negative for blood in stool and constipation.  Genitourinary:  Negative for hematuria.  Musculoskeletal:  Positive for joint pain (Knees and hips). Negative for back pain and falls.  Neurological:  Positive for dizziness and weakness (Generalized). Negative for sensory change, focal weakness and headaches.  Endo/Heme/Allergies:  Does not bruise/bleed easily.  Psychiatric/Behavioral:  Positive for memory loss. Negative for depression, substance abuse and suicidal ideas. The patient is nervous/anxious. The patient does not have insomnia (Mostly because of nocturia).     I have reviewed and (if needed) personally updated the patient's problem list, medications, allergies, past medical and surgical history, social and family history.   PAST MEDICAL HISTORY   Past Medical History:  Diagnosis Date   Arthritis    "generalized" (03/01/2013)   CAD (coronary artery disease)    Cataract    GERD (gastroesophageal reflux disease)    HOH (hard of hearing)    Hyperlipidemia    Myocardial infarction ([place]) 06/16/2002   PONV (postoperative nausea and vomiting)    Type II diabetes mellitus ([place])    Unspecified essential hypertension    10/11/2021 Abdominal Aortogram with Left Lower Extremity Runoff (Dr. Stanford Breed): Terminal aortoiliac arteries: Right hypogastric artery occluded.  LLE: Widely patent CFA, PFA.  SFA is heavily diseased with Multifocal Chronic Total Occlusion, Above-Knee Pop A- heavily  disease above the knee and behind the knee with multifocal critical stenosis-Below-Knee Pop A is without any significant disease.  AT trunk-dominant tibial vessels of the foot.  TP trunk patent but occludes distally.  Peroneal artery free consistent distal leg.  PT occluded pedal circulation fills via AT. => Plan is for vein mapping to determine conduit for femoropopliteal bypass, likely require PTFE. 10/24/2021 left CFA-BK Pop A (femoropopliteal) bypas -using 6 mm ePTFE graft.  PAST SURGICAL HISTORY   Past Surgical History:  Procedure Laterality Date   ABDOMINAL AORTOGRAM W/LOWER EXTREMITY N/A 10/11/2021   Procedure: ABDOMINAL AORTOGRAM W/LOWER EXTREMITY;  Surgeon: [name], MD;  Location: [place] [facility];  Service: Cardiovascular;  Laterality: N/A;  Left Lower Extremity   BACK SURGERY  1970s   CATARACT EXTRACTION W/ INTRAOCULAR LENS IMPLANT Left 06/17/2011   CHOLECYSTECTOMY N/A 03/02/2013   Procedure: LAPAROSCOPIC CHOLECYSTECTOMY;  Surgeon: Rolm Bookbinder, MD;  Location: [place];  Service: General;  Laterality: N/A;   CORONARY ANGIOPLASTY WITH STENT PLACEMENT     "had 2-3; they clogged; he later had OHS" (03/01/2013)   CORONARY ARTERY BYPASS GRAFT  02/15/2003   "triple" (03/01/2013)   FEMORAL-POPLITEAL BYPASS GRAFT Left 10/24/2021   Procedure: BYPASS LEFT COMMON FEMORAL-BELOW KNEE POPLITEAL ARTERY WITH 6MM BY 80CM RING GRAFT;  Surgeon: [name], MD;  Location: [facility];  Service: Vascular;  Laterality: Left;   TRANSTHORACIC ECHOCARDIOGRAM  12/18/2021   Poor windows, but relatively normal study.  Normal LV size and function with EF 55 to 60%.  No RWMA.  Mild LVH.  GR 1 DD.  Normal RV size and function.  Normal RAP (unable to assess RVP.)  Normal MV.  AOV sclerosis with no stenosis.    Immunization History  Administered Date(s) Administered   DTP 11/23/2004   Influenza Split 03/20/2009, 02/19/2011, 03/21/2012, 04/07/2013, 03/14/2014, 03/23/2015, 02/18/2016, 02/22/2018,  03/02/2020, 03/05/2021   Influenza-Unspecified 03/20/2017, 03/19/2019   PFIZER(Purple Top)SARS-COV-2 Vaccination 07/31/2019, 08/23/2019, 01/08/2021   Pneumococcal Conjugate-13 06/29/2014   Pneumococcal Polysaccharide-23 02/26/2001, 12/05/2009    MEDICATIONS/ALLERGIES   Current Meds  Medication Sig   ACCU-CHEK AVIVA PLUS test strip    Accu-Chek Softclix Lancets lancets SMARTSIG:Topical   amLODipine (NORVASC) 5 MG tablet Take 5 mg by mouth daily.   aspirin EC 81 MG tablet Take 81 mg by mouth daily.   Cholecalciferol (VITAMIN D3) 50 MCG (2000 UT) TABS Take 2,000 Units by mouth daily.   dorzolamide-timolol (COSOPT) 22.3-6.8 MG/ML ophthalmic solution 1 drop 2 (two) times daily.   gabapentin (NEURONTIN) 100 MG capsule Take 100 mg by mouth 3 (three) times daily.   latanoprost (XALATAN) 0.005 % ophthalmic solution Place 1 drop into both eyes at bedtime.   lidocaine (XYLOCAINE) 2 % jelly Apply 1 application. topically 3 (three) times daily as needed.   lidocaine (XYLOCAINE) 5 % ointment 1 application. daily as needed for mild pain or moderate pain.   lisinopril-hydrochlorothiazide (ZESTORETIC) 20-25 MG tablet Take 1 tablet by mouth daily.   metFORMIN (GLUCOPHAGE) 500 MG tablet Take 500 mg by mouth 2 (two) times daily with a meal.   pantoprazole (PROTONIX) 40 MG tablet Take 40 mg by mouth daily.   SANTYL 250 UNIT/GM ointment Apply topically daily.   simvastatin (ZOCOR) 80 MG tablet Take 40 mg by mouth at bedtime.    No Known Allergies  SOCIAL HISTORY/FAMILY HISTORY   Reviewed in Epic:   Social History   Tobacco Use   Smoking status: Former    Years: 30.00    Types: Cigarettes   Smokeless tobacco: Never  Vaping Use   Vaping Use: Never used  Substance Use Topics   Alcohol use: Not Currently    Comment: 03/01/2013 "drank years ago; never had problem w/it"   Drug use: No   Social History   Social History Narrative   Not on file   Family History  Problem Relation Age of Onset    Heart disease Mother    Diabetes Mother     OBJCTIVE -PE, EKG, labs   Wt Readings from Last 3 Encounters:  12/20/21 125 lb 12.8 oz (57.1 kg)  12/16/21 127 lb 6.4 oz (57.8 kg)  12/11/21 130 lb 12.8 oz (59.3 kg)    Physical Exam: BP 121/61   Pulse 78   Ht '5\' 9"'$  (1.753 m)   Wt 125 lb 12.8 oz (57.1 kg)   SpO2 97%   BMI 18.58 kg/m  Physical Exam Vitals reviewed.  Constitutional:  General: He is not in acute distress.    Appearance: He is not toxic-appearing.     Comments: Linna Hoff is a very elderly gentleman but otherwise well grown.  No acute distress.  Still grouchy and cantankerous.  Very hard of hearing.  Very impatient.  HENT:     Head: Normocephalic and atraumatic.     Ears:     Comments: Very hard of hearing Neck:     Vascular: No carotid bruit (Likely radiated AOV murmur) or JVD.  Cardiovascular:     Rate and Rhythm: Normal rate and regular rhythm. No extrasystoles are present.    Chest Wall: PMI is not displaced.     Pulses: Decreased pulses (Decreased pedal pulses but warm to touch).     Heart sounds: S1 normal and S2 normal. Heart sounds are distant. Murmur (1-2/6 SEM at RUSB-neck.) heard.     Gallop present. S4 sounds present.     Comments: Normal S1 and split S2 Pulmonary:     Effort: Pulmonary effort is normal. No respiratory distress.     Breath sounds: Normal breath sounds. No wheezing, rhonchi or rales.  Chest:     Chest wall: No tenderness.  Abdominal:     Comments: Somewhat scaphoid, thin abdomen  Musculoskeletal:        General: No swelling (Trivial). Normal range of motion.     Cervical back: Normal range of motion and neck supple.  Skin:    General: Skin is warm and dry.  Neurological:     General: No focal deficit present.     Mental Status: He is alert and oriented to person, place, and time.     Gait: Gait abnormal (Slow, steady gait, uses a walker.).  Psychiatric:        Mood and Affect: Mood normal.        Behavior: Behavior normal.         Thought Content: Thought content normal.        Judgment: Judgment normal.     Adult ECG Report Not checked  Recent Labs: Reviewed Lab Results  Component Value Date   CHOL 136 10/25/2021   HDL 55 10/25/2021   LDLCALC 71 10/25/2021   TRIG 51 10/25/2021   CHOLHDL 2.5 10/25/2021   Lab Results  Component Value Date   CREATININE 1.05 11/15/2021   BUN 20 11/15/2021   NA 143 11/15/2021   K 4.1 11/15/2021   CL 106 11/15/2021   CO2 29 11/15/2021      Latest Ref Rng & Units 10/25/2021    2:41 AM 10/24/2021    8:44 AM 10/11/2021    8:13 AM  CBC  WBC 4.0 - 10.5 K/uL 8.7  7.8    Hemoglobin 13.0 - 17.0 g/dL 10.9  10.5  12.9   Hematocrit 39.0 - 52.0 % 32.2  34.3  38.0   Platelets 150 - 400 K/uL 235  205      Lab Results  Component Value Date   HGBA1C 6.7 (H) 10/25/2021   No results found for: "TSH"  ================================================== I spent a total of 18 minutes with the patient spent in direct patient consultation.  Additional time spent with chart review  / charting (studies, outside notes, etc): 25 min Total Time: 43 min  Current medicines are reviewed at length with the patient today.  (+/- concerns) n/a  Studies Ordered:  No orders of the defined types were placed in this encounter.  No orders of the defined types were placed in  this encounter.   Patient Instructions / Medication Changes & Studies & Tests Ordered   Patient Instructions  Medication Instructions:  No changes   *If you need a refill on your cardiac medications before your next appointment, please call your pharmacy*   Lab Work: Not needed    Testing/Procedures: Not needed   Follow-Up: At [facility], you and your health needs are our priority.  As part of our continuing mission to provide you with exceptional heart care, we have created designated Provider Care Teams.  These Care Teams include your primary Cardiologist (physician) and Advanced Practice Providers (APPs -   Physician Assistants and Nurse Practitioners) who all work together to provide you with the care you need, when you need it.     Your next appointment:   6 month(s)  Jan  2024  The format for your next appointment:   In Person  Provider:   Glenetta Hew, MD    Other Instructions   Eat anything  you want    Okay  you are  cleared  for hyperbaric therapy from cardiac standpoint , no further cardiac evaluation needed    Glenetta Hew, M.D., M.S. Interventional Cardiologist   Pager # [phone] Phone # [phone] [address]. [place],  [address]   Thank you for choosing Heartcare at [facility]!!

## 2021-12-23 ENCOUNTER — Encounter (HOSPITAL_BASED_OUTPATIENT_CLINIC_OR_DEPARTMENT_OTHER): Payer: Medicare Other | Attending: Internal Medicine | Admitting: Internal Medicine

## 2021-12-23 DIAGNOSIS — I252 Old myocardial infarction: Secondary | ICD-10-CM | POA: Insufficient documentation

## 2021-12-23 DIAGNOSIS — M86672 Other chronic osteomyelitis, left ankle and foot: Secondary | ICD-10-CM | POA: Diagnosis not present

## 2021-12-23 DIAGNOSIS — I2510 Atherosclerotic heart disease of native coronary artery without angina pectoris: Secondary | ICD-10-CM | POA: Insufficient documentation

## 2021-12-23 DIAGNOSIS — L97508 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 with other specified severity: Secondary | ICD-10-CM | POA: Diagnosis not present

## 2021-12-23 DIAGNOSIS — G8929 Other chronic pain: Secondary | ICD-10-CM | POA: Diagnosis not present

## 2021-12-23 DIAGNOSIS — E11621 Type 2 diabetes mellitus with foot ulcer: Secondary | ICD-10-CM | POA: Insufficient documentation

## 2021-12-23 DIAGNOSIS — E1151 Type 2 diabetes mellitus with diabetic peripheral angiopathy without gangrene: Secondary | ICD-10-CM | POA: Diagnosis not present

## 2021-12-23 DIAGNOSIS — I70245 Atherosclerosis of native arteries of left leg with ulceration of other part of foot: Secondary | ICD-10-CM | POA: Diagnosis not present

## 2021-12-23 LAB — GLUCOSE, CAPILLARY
Glucose-Capillary: 145 mg/dL — ABNORMAL HIGH (ref 70–99)
Glucose-Capillary: 119 mg/dL — ABNORMAL HIGH (ref 70–99)

## 2021-12-23 NOTE — Progress Notes (Addendum)
KANE, KUSEK ([id number]) Visit Report for 12/23/2021 Arrival Information Details Patient Name: Date of Service: [place], [place]. 12/23/2021 8:00 A M Medical Record Number: [id number] Patient Account Number: [id number] Date of Birth/Sex: Treating RN: Sep 17, 1931 (88 y.o. Harold [name] Primary Care Montrice Montuori: [name] Other Clinician: Donavan Burnet Referring Burhan Barham: Treating Mckell Riecke/Extender: Darl Pikes Weeks in Treatment: 7 Visit Information History Since Last Visit All ordered tests and consults were completed: Yes Patient Arrived: Ambulatory Added or deleted any medications: No Arrival Time: 07:29 Any new allergies or adverse reactions: No Accompanied By: Daughter Had a fall or experienced change in No Transfer Assistance: None activities of daily living that may affect Patient Requires Transmission-Based Precautions: No risk of falls: Patient Has Alerts: Yes Signs or symptoms of abuse/neglect since last visito No Patient Alerts: R ABI [place] R TBI: 0.53 Hospitalized since last visit: No L ABI [place] L TBI: 0.30 Implantable device outside of the clinic excluding No cellular tissue based products placed in the center since last visit: Pain Present Now: No Electronic Signature(s) Signed: 12/23/2021 11:54:20 AM By: Donavan Burnet CHT EMT BS , , Previous Signature: 12/23/2021 11:46:48 AM Version By: Donavan Burnet CHT EMT BS , , Entered By: Donavan Burnet on 12/23/2021 11:54:19 -------------------------------------------------------------------------------- Encounter Discharge Information Details Patient Name: Date of Service: Harold [name], [place] [name] 12/23/2021 8:00 A M Medical Record Number: [id number] Patient Account Number: [id number] Date of Birth/Sex: Treating RN: 10/08/1931 (88 y.o. Harold [name] Primary Care Deronda Christian: [name] Other Clinician: Donavan Burnet Referring  Savaya Hakes: Treating Ashely Goosby/Extender: Adline Mango in Treatment: 7 Encounter Discharge Information Items Discharge Condition: Stable Ambulatory Status: Ambulatory Discharge Destination: Home Transportation: Private Auto Accompanied By: Daughter Schedule Follow-up Appointment: No Clinical Summary of Care: Electronic Signature(s) Signed: 12/23/2021 11:54:02 AM By: Donavan Burnet CHT EMT BS , , Entered By: Donavan Burnet on 12/23/2021 11:54:02 -------------------------------------------------------------------------------- Vitals Details Patient Name: Date of Service: Harold [name], [name] 12/23/2021 8:00 A M Medical Record Number: [id number] Patient Account Number: [id number] Date of Birth/Sex: Treating RN: [birth year] (88 y.o. Harold [name] Primary Care Britten Parady: [name] Other Clinician: Donavan Burnet Referring Gilbert Manolis: Treating Roan Miklos/Extender: Darl Pikes Weeks in Treatment: 7 Vital Signs Time Taken: 07:41 Temperature (F): 97.5 Height (in): 71 Pulse (bpm): 70 Weight (lbs): 135 Respiratory Rate (breaths/min): 18 Body Mass Index (BMI): 18.8 Blood Pressure (mmHg): 120/58 Capillary Blood Glucose (mg/dl): 145 Reference Range: 80 - 120 mg / dl Electronic Signature(s) Signed: 12/23/2021 11:47:33 AM By: Donavan Burnet CHT EMT BS , , Entered By: Donavan Burnet on 12/23/2021 11:47:33

## 2021-12-23 NOTE — Progress Notes (Addendum)
Harold [name], Harold [name] ([id number]) Visit Report for 12/23/2021 HBO Details Patient Name: Date of Service: [place] 12/23/2021 8:00 A M Medical Record Number: [id number] Patient Account Number: [id number] Date of Birth/Sex: Treating RN: [birth year] (88 y.o. [name] Primary Care Rhondalyn Clingan: [name] Other Clinician: Donavan Burnet Referring Wadell Craddock: Treating Haitham Dolinsky/Extender: Darl Pikes Weeks in Treatment: 7 HBO Treatment Course Details Treatment Course Number: 1 Ordering Danene Montijo: Kalman Shan T Treatments Ordered: otal 40 HBO Treatment Start Date: 12/23/2021 HBO Indication: Diabetic Ulcer(s) of the Lower Extremity Standard/Conservative Wound Care tried and failed greater than or equal to 30 days Wound #1 Left, Medial Foot HBO Treatment Details Treatment Number: 1 Patient Type: Outpatient Chamber Type: Monoplace Chamber Serial #: [id number] Treatment Protocol: 2.0 ATA with 90 minutes oxygen, with two 5 minute air breaks Treatment Details Compression Rate Down: 1.0 psi / minute De-Compression Rate Up: 1.0 psi / minute A breaks and breathing ir Compress Tx Pressure periods Decompress Decompress Begins Reached (leave unused spaces Begins Ends blank) Chamber Pressure (ATA '1 2 2 2 2 2 '$ --2 1 ) Clock Time (24 hr) 08:11 08:34 09:04 09:09 09:39 09:44 - - 10:14 10:31 Treatment Length: 140 (minutes) Treatment Segments: 5 Vital Signs Capillary Blood Glucose Reference Range: 80 - 120 mg / dl HBO Diabetic Blood Glucose Intervention Range: <131 mg/dl or >249 mg/dl Type: Time Vitals Blood Respiratory Capillary Blood Glucose Pulse Action Pulse: Temperature: Taken: Pressure: Rate: Glucose (mg/dl): Meter #: Oximetry (%) Taken: Pre 07:41 120/58 70 18 97.5 145 none per protocol Post 10:34 118/53 58 18 97.3 119 none per protocol Treatment Response Treatment Toleration: Well Treatment Completion Status: Treatment  Completed without Adverse Event Treatment Notes Patient safely placed in chamber after performing safety checklist. Patient travelled at rate set of 1 psi/min with purge rate set on highest setting with an average speed of 0.7 psi/min for compression of chamber. Patient instructed on ear equalization techniques. At approximately 2 psi chamber pressure (psig) patient reported that his ears were equalizing as they should. At approximately 4 psig, patient was unable to hear conversation through speakers in the chamber. Pressure was reversed back to 0 psig. Patient reported he could hear as clearly as he normally does at about 2 psig. Patient was instructed to listen to TV and when he couldn't hear it anymore to swallow or other equalization technique. Patient travelled to treatment depth without further incident. At the end of the treatment, chamber was decompressed at the same rate and patient reported no problems with ears nor any other problems. Additional Procedure Documentation Tissue Sevierity: Fat layer exposed Physician HBO Attestation: I certify that I supervised this HBO treatment in accordance with Medicare guidelines. A trained emergency response team is readily available per Yes hospital policies and procedures. Continue HBOT as ordered. Yes Electronic Signature(s) Signed: 12/24/2021 11:15:02 AM By: Donavan Burnet CHT EMT BS , , Signed: 12/25/2021 11:30:51 AM By: Kalman Shan DO Previous Signature: 12/23/2021 4:25:55 PM Version By: Kalman Shan DO Previous Signature: 12/23/2021 11:52:25 AM Version By: Donavan Burnet CHT EMT BS , , Entered By: Donavan Burnet on 12/24/2021 11:15:01 -------------------------------------------------------------------------------- HBO Safety Checklist Details Patient Name: Date of Service: Harold [name], [place]. 12/23/2021 8:00 A M Medical Record Number: [id number] Patient Account Number: [id number] Date of Birth/Sex: Treating  RN: 1932-01-13 (88 y.o. [name] Primary Care Selah Klang: [name] Other Clinician: Donavan Burnet Referring Mitchelle Sultan: Treating Terita Hejl/Extender: Darl Pikes Weeks in Treatment:  7 HBO Safety Checklist Items Safety Checklist Consent Form Signed Patient voided / foley secured and emptied When did you last eato 0445 Last dose of injectable or oral agent 0445 Ostomy pouch emptied and vented if applicable NA All implantable devices assessed, documented and approved NA Intravenous access site secured and place NA Valuables secured Linens and cotton and cotton/polyester blend (less than 51% polyester) Personal oil-based products / skin lotions / body lotions removed Wigs or hairpieces removed NA Smoking or tobacco materials removed NA Books / newspapers / magazines / loose paper removed Cologne, aftershave, perfume and deodorant removed Jewelry removed (may wrap wedding band) Make-up removed NA Hair care products removed Battery operated devices (external) removed Heating patches and chemical warmers removed Titanium eyewear removed NA Nail polish cured greater than 10 hours NA Casting material cured greater than 10 hours NA Hearing aids removed doesn't wear his hearing aids Loose dentures or partials removed Prosthetics have been removed NA Patient demonstrates correct use of air break device (if applicable) Patient concerns have been addressed Patient grounding bracelet on and cord attached to chamber Specifics for Inpatients (complete in addition to above) Medication sheet sent with patient NA Intravenous medications needed or due during therapy sent with patient NA Drainage tubes (e.g. nasogastric tube or chest tube secured and vented) NA Endotracheal or Tracheotomy tube secured NA Cuff deflated of air and inflated with saline NA Airway suctioned NA Notes Paper version used prior to treatment. Electronic  Signature(s) Signed: 12/23/2021 11:49:00 AM By: Donavan Burnet CHT EMT BS , , Entered By: Donavan Burnet on 12/23/2021 11:48:59

## 2021-12-23 NOTE — Progress Notes (Unsigned)
[facility] [place]  ASSESSMENT / PLAN: Harold [name] is a 88 y.o. male with atherosclerosis of native arteries of left lower extremity status post left common femoral to below knee popliteal bypass with PTFE 10/24/21.  Recommend the following which can slow the progression of atherosclerosis and reduce the risk of major adverse cardiac / limb events:  Complete cessation from all tobacco products. Blood glucose control with goal A1c < 7%. Blood pressure control with goal blood pressure < 140/90 mmHg. Lipid reduction therapy with goal LDL-C <100 mg/dL (<70 if symptomatic from PAD).  Aspirin '81mg'$  PO QD.  Atorvastatin 40-'80mg'$  PO QD (or other "high intensity" statin therapy).  He has a fluid collection below the popliteal incision which seems most consistent with a seroma.  He has some mild post bypass swelling.  He has evidence of osteomyelitis in the left first distal metatarsal.  I counseled him that her amputation of the first toe would be the most definitive therapy for osteomyelitis.  He is reluctant to do this at this time.  I encouraged him to talk to his care team about this to help him make a decision.  In the meantime I agree with continued antibiotic therapy and wound care.  We will see him again in 1 month.  CHIEF COMPLAINT: left foot ulceration  HISTORY OF PRESENT ILLNESS: Harold [name] is a 88 y.o. male who presents to clinic for evaluation of left foot ulcer.  This is been present for many weeks.  He has been under the care of Dr. Cannon Kettle, has been doing meticulous wound care.  An ankle-brachial index was ordered, and suggestive of peripheral arterial disease.  He was referred for further evaluation.  The patient is a spry, and energetic 88 year old looking much younger than his stated age.  He is reportedly quite active per his daughter who is in the room with him.  His daughter is a Marine scientist in the neonatal unit at [employer].  11/26/21: Patient returns  for postoperative visit.  He underwent left common femoral to below-knee popliteal artery bypass on 10/24/2021.  He tolerated this well.  He returns to clinic for reevaluation.  Thankfully his left foot feels better.  The ulcer about his left foot appears to be healing.  Unfortunately recent x-ray showed some evidence of osteomyelitis about the first metatarsal head.  He is also developed some focal swelling about the popliteal incision.  Past Medical History:  Diagnosis Date   Arthritis    "generalized" (03/01/2013)   CAD (coronary artery disease)    Cataract    GERD (gastroesophageal reflux disease)    HOH (hard of hearing)    Hyperlipidemia    Myocardial infarction ([place]) 06/16/2002   PONV (postoperative nausea and vomiting)    Type II diabetes mellitus ([place])    Unspecified essential hypertension     Past Surgical History:  Procedure Laterality Date   ABDOMINAL AORTOGRAM W/LOWER EXTREMITY N/A 10/11/2021   Procedure: ABDOMINAL AORTOGRAM W/LOWER EXTREMITY;  Surgeon: [name], MD;  Location: [facility];  Service: Cardiovascular;  Laterality: N/A;  Left Lower Extremity   BACK SURGERY  1970s   CATARACT EXTRACTION W/ INTRAOCULAR LENS IMPLANT Left 06/17/2011   CHOLECYSTECTOMY N/A 03/02/2013   Procedure: LAPAROSCOPIC CHOLECYSTECTOMY;  Surgeon: Rolm Bookbinder, MD;  Location: [facility];  Service: General;  Laterality: N/A;   CORONARY ANGIOPLASTY WITH STENT PLACEMENT     "had 2-3; they clogged; he later had OHS" (03/01/2013)   CORONARY ARTERY BYPASS  GRAFT  02/15/2003   "triple" (03/01/2013)   FEMORAL-POPLITEAL BYPASS GRAFT Left 10/24/2021   Procedure: BYPASS LEFT COMMON FEMORAL-BELOW KNEE POPLITEAL ARTERY WITH 6MM BY 80CM RING GRAFT;  Surgeon: [name], MD;  Location: [facility];  Service: Vascular;  Laterality: Left;   TRANSTHORACIC ECHOCARDIOGRAM  12/18/2021   Poor windows, but relatively normal study.  Normal LV size and function with EF 55 to 60%.  No RWMA.  Mild LVH.  GR 1  DD.  Normal RV size and function.  Normal RAP (unable to assess RVP.)  Normal MV.  AOV sclerosis with no stenosis.    Family History  Problem Relation Age of Onset   Heart disease Mother    Diabetes Mother     Social History   Socioeconomic History   Marital status: Single    Spouse name: Not on file   Number of children: Not on file   Years of education: Not on file   Highest education level: Not on file  Occupational History   Not on file  Tobacco Use   Smoking status: Former    Years: 30.00    Types: Cigarettes   Smokeless tobacco: Never  Vaping Use   Vaping Use: Never used  Substance and Sexual Activity   Alcohol use: Not Currently    Comment: 03/01/2013 "drank years ago; never had problem w/it"   Drug use: No   Sexual activity: Never  Other Topics Concern   Not on file  Social History Narrative   Not on file   Social Determinants of Health   Financial Resource Strain: Not on file  Food Insecurity: Not on file  Transportation Needs: Not on file  Physical Activity: Not on file  Stress: Not on file  Social Connections: Not on file  Intimate Partner Violence: Not on file    No Known Allergies  Current Outpatient Medications  Medication Sig Dispense Refill   ACCU-CHEK AVIVA PLUS test strip      Accu-Chek Softclix Lancets lancets SMARTSIG:Topical     amLODipine (NORVASC) 5 MG tablet Take 5 mg by mouth daily.     aspirin EC 81 MG tablet Take 81 mg by mouth daily.     Cholecalciferol (VITAMIN D3) 50 MCG (2000 UT) TABS Take 2,000 Units by mouth daily.     dorzolamide-timolol (COSOPT) 22.3-6.8 MG/ML ophthalmic solution 1 drop 2 (two) times daily.     gabapentin (NEURONTIN) 100 MG capsule Take 100 mg by mouth 3 (three) times daily.     latanoprost (XALATAN) 0.005 % ophthalmic solution Place 1 drop into both eyes at bedtime.     lidocaine (XYLOCAINE) 2 % jelly Apply 1 application. topically 3 (three) times daily as needed.     lidocaine (XYLOCAINE) 5 % ointment 1  application. daily as needed for mild pain or moderate pain.     lisinopril-hydrochlorothiazide (ZESTORETIC) 20-25 MG tablet Take 1 tablet by mouth daily.     metFORMIN (GLUCOPHAGE) 500 MG tablet Take 500 mg by mouth 2 (two) times daily with a meal.     pantoprazole (PROTONIX) 40 MG tablet Take 40 mg by mouth daily.     SANTYL 250 UNIT/GM ointment Apply topically daily.     simvastatin (ZOCOR) 80 MG tablet Take 40 mg by mouth at bedtime.     No current facility-administered medications for this visit.    PHYSICAL EXAM There were no vitals filed for this visit.    Constitutional: well appearing elderly gentleman. no distress. Appears well nourished.  Cardiac: regular rate and rhythm.  Respiratory:  unlabored. Abdominal:  soft, non-tender, non-distended.  Peripheral vascular: Brisk Doppler flow in the foot.  Left foot ulcer appears slightly improved with a healing edge.  Ballotable fluid collection about the left popliteal incision which is not inflamed, indurated, tender, or draining.  PERTINENT LABORATORY AND RADIOLOGIC DATA  Most recent CBC    Latest Ref Rng & Units 10/25/2021    2:41 AM 10/24/2021    8:44 AM 10/11/2021    8:13 AM  CBC  WBC 4.0 - 10.5 K/uL 8.7  7.8    Hemoglobin 13.0 - 17.0 g/dL 10.9  10.5  12.9   Hematocrit 39.0 - 52.0 % 32.2  34.3  38.0   Platelets 150 - 400 K/uL 235  205       Most recent CMP    Latest Ref Rng & Units 11/15/2021    9:34 AM 10/25/2021    2:41 AM 10/24/2021    8:44 AM  CMP  Glucose 65 - 99 mg/dL 206  144  94   BUN 7 - 25 mg/dL '20  16  17   '$ Creatinine 0.70 - 1.22 mg/dL 1.05  0.98  0.83   Sodium 135 - 146 mmol/L 143  135  137   Potassium 3.5 - 5.3 mmol/L 4.1  3.9  4.0   Chloride 98 - 110 mmol/L 106  105  108   CO2 20 - 32 mmol/L '29  22  17   '$ Calcium 8.6 - 10.3 mg/dL 9.6  8.3  8.4   Total Protein 6.5 - 8.1 g/dL   5.2   Total Bilirubin 0.3 - 1.2 mg/dL   0.7   Alkaline Phos 38 - 126 U/L   32   AST 15 - 41 U/L   17   ALT 0 - 44 U/L   9     Vascular Imaging:   +-------+-----------+-----------+------------+------------+  ABI/TBIToday's ABIToday's TBIPrevious ABIPrevious TBI  +-------+-----------+-----------+------------+------------+  Right  [place]         0.56       [place]          0.53          +-------+-----------+-----------+------------+------------+  Left   [place]         0.64       [place]          0.30          +-------+-----------+-----------+------------+------------+   Yevonne Aline. Stanford Breed, MD [facility] Phone Number: [phone] 12/23/2021 3:22 PM  Portions of this report may have been transcribed using voice recognition software.  Every effort has been made to ensure accuracy; however, inadvertent computerized transcription errors may still be present.

## 2021-12-23 NOTE — Progress Notes (Signed)
[name], [name] ([id number]) Visit Report for 12/23/2021 SuperBill Details Patient Name: Date of Service: [name] 12/23/2021 Medical Record Number: [id number] Patient Account Number: [id number] Date of Birth/Sex: Treating RN: 01-17-1932 (88 y.o. [name] Primary Care Provider: [name] Other Clinician: Donavan Burnet Referring Provider: Treating Provider/Extender: Darl Pikes Weeks in Treatment: 7 Diagnosis Coding ICD-10 Codes Code Description [phone] Type 2 diabetes mellitus with foot ulcer L97.508 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 with other specified severity M86.672 Other chronic osteomyelitis, left ankle and foot I70.245 Atherosclerosis of native arteries of left leg with ulceration of other part of foot Facility Procedures CPT4 Code Description Modifier Quantity [id number]-(Facility Use Only) HBOT full body chamber, 51mn , 5 ICD-10 Diagnosis Description E11.621 Type 2 diabetes mellitus with foot ulcer L97.508 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 with other specified severity M86.672 Other chronic osteomyelitis, left ankle and foot I70.245 Atherosclerosis of native arteries of left leg with ulceration of other part of foot Physician Procedures Quantity CPT4 Code Description Modifier [id number] - WC PHYS HYPERBARIC OXYGEN THERAPY 1 ICD-10 Diagnosis Description E11.621 Type 2 diabetes mellitus with foot ulcer L97.508 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 with other specified severity M86.672 Other chronic osteomyelitis, left ankle and foot I70.245 Atherosclerosis of native arteries of left leg with ulceration of other part of foot Electronic Signature(s) Signed: 12/23/2021 11:53:35 AM By: SDonavan BurnetCHT EMT BS , , Signed: 12/23/2021 4:25:55 PM By: HKalman ShanDO Entered By: SDonavan Burneton 12/23/2021  11:53:35

## 2021-12-23 NOTE — H&P (View-Only) (Signed)
[facility]  ASSESSMENT / PLAN: Harold [name] is a 88 y.o. male with atherosclerosis of native arteries of left lower extremity status post left common femoral to below knee popliteal bypass with PTFE 10/24/21.  Recommend the following which can slow the progression of atherosclerosis and reduce the risk of major adverse cardiac / limb events:  Complete cessation from all tobacco products. Blood glucose control with goal A1c < 7%. Blood pressure control with goal blood pressure < 140/90 mmHg. Lipid reduction therapy with goal LDL-C <100 mg/dL (<70 if symptomatic from PAD).  Aspirin '81mg'$  PO QD.  Atorvastatin 40-'80mg'$  PO QD (or other "high intensity" statin therapy).  He has a fluid collection below the popliteal incision which seems most consistent with a seroma.  He has some mild post bypass swelling.  He has evidence of osteomyelitis in the left first distal metatarsal.  I counseled him that her amputation of the first toe would be the most definitive therapy for osteomyelitis.  He continues to refuse this approach. In the meantime I agree with continued antibiotic therapy and wound care.  We will see him again in 3 months.  CHIEF COMPLAINT: left foot ulceration  HISTORY OF PRESENT ILLNESS: Harold [name] is a 88 y.o. male who presents to clinic for evaluation of left foot ulcer.  This is been present for many weeks.  He has been under the care of Dr. Cannon Kettle, has been doing meticulous wound care.  An ankle-brachial index was ordered, and suggestive of peripheral arterial disease.  He was referred for further evaluation.  The patient is a spry, and energetic 88 year old looking much younger than his stated age.  He is reportedly quite active per his daughter who is in the room with him.  His daughter is a Marine scientist in the neonatal unit at [employer].  11/26/21: Patient returns for postoperative visit.  He underwent left common femoral to below-knee popliteal  artery bypass on 10/24/2021.  He tolerated this well.  He returns to clinic for reevaluation.  Thankfully his left foot feels better.  The ulcer about his left foot appears to be healing.  Unfortunately recent x-ray showed some evidence of osteomyelitis about the first metatarsal head.  He is also developed some focal swelling about the popliteal incision.  12/24/21: Patient returns for follow-up.  He thinks his foot is doing better.  He continues to get IV antibiotics and local wound care at the wound care center.  He continues to refuse great toe ray amputation.  Past Medical History:  Diagnosis Date   Arthritis    "generalized" (03/01/2013)   CAD (coronary artery disease)    Cataract    GERD (gastroesophageal reflux disease)    HOH (hard of hearing)    Hyperlipidemia    Myocardial infarction ([place]) 06/16/2002   PONV (postoperative nausea and vomiting)    Type II diabetes mellitus ([place])    Unspecified essential hypertension     Past Surgical History:  Procedure Laterality Date   ABDOMINAL AORTOGRAM W/LOWER EXTREMITY N/A 10/11/2021   Procedure: ABDOMINAL AORTOGRAM W/LOWER EXTREMITY;  Surgeon: [name], MD;  Location: [facility];  Service: Cardiovascular;  Laterality: N/A;  Left Lower Extremity   BACK SURGERY  1970s   CATARACT EXTRACTION W/ INTRAOCULAR LENS IMPLANT Left 06/17/2011   CHOLECYSTECTOMY N/A 03/02/2013   Procedure: LAPAROSCOPIC CHOLECYSTECTOMY;  Surgeon: Rolm Bookbinder, MD;  Location: [facility];  Service: General;  Laterality: N/A;   CORONARY ANGIOPLASTY WITH STENT PLACEMENT     "  had 2-3; they clogged; he later had OHS" (03/01/2013)   CORONARY ARTERY BYPASS GRAFT  02/15/2003   "triple" (03/01/2013)   FEMORAL-POPLITEAL BYPASS GRAFT Left 10/24/2021   Procedure: BYPASS LEFT COMMON FEMORAL-BELOW KNEE POPLITEAL ARTERY WITH 6MM BY 80CM RING GRAFT;  Surgeon: [name], MD;  Location: [facility];  Service: Vascular;  Laterality: Left;   TRANSTHORACIC ECHOCARDIOGRAM   12/18/2021   Poor windows, but relatively normal study.  Normal LV size and function with EF 55 to 60%.  No RWMA.  Mild LVH.  GR 1 DD.  Normal RV size and function.  Normal RAP (unable to assess RVP.)  Normal MV.  AOV sclerosis with no stenosis.    Family History  Problem Relation Age of Onset   Heart disease Mother    Diabetes Mother     Social History   Socioeconomic History   Marital status: Single    Spouse name: Not on file   Number of children: Not on file   Years of education: Not on file   Highest education level: Not on file  Occupational History   Not on file  Tobacco Use   Smoking status: Former    Years: 30.00    Types: Cigarettes   Smokeless tobacco: Never  Vaping Use   Vaping Use: Never used  Substance and Sexual Activity   Alcohol use: Not Currently    Comment: 03/01/2013 "drank years ago; never had problem w/it"   Drug use: No   Sexual activity: Never  Other Topics Concern   Not on file  Social History Narrative   Not on file   Social Determinants of Health   Financial Resource Strain: Not on file  Food Insecurity: Not on file  Transportation Needs: Not on file  Physical Activity: Not on file  Stress: Not on file  Social Connections: Not on file  Intimate Partner Violence: Not on file    No Known Allergies  Current Outpatient Medications  Medication Sig Dispense Refill   ACCU-CHEK AVIVA PLUS test strip      Accu-Chek Softclix Lancets lancets SMARTSIG:Topical     amLODipine (NORVASC) 5 MG tablet Take 5 mg by mouth daily.     aspirin EC 81 MG tablet Take 81 mg by mouth daily.     Cholecalciferol (VITAMIN D3) 50 MCG (2000 UT) TABS Take 2,000 Units by mouth daily.     dorzolamide-timolol (COSOPT) 22.3-6.8 MG/ML ophthalmic solution 1 drop 2 (two) times daily.     gabapentin (NEURONTIN) 100 MG capsule Take 100 mg by mouth 3 (three) times daily.     latanoprost (XALATAN) 0.005 % ophthalmic solution Place 1 drop into both eyes at bedtime.      lidocaine (XYLOCAINE) 2 % jelly Apply 1 application. topically 3 (three) times daily as needed.     lidocaine (XYLOCAINE) 5 % ointment 1 application. daily as needed for mild pain or moderate pain.     lisinopril-hydrochlorothiazide (ZESTORETIC) 20-25 MG tablet Take 1 tablet by mouth daily.     metFORMIN (GLUCOPHAGE) 500 MG tablet Take 500 mg by mouth 2 (two) times daily with a meal.     pantoprazole (PROTONIX) 40 MG tablet Take 40 mg by mouth daily.     SANTYL 250 UNIT/GM ointment Apply topically daily.     simvastatin (ZOCOR) 80 MG tablet Take 40 mg by mouth at bedtime.     No current facility-administered medications for this visit.    PHYSICAL EXAM Vitals:   12/24/21 1253  BP:  126/60  Pulse: 81  Resp: 20  Temp: 97.7 F (36.5 C)  SpO2: 98%  Weight: 125 lb (56.7 kg)  Height: '5\' 9"'$  (1.753 m)      Constitutional: well appearing elderly gentleman. no distress. Appears well nourished.  Cardiac: regular rate and rhythm.  Respiratory:  unlabored. Abdominal:  soft, non-tender, non-distended.  Peripheral vascular: Brisk Doppler flow in the foot.  Left foot ulcer appears slightly improved with a healing edge.  Ballotable fluid collection about the left popliteal incision which is not inflamed, indurated, tender, or draining.  PERTINENT LABORATORY AND RADIOLOGIC DATA  Most recent CBC    Latest Ref Rng & Units 10/25/2021    2:41 AM 10/24/2021    8:44 AM 10/11/2021    8:13 AM  CBC  WBC 4.0 - 10.5 K/uL 8.7  7.8    Hemoglobin 13.0 - 17.0 g/dL 10.9  10.5  12.9   Hematocrit 39.0 - 52.0 % 32.2  34.3  38.0   Platelets 150 - 400 K/uL 235  205       Most recent CMP    Latest Ref Rng & Units 11/15/2021    9:34 AM 10/25/2021    2:41 AM 10/24/2021    8:44 AM  CMP  Glucose 65 - 99 mg/dL 206  144  94   BUN 7 - 25 mg/dL '20  16  17   '$ Creatinine 0.70 - 1.22 mg/dL 1.05  0.98  0.83   Sodium 135 - 146 mmol/L 143  135  137   Potassium 3.5 - 5.3 mmol/L 4.1  3.9  4.0   Chloride 98 - 110 mmol/L  106  105  108   CO2 20 - 32 mmol/L '29  22  17   '$ Calcium 8.6 - 10.3 mg/dL 9.6  8.3  8.4   Total Protein 6.5 - 8.1 g/dL   5.2   Total Bilirubin 0.3 - 1.2 mg/dL   0.7   Alkaline Phos 38 - 126 U/L   32   AST 15 - 41 U/L   17   ALT 0 - 44 U/L   9    Vascular Imaging:   +-------+-----------+-----------+------------+------------+  ABI/TBIToday's ABIToday's TBIPrevious ABIPrevious TBI  +-------+-----------+-----------+------------+------------+  Right  [place]         0.56       [place]          0.53          +-------+-----------+-----------+------------+------------+  Left   [place]         0.64       [place]          0.30          +-------+-----------+-----------+------------+------------+   Yevonne Aline. Stanford Breed, MD [facility] Phone Number: [phone] 12/23/2021 3:22 PM  Portions of this report may have been transcribed using voice recognition software.  Every effort has been made to ensure accuracy; however, inadvertent computerized transcription errors may still be present.

## 2021-12-24 ENCOUNTER — Encounter (HOSPITAL_BASED_OUTPATIENT_CLINIC_OR_DEPARTMENT_OTHER): Payer: Medicare Other | Admitting: Internal Medicine

## 2021-12-24 ENCOUNTER — Encounter: Payer: Self-pay | Admitting: Vascular Surgery

## 2021-12-24 ENCOUNTER — Other Ambulatory Visit: Payer: Self-pay

## 2021-12-24 ENCOUNTER — Ambulatory Visit (INDEPENDENT_AMBULATORY_CARE_PROVIDER_SITE_OTHER): Payer: Medicare Other | Admitting: Vascular Surgery

## 2021-12-24 ENCOUNTER — Ambulatory Visit: Payer: Medicare Other | Admitting: Cardiology

## 2021-12-24 VITALS — BP 126/60 | HR 81 | Temp 97.7°F | Resp 20 | Ht 69.0 in | Wt 125.0 lb

## 2021-12-24 DIAGNOSIS — E1151 Type 2 diabetes mellitus with diabetic peripheral angiopathy without gangrene: Secondary | ICD-10-CM | POA: Diagnosis not present

## 2021-12-24 DIAGNOSIS — M86672 Other chronic osteomyelitis, left ankle and foot: Secondary | ICD-10-CM

## 2021-12-24 DIAGNOSIS — I739 Peripheral vascular disease, unspecified: Secondary | ICD-10-CM

## 2021-12-24 DIAGNOSIS — I2510 Atherosclerotic heart disease of native coronary artery without angina pectoris: Secondary | ICD-10-CM | POA: Diagnosis not present

## 2021-12-24 DIAGNOSIS — E11621 Type 2 diabetes mellitus with foot ulcer: Secondary | ICD-10-CM

## 2021-12-24 DIAGNOSIS — I70245 Atherosclerosis of native arteries of left leg with ulceration of other part of foot: Secondary | ICD-10-CM

## 2021-12-24 DIAGNOSIS — G8929 Other chronic pain: Secondary | ICD-10-CM | POA: Diagnosis not present

## 2021-12-24 DIAGNOSIS — L97508 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 with other specified severity: Secondary | ICD-10-CM

## 2021-12-24 DIAGNOSIS — I252 Old myocardial infarction: Secondary | ICD-10-CM | POA: Diagnosis not present

## 2021-12-24 LAB — GLUCOSE, CAPILLARY
Glucose-Capillary: 110 mg/dL — ABNORMAL HIGH (ref 70–99)
Glucose-Capillary: 140 mg/dL — ABNORMAL HIGH (ref 70–99)
Glucose-Capillary: 139 mg/dL — ABNORMAL HIGH (ref 70–99)

## 2021-12-24 NOTE — Progress Notes (Addendum)
Harold [name], Harold [name] ([id number]) Visit Report for 12/24/2021 Arrival Information Details Patient Name: Date of Service: Harold [name], Harold [name]. 12/24/2021 8:00 A M Medical Record Number: [id number] Patient Account Number: [id number] Date of Birth/Sex: Treating RN: [birth year] (88 y.o. Ernestene Mention Primary Care Jini Horiuchi: [name] Other Clinician: Donavan Burnet Referring Reinette Cuneo: Treating Saiquan Hands/Extender: Darl Pikes Weeks in Treatment: 7 Visit Information History Since Last Visit All ordered tests and consults were completed: Yes Patient Arrived: Cane Added or deleted any medications: No Arrival Time: 08:00 Any new allergies or adverse reactions: No Accompanied By: daughter Had a fall or experienced change in No Transfer Assistance: None activities of daily living that may affect Patient Identification Verified: Yes risk of falls: Secondary Verification Process Completed: Yes Signs or symptoms of abuse/neglect since last visito No Patient Requires Transmission-Based Precautions: No Hospitalized since last visit: No Patient Has Alerts: Yes Implantable device outside of the clinic excluding No Patient Alerts: R ABI [place] R TBI: 0.53 cellular tissue based products placed in the center L ABI [place] L TBI: 0.30 since last visit: Pain Present Now: No Electronic Signature(s) Signed: 12/24/2021 10:58:47 AM By: Donavan Burnet CHT EMT BS , , Previous Signature: 12/24/2021 10:22:24 AM Version By: Donavan Burnet CHT EMT BS , , Entered By: Donavan Burnet on 12/24/2021 10:58:46 -------------------------------------------------------------------------------- Encounter Discharge Information Details Patient Name: Date of Service: Harold [name], Harold [name] 12/24/2021 8:00 A M Medical Record Number: [id number] Patient Account Number: [id number] Date of Birth/Sex: Treating RN: 1932/05/27 (88 y.o. Ernestene Mention Primary Care Overton Boggus: [name] Other Clinician: Donavan Burnet Referring Lavar Rosenzweig: Treating Merikay Lesniewski/Extender: Adline Mango in Treatment: 7 Encounter Discharge Information Items Discharge Condition: Stable Ambulatory Status: Cane Discharge Destination: Home Transportation: Private Auto Accompanied By: Daughter Schedule Follow-up Appointment: No Clinical Summary of Care: Electronic Signature(s) Signed: 12/24/2021 10:58:35 AM By: Donavan Burnet CHT EMT BS , , Entered By: Donavan Burnet on 12/24/2021 10:58:35 -------------------------------------------------------------------------------- Vitals Details Patient Name: Date of Service: Harold [name], [facility] [name] 12/24/2021 8:00 A M Medical Record Number: [id number] Patient Account Number: [id number] Date of Birth/Sex: Treating RN: [birth year] (88 y.o. Ernestene Mention Primary Care Elizbeth Posa: [name] Other Clinician: Donavan Burnet Referring Eudell Mcphee: Treating Sashia Campas/Extender: Darl Pikes Weeks in Treatment: 7 Vital Signs Time Taken: 07:52 Temperature (F): 97.8 Height (in): 71 Pulse (bpm): 68 Weight (lbs): 135 Respiratory Rate (breaths/min): 16 Body Mass Index (BMI): 18.8 Blood Pressure (mmHg): 125/71 Capillary Blood Glucose (mg/dl): 110 Reference Range: 80 - 120 mg / dl Electronic Signature(s) Signed: 12/24/2021 10:23:29 AM By: Donavan Burnet CHT EMT BS , , Entered By: Donavan Burnet on 12/24/2021 10:23:29

## 2021-12-24 NOTE — Progress Notes (Signed)
EDWEN, MCLESTER ([id number]) Visit Report for 12/24/2021 SuperBill Details Patient Name: Date of Service: [place]. 12/24/2021 Medical Record Number: [id number] Patient Account Number: [id number] Date of Birth/Sex: Treating RN: 1932/05/16 (88 y.o. 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 with other specified severity M86.672 Other chronic osteomyelitis, left ankle and foot I70.245 Atherosclerosis of native arteries of left leg with ulceration of other part of foot Facility Procedures CPT4 Code Description Modifier Quantity [id number]-(Facility Use Only) HBOT full body chamber, 79mn , 4 ICD-10 Diagnosis Description E11.621 Type 2 diabetes mellitus with foot ulcer L97.508 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 with other specified severity M86.672 Other chronic osteomyelitis, left ankle and foot I70.245 Atherosclerosis of native arteries of left leg with ulceration of other part of foot Physician Procedures Quantity CPT4 Code Description Modifier [id number] PHYS HYPERBARIC OXYGEN THERAPY 1 ICD-10 Diagnosis Description E11.621 Type 2 diabetes mellitus with foot ulcer L97.508 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

## 2021-12-24 NOTE — Progress Notes (Signed)
BURRELL, HODAPP ([id number]) Visit Report for 12/24/2021 Chief Complaint Document Details Patient Name: Date of Service: [place], [place]. 12/24/2021 10:15 A M Medical Record Number: [id number] Patient Account Number: [id number] Date of Birth/Sex: Treating RN: 04-12-1932 (88 y.o. Harold [name] Primary Care Provider: [name] Other Clinician: Referring Provider: Treating Provider/Extender: Darl Pikes Weeks in Treatment: 7 Information Obtained from: Patient Chief Complaint 11/04/2021; left foot wound Electronic Signature(s) Signed: 12/24/2021 2:06:16 PM By: Kalman Shan DO Entered By: Kalman Shan on 12/24/2021 14:01:12 -------------------------------------------------------------------------------- Debridement Details Patient Name: Date of Service: [place], [place]. 12/24/2021 10:15 A M Medical Record Number: [id number] Patient Account Number: [id number] Date of Birth/Sex: Treating RN: 1931-10-26 (88 y.o. Harold [name] Primary Care Provider: [name] Other Clinician: Referring Provider: Treating Provider/Extender: Darl Pikes Weeks in Treatment: 7 Debridement Performed for Assessment: Wound #1 Left,Medial Foot Performed By: Physician Kalman Shan, DO Debridement Type: Debridement Severity of Tissue Pre Debridement: Fat layer exposed Level of Consciousness (Pre-procedure): Awake and Alert Pre-procedure Verification/Time Out Yes - 11:08 Taken: Start Time: 11:09 Pain Control: Lidocaine 4% T opical Solution T Area Debrided (L x W): otal 2.3 (cm) x 2.5 (cm) = 5.75 (cm) Tissue and other material debrided: Non-Viable, Slough, Subcutaneous, Slough Level: Skin/Subcutaneous Tissue Debridement Description: Excisional Instrument: Curette Bleeding: Minimum Hemostasis Achieved: Pressure End Time: 11:13 Response to Treatment: Procedure was tolerated well Level of Consciousness  (Post- Awake and Alert procedure): Post Debridement Measurements of Total Wound Length: (cm) 2.3 Width: (cm) 2.5 Depth: (cm) 0.1 Volume: (cm) 0.452 Character of Wound/Ulcer Post Debridement: Stable Severity of Tissue Post Debridement: Fat layer exposed Post Procedure Diagnosis Same as Pre-procedure Electronic Signature(s) Signed: 12/24/2021 2:06:16 PM By: Kalman Shan DO Signed: 12/24/2021 5:27:29 PM By: Lorrin Jackson Entered By: Lorrin Jackson on 12/24/2021 11:13:40 -------------------------------------------------------------------------------- HPI Details Patient Name: Date of Service: [address] [name] 12/24/2021 10:15 A M Medical Record Number: [id number] Patient Account Number: [id number] Date of Birth/Sex: Treating RN: 1932/05/01 (88 y.o. Harold [name] Primary Care Provider: [name] Other Clinician: Referring Provider: Treating Provider/Extender: Darl Pikes Weeks in Treatment: 7 History of Present Illness HPI Description: Admission 11/04/2021 Mr. Fremont Skalicky is an 88 year old male with a past medical history of peripheral arterial disease, type 2 diabetes controlled on oral agents, former tobacco user that presents to the clinic for a medial left foot wound. He states that the wound started in March 2023. He noticed his foot was rubbing against in inside of his shoe creating a wound. He currently wears soft slippers to address this issue. He has been following with his podiatrist Dr. Cannon Kettle for this issue. On 09/19/2021 he had ABIs that were noncompressible with a TBI of 0.3 on the left. He subsequently had an abdominal aortogram on 4/28 that led to a femoropopliteal on 10/24/2021 by Dr. Stanford Breed. He has follow-up with vein and vascular on 11/26/2021. Patient reports chronic pain to the wound site. He has tried Iodoflex, Medihoney and antibiotic ointment to the wound bed with little benefit. 6/1; patient presents for  follow-up. He obtained his x-ray that showed new cortical erosion within the medial aspect of the great toe metatarsal head concerning for acute osteomyelitis. He has been using Santyl to the wound bed. He continues to report chronic pain to the wound site. He currently denies signs of infection. 6/7; he continues with Santyl as the primary dressing which his daughter is  changing there has been some improvement in the surface condition of the wound. He tolerates debridement very poorly. He is tolerating the antibiotics prescribed by infectious disease which is doxycycline and cefadroxil. He has follow-up arterial studies next week status post left Pham pop in April 6/13; patient presents for follow-up. He continues to use Santyl to the wound bed. He is still taking doxycycline and cefadroxil without issues. He has developed what appears to be a hematoma to the left upper leg. This is located to the area from where he had his femoropopliteal 1 month ago. He is not on blood thinner and denies signs of infection. He is scheduled to see vein and vascular today for this issue. 6/20; patient presents for follow-up. He has been using Santyl to the wound bed without issues. He is still on oral antibiotics. He was evaluated by vein and vascular for the fluid collection below below the popliteal incision which was more consistent with a seroma. This has resolved. 6/27; patient presents for follow-up. He continues to use Santyl to the wound bed. He has an appointment scheduled to see cardiology on 7/3 for cardiac clearance of HBO therapy. Patient's insurance has come back and asked for a letter of medical necessity for HBO approval. The letter has been completed and sent. He continues to be on oral antibiotics for his osteomyelitis of the left foot. 7/11; patient presents for follow-up. He continues to use Santyl to the wound bed. He has started HBO therapy without issues. He has completed his oral antibiotics  for his osteomyelitis of the left foot. He denies signs of systemic infection. Electronic Signature(s) Signed: 12/24/2021 2:06:16 PM By: Kalman Shan DO Entered By: Kalman Shan on 12/24/2021 14:01:41 -------------------------------------------------------------------------------- Physical Exam Details Patient Name: Date of Service: [place], [place]. 12/24/2021 10:15 A M Medical Record Number: [id number] Patient Account Number: [id number] Date of Birth/Sex: Treating RN: 06/10/1932 (88 y.o. Harold [name] Primary Care Provider: [name] Other Clinician: Referring Provider: Treating Provider/Extender: Darl Pikes Weeks in Treatment: 7 Constitutional respirations regular, non-labored and within target range for patient.. Cardiovascular 2+ dorsalis pedis/posterior tibialis pulses. Psychiatric pleasant and cooperative. Notes Left foot: T the medial aspect over the first met head there is an open wound with granulation tissue and nonviable tissue. Undermining from the 6 to 9 o'clock o position. Currently no probing to bone. No surrounding signs of soft tissue infection. Electronic Signature(s) Signed: 12/24/2021 2:06:16 PM By: Kalman Shan DO Entered By: Kalman Shan on 12/24/2021 14:02:28 -------------------------------------------------------------------------------- Physician Orders Details Patient Name: Date of Service: [place], [place]. 12/24/2021 10:15 A M Medical Record Number: [id number] Patient Account Number: [id number] Date of Birth/Sex: Treating RN: 09/12/1931 (88 y.o. Harold [name] Primary Care Provider: [name] Other Clinician: Referring Provider: Treating Provider/Extender: Darl Pikes Weeks in Treatment: 7 Verbal / Phone Orders: No Diagnosis Coding ICD-10 Coding Code Description E11.621 Type 2 diabetes mellitus with foot ulcer L97.508 Non-pressure chronic  ulcer of other part of unspecified foot with other specified severity I70.245 Atherosclerosis of native arteries of left leg with ulceration of other part of foot M86.672 Other chronic osteomyelitis, left ankle and foot Follow-up Appointments ppointment in 1 week. - with Dr. Heber [place] and Tammi Klippel, RN (Room 8) 11:15am 12/31/2021 Return A ppointment in 2 weeks. - with Dr. Heber [place] and Leveda Anna, RN (Room 7) 11:00am 01/07/2022 Return A Bathing/ Shower/ Hygiene May shower and wash wound with soap and water. - prior  to dressing change Edema Control - Lymphedema / SCD / Other Elevate legs to the level of the heart or above for 30 minutes daily and/or when sitting, a frequency of: - throughout the day Avoid standing for long periods of time. Moisturize legs daily. Off-Loading Other: - Avoid any direct pressure to left foot [facility] for wound care. [facility] Hyperbaric Oxygen Therapy Evaluate for HBO Therapy Indication: - Diabetic foot wound with Osteomyelitis-Wagner Grade 3 2.0 ATA for 90 Minutes with 2 Five (5) Minute A Breaks ir Total Number of Treatments: - 40 One treatments per day (delivered Monday through Friday unless otherwise specified in Special Instructions below): Finger stick Blood Glucose Pre- and Post- HBOT Treatment. Follow Hyperbaric Oxygen Glycemia Protocol Afrin (Oxymetazoline HCL) 0.05% nasal spray - 1 spray in both nostrils daily as needed prior to HBO treatment for difficulty clearing ears Wound Treatment Wound #1 - Foot Wound Laterality: Left, Medial Cleanser: Soap and Water 1 x Per Day/15 Days Discharge Instructions: May shower and wash wound with dial antibacterial soap and water prior to dressing change. Cleanser: Wound Cleanser 1 x Per Day/15 Days Discharge Instructions: Cleanse the wound with wound cleanser prior to applying a clean dressing using gauze sponges, not tissue or cotton balls. Peri-Wound Care: Zinc Oxide Ointment 30g tube 1 x Per  Day/15 Days Discharge Instructions: Apply Zinc Oxide to periwound with each dressing change Prim Dressing: Santyl Ointment 1 x Per Day/15 Days ary Discharge Instructions: Apply nickel thick amount to wound bed as instructed Secondary Dressing: Woven Gauze Sponge, Non-Sterile 4x4 in 1 x Per Day/15 Days Discharge Instructions: Apply over primary dressing as directed. Secured With: Child psychotherapist, Sterile 2x75 (in/in) 1 x Per Day/15 Days Discharge Instructions: Secure with stretch gauze as directed. Secured With: 3M Medipore Public affairs consultant Surgical T 2x10 (in/yd) 1 x Per Day/15 Days ape Discharge Instructions: Secure with tape as directed. GLYCEMIA INTERVENTIONS PROTOCOL PRE-HBO GLYCEMIA INTERVENTIONS ACTION INTERVENTION Obtain pre-HBO capillary blood glucose (ensure 1 physician order is in chart). A. Notify HBO physician and await physician orders. 2 If result is 70 mg/dl or below: B. If the result meets the hospital definition of a critical result, follow hospital policy. A. Give patient an 8 ounce Glucerna Shake, an 8 ounce Ensure, or 8 ounces of a Glucerna/Ensure equivalent dietary supplement*. B. Wait 30 minutes. If result is 71 mg/dl to 130 mg/dl: C. Retest patients capillary blood glucose (CBG). D. If result greater than or equal to 110 mg/dl, proceed with HBO. If result less than 110 mg/dl, notify HBO physician and consider holding HBO. If result is 131 mg/dl to 249 mg/dl: A. Proceed with HBO. A. Notify HBO physician and await physician orders. B. It is recommended to hold HBO and do If result is 250 mg/dl or greater: blood/urine ketone testing. C. If the result meets the hospital definition of a critical result, follow hospital policy. POST-HBO GLYCEMIA INTERVENTIONS ACTION INTERVENTION Obtain post HBO capillary blood glucose (ensure 1 physician order is in chart). A. Notify HBO physician and await physician orders. 2 If result is 70 mg/dl or  below: B. If the result meets the hospital definition of a critical result, follow hospital policy. A. Give patient an 8 ounce Glucerna Shake, an 8 ounce Ensure, or 8 ounces of a Glucerna/Ensure equivalent dietary supplement*. B. Wait 15 minutes for symptoms of If result is 71 mg/dl to 100 mg/dl: hypoglycemia (i.e. nervousness, anxiety, sweating, chills, clamminess, irritability, confusion, tachycardia or dizziness). C.  If patient asymptomatic, discharge patient. If patient symptomatic, repeat capillary blood glucose (CBG) and notify HBO physician. If result is 101 mg/dl to 249 mg/dl: A. Discharge patient. A. Notify HBO physician and await physician orders. B. It is recommended to do blood/urine ketone If result is 250 mg/dl or greater: testing. C. If the result meets the hospital definition of a critical result, follow hospital policy. *Juice or candies are NOT equivalent products. If patient refuses the Glucerna or Ensure, please consult the hospital dietitian for an appropriate substitute. Electronic Signature(s) Signed: 12/24/2021 2:06:16 PM By: Kalman Shan DO Entered By: Kalman Shan on 12/24/2021 14:02:40 -------------------------------------------------------------------------------- Problem List Details Patient Name: Date of Service: [address], [place] [name] 12/24/2021 10:15 A M Medical Record Number: [id number] Patient Account Number: [id number] Date of Birth/Sex: Treating RN: Nov 04, 1931 (88 y.o. Harold [name] Primary Care Provider: [name] Other Clinician: Referring Provider: Treating Provider/Extender: Darl Pikes Weeks in Treatment: 7 Active Problems ICD-10 Encounter Code Description Active Date MDM Diagnosis E11.621 Type 2 diabetes mellitus with foot ulcer 11/04/2021 No Yes L97.508 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 with other 11/04/2021 No Yes specified severity I70.245  Atherosclerosis of native arteries of left leg with ulceration of other part of 11/04/2021 No Yes foot M86.672 Other chronic osteomyelitis, left ankle and foot 12/10/2021 No Yes Inactive Problems Resolved Problems Electronic Signature(s) Signed: 12/24/2021 2:06:16 PM By: Kalman Shan DO Entered By: Kalman Shan on 12/24/2021 14:00:56 -------------------------------------------------------------------------------- Progress Note Details Patient Name: Date of Service: [place], [place]. 12/24/2021 10:15 A M Medical Record Number: [id number] Patient Account Number: [id number] Date of Birth/Sex: Treating RN: [birth year] (88 y.o. Harold [name] Primary Care Provider: [name] Other Clinician: Referring Provider: Treating Provider/Extender: Darl Pikes Weeks in Treatment: 7 Subjective Chief Complaint Information obtained from Patient 11/04/2021; left foot wound History of Present Illness (HPI) Admission 11/04/2021 Mr. Levante Simones is an 88 year old male with a past medical history of peripheral arterial disease, type 2 diabetes controlled on oral agents, former tobacco user that presents to the clinic for a medial left foot wound. He states that the wound started in March 2023. He noticed his foot was rubbing against in inside of his shoe creating a wound. He currently wears soft slippers to address this issue. He has been following with his podiatrist Dr. Cannon Kettle for this issue. On 09/19/2021 he had ABIs that were noncompressible with a TBI of 0.3 on the left. He subsequently had an abdominal aortogram on 4/28 that led to a femoropopliteal on 10/24/2021 by Dr. Stanford Breed. He has follow-up with vein and vascular on 11/26/2021. Patient reports chronic pain to the wound site. He has tried Iodoflex, Medihoney and antibiotic ointment to the wound bed with little benefit. 6/1; patient presents for follow-up. He obtained his x-ray that showed new  cortical erosion within the medial aspect of the great toe metatarsal head concerning for acute osteomyelitis. He has been using Santyl to the wound bed. He continues to report chronic pain to the wound site. He currently denies signs of infection. 6/7; he continues with Santyl as the primary dressing which his daughter is changing there has been some improvement in the surface condition of the wound. He tolerates debridement very poorly. He is tolerating the antibiotics prescribed by infectious disease which is doxycycline and cefadroxil. He has follow-up arterial studies next week status post left Pham pop in April 6/13; patient presents for follow-up. He continues  to use Santyl to the wound bed. He is still taking doxycycline and cefadroxil without issues. He has developed what appears to be a hematoma to the left upper leg. This is located to the area from where he had his femoropopliteal 1 month ago. He is not on blood thinner and denies signs of infection. He is scheduled to see vein and vascular today for this issue. 6/20; patient presents for follow-up. He has been using Santyl to the wound bed without issues. He is still on oral antibiotics. He was evaluated by vein and vascular for the fluid collection below below the popliteal incision which was more consistent with a seroma. This has resolved. 6/27; patient presents for follow-up. He continues to use Santyl to the wound bed. He has an appointment scheduled to see cardiology on 7/3 for cardiac clearance of HBO therapy. Patient's insurance has come back and asked for a letter of medical necessity for HBO approval. The letter has been completed and sent. He continues to be on oral antibiotics for his osteomyelitis of the left foot. 7/11; patient presents for follow-up. He continues to use Santyl to the wound bed. He has started HBO therapy without issues. He has completed his oral antibiotics for his osteomyelitis of the left foot. He denies  signs of systemic infection. Patient History Information obtained from Patient, Chart. Family History Unknown History. Social History Former smoker, Alcohol Use - Never, Drug Use - No History, Caffeine Use - Rarely. Medical History Cardiovascular Patient has history of Coronary Artery Disease, Hypertension, Myocardial Infarction, Peripheral Arterial Disease Endocrine Patient has history of Type II Diabetes Medical A Surgical History Notes nd Gastrointestinal GERD Objective Constitutional respirations regular, non-labored and within target range for patient.. Vitals Time Taken: 10:57 AM, Height: 71 in, Weight: 135 lbs, BMI: 18.8, Temperature: 97.9 F, Pulse: 68 bpm, Respiratory Rate: 18 breaths/min, Blood Pressure: 128/72 mmHg, Capillary Blood Glucose: 118 mg/dl. Cardiovascular 2+ dorsalis pedis/posterior tibialis pulses. Psychiatric pleasant and cooperative. General Notes: Left foot: T the medial aspect over the first met head there is an open wound with granulation tissue and nonviable tissue. Undermining from o the 6 to 9 o'clock position. Currently no probing to bone. No surrounding signs of soft tissue infection. Integumentary (Hair, Skin) Wound #1 status is Open. Original cause of wound was Pressure Injury. The date acquired was: 09/10/2021. The wound has been in treatment 7 weeks. The wound is located on the Left,Medial Foot. The wound measures 2.3cm length x 2.5cm width x 0.1cm depth; 4.516cm^2 area and 0.452cm^3 volume. There is Fat Layer (Subcutaneous Tissue) exposed. There is no tunneling or undermining noted. There is a medium amount of purulent drainage noted. The wound margin is distinct with the outline attached to the wound base. There is medium (34-66%) red granulation within the wound bed. There is a medium (34-66%) amount of necrotic tissue within the wound bed including Adherent Slough. Assessment Active Problems ICD-10 Type 2 diabetes mellitus with foot  ulcer 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 with other specified severity Atherosclerosis of native arteries of left leg with ulceration of other part of foot Other chronic osteomyelitis, left ankle and foot Patient's wound has shown improvement in appearance since last clinic visit. There is more granulation tissue present. The size is stable. I debrided nonviable tissue. I recommended continuing Santyl. I used silver nitrate to the hyper granulated areas. No signs of surrounding soft tissue infection. He is doing HBO therapy without issues. I recommended continuing this. Procedures Wound #  1 Pre-procedure diagnosis of Wound #1 is a Diabetic Wound/Ulcer of the Lower Extremity located on the Left,Medial Foot .Severity of Tissue Pre Debridement is: Fat layer exposed. There was a Excisional Skin/Subcutaneous Tissue Debridement with a total area of 5.75 sq cm performed by Kalman Shan, DO. With the following instrument(s): Curette to remove Non-Viable tissue/material. Material removed includes Subcutaneous Tissue and Slough and after achieving pain control using Lidocaine 4% T opical Solution. No specimens were taken. A time out was conducted at 11:08, prior to the start of the procedure. A Minimum amount of bleeding was controlled with Pressure. The procedure was tolerated well. Post Debridement Measurements: 2.3cm length x 2.5cm width x 0.1cm depth; 0.452cm^3 volume. Character of Wound/Ulcer Post Debridement is stable. Severity of Tissue Post Debridement is: Fat layer exposed. Post procedure Diagnosis Wound #1: Same as Pre-Procedure Plan Follow-up Appointments: Return Appointment in 1 week. - with Dr. Heber [place] and Tammi Klippel, RN (Room 8) 11:15am 12/31/2021 Return Appointment in 2 weeks. - with Dr. Heber [place] and Leveda Anna, RN (Room 7) 11:00am 01/07/2022 Bathing/ Shower/ Hygiene: May shower and wash wound with soap and water. - prior to dressing change Edema Control - Lymphedema / SCD  / Other: Elevate legs to the level of the heart or above for 30 minutes daily and/or when sitting, a frequency of: - throughout the day Avoid standing for long periods of time. Moisturize legs daily. Off-Loading: Other: - Avoid any direct pressure to left foot Home Health: [facility] for wound care. [facility] Hyperbaric Oxygen Therapy: Evaluate for HBO Therapy Indication: - Diabetic foot wound with Osteomyelitis-Wagner Grade 3 2.0 ATA for 90 Minutes with 2 Five (5) Minute Air Breaks T Number of Treatments: - 40 otal One treatments per day (delivered Monday through Friday unless otherwise specified in Special Instructions below): Finger stick Blood Glucose Pre- and Post- HBOT Treatment. Follow Hyperbaric Oxygen Glycemia Protocol Afrin (Oxymetazoline HCL) 0.05% nasal spray - 1 spray in both nostrils daily as needed prior to HBO treatment for difficulty clearing ears WOUND #1: - Foot Wound Laterality: Left, Medial Cleanser: Soap and Water 1 x Per Day/15 Days Discharge Instructions: May shower and wash wound with dial antibacterial soap and water prior to dressing change. Cleanser: Wound Cleanser 1 x Per Day/15 Days Discharge Instructions: Cleanse the wound with wound cleanser prior to applying a clean dressing using gauze sponges, not tissue or cotton balls. Peri-Wound Care: Zinc Oxide Ointment 30g tube 1 x Per Day/15 Days Discharge Instructions: Apply Zinc Oxide to periwound with each dressing change Prim Dressing: Santyl Ointment 1 x Per Day/15 Days ary Discharge Instructions: Apply nickel thick amount to wound bed as instructed Secondary Dressing: Woven Gauze Sponge, Non-Sterile 4x4 in 1 x Per Day/15 Days Discharge Instructions: Apply over primary dressing as directed. Secured With: Child psychotherapist, Sterile 2x75 (in/in) 1 x Per Day/15 Days Discharge Instructions: Secure with stretch gauze as directed. Secured With: 54M Medipore Public affairs consultant Surgical T  2x10 (in/yd) 1 x Per Day/15 Days ape Discharge Instructions: Secure with tape as directed. 1. Santyl 2. Silver nitrate 3. In office sharp debridement 4. Continue HBO therapy 5. Follow-up in 1 week Electronic Signature(s) Signed: 12/24/2021 2:06:16 PM By: Kalman Shan DO Entered By: Kalman Shan on 12/24/2021 14:04:09 -------------------------------------------------------------------------------- HxROS Details Patient Name: Date of Service: [address], [place] [name] 12/24/2021 10:15 A M Medical Record Number: [id number] Patient Account Number: [id number] Date of Birth/Sex: Treating RN: January 07, 1932 (88 y.o. Harold [name] Primary Care Provider: Josetta Huddle  Barrie Folk Other Clinician: Referring Provider: Treating Provider/Extender: Darl Pikes Weeks in Treatment: 7 Information Obtained From Patient Chart Cardiovascular Medical History: Positive for: Coronary Artery Disease; Hypertension; Myocardial Infarction; Peripheral Arterial Disease Gastrointestinal Medical History: Past Medical History Notes: GERD Endocrine Medical History: Positive for: Type II Diabetes Treated with: Insulin, Oral agents Blood sugar tested every day: No Immunizations Pneumococcal Vaccine: Received Pneumococcal Vaccination: Yes Received Pneumococcal Vaccination On or After 60th Birthday: No Implantable Devices None Family and Social History Unknown History: Yes; Former smoker; Alcohol Use: Never; Drug Use: No History; Caffeine Use: Rarely; Financial Concerns: No; Food, Clothing or Shelter Needs: No; Support System Lacking: No; Transportation Concerns: No Electronic Signature(s) Signed: 12/24/2021 2:06:16 PM By: Kalman Shan DO Signed: 12/24/2021 5:27:29 PM By: Lorrin Jackson Entered By: Kalman Shan on 12/24/2021 14:01:47 -------------------------------------------------------------------------------- SuperBill Details Patient Name: Date of Service: [name], [place]. 12/24/2021 Medical Record Number: [id number] Patient Account Number: [id number] Date of Birth/Sex: Treating RN: Jan 07, 1932 (88 y.o. 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, left ankle and foot Facility Procedures CPT4 Code: [id number] Description: 23536 - DEB SUBQ TISSUE 20 SQ CM/< ICD-10 Diagnosis Description L97.508 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

## 2021-12-24 NOTE — Progress Notes (Addendum)
Harold [name], Harold [name] ([id number]) Visit Report for 12/24/2021 HBO Details Patient Name: Date of Service: [place], [place]. 12/24/2021 8:00 A M Medical Record Number: [id number] Patient Account Number: [id number] Date of Birth/Sex: Treating RN: 1932/01/21 (88 y.o. Harold [name] Primary Care Kathlyne Loud: [name] Other Clinician: Donavan Burnet Referring Rhona Fusilier: Treating Egan Sahlin/Extender: Darl Pikes Weeks in Treatment: 7 HBO Treatment Course Details Treatment Course Number: 1 Ordering Exa Bomba: Kalman Shan T Treatments Ordered: otal 40 HBO Treatment Start 12/23/2021 Date: HBO Indication: Diabetic Ulcer(s) of the Lower Extremity HBO Treatment End 12/24/2021 Date: Standard/Conservative Wound Care tried and failed greater than or equal to 30 days HBO Discharge Treatment Series Complete; No Change / No Outcome: Improvement Wound #1 Left, Medial Foot HBO Treatment Details Treatment Number: 2 Patient Type: Outpatient Chamber Type: Monoplace Chamber Serial #: [id number] Treatment Protocol: 2.0 ATA with 90 minutes oxygen, with two 5 minute air breaks Treatment Details Compression Rate Down: 1.0 psi / minute De-Compression Rate Up: 1.0 psi / minute A breaks and breathing ir Compress Tx Pressure periods Decompress Decompress Begins Reached (leave unused spaces Begins Ends blank) Chamber Pressure (ATA '1 2 2 2 2 2 '$ --2 1 ) Clock Time (24 hr) 08:20 08:33 09:03 09:08 09:38 09:41 - - 10:11 10:26 Treatment Length: 126 (minutes) Treatment Segments: 4 Vital Signs Capillary Blood Glucose Reference Range: 80 - 120 mg / dl HBO Diabetic Blood Glucose Intervention Range: <131 mg/dl or >249 mg/dl Capillary Time Pulse Blood Pulse: Respiratory Temperature: Blood Glucose Oximetry Action Type: Vitals Pressure: Rate: Glucose Meter #: Taken: Taken: (%) (mg/dl): Pre 07:52 125/71 68 16 97.8 110 1 Patient given 8 oz Glucerna Shake Diastolic BP  <91MBWG, asymptomatic for Post 10:31 116/57 58 16 97.5 139 hypotension Pre 08:17 140 Proceed with HBO Tx Treatment Response Treatment Toleration: Well Treatment Completion Status: Treatment Completed without Adverse Event Treatment Notes After safely placing patient in chamber, patient travelled at a rate of 1 psi/min with no reported issues. Patient tolerated treatment well. During decompression of chamber patient travelled at 1 psi/min as well with no problems. 12/26/2021 Patient had amputation of toe. HBO stopped per Dr. Heber Weldon. Additional Procedure Documentation Tissue Sevierity: Fat layer exposed Electronic Signature(s) Signed: 12/26/2021 8:39:38 AM By: Valeria Batman EMT Signed: 12/27/2021 11:50:25 AM By: Kalman Shan DO Previous Signature: 12/26/2021 8:29:14 AM Version By: Valeria Batman EMT Previous Signature: 12/24/2021 11:03:33 AM Version By: Donavan Burnet CHT EMT BS , , Previous Signature: 12/24/2021 11:03:33 AM Version By: Donavan Burnet CHT EMT BS , , Previous Signature: 12/24/2021 2:06:16 PM Version By: Kalman Shan DO Previous Signature: 12/24/2021 10:57:43 AM Version By: Donavan Burnet CHT EMT BS , , Previous Signature: 12/24/2021 10:25:43 AM Version By: Donavan Burnet CHT EMT BS , , Entered By: Valeria Batman on 12/26/2021 08:39:38 -------------------------------------------------------------------------------- HBO Safety Checklist Details Patient Name: Date of Service: Harold [name], Harold [name] 12/24/2021 8:00 A M Medical Record Number: [id number] Patient Account Number: [id number] Date of Birth/Sex: Treating RN: 02-01-1932 (88 y.o. Harold [name] Primary Care Quan Cybulski: [name] Other Clinician: Valeria Batman Referring Shawnia Vizcarrondo: Treating Ruger Saxer/Extender: Darl Pikes Weeks in Treatment: 7 HBO Safety Checklist Items Safety Checklist Consent Form Signed Patient voided / foley secured and emptied When  did you last eato 0610 Last dose of injectable or oral agent 0610 Ostomy pouch emptied and vented if applicable NA All implantable devices assessed, documented and approved NA Intravenous access site secured and place NA Valuables secured Linens  and cotton and cotton/polyester blend (less than 51% polyester) Personal oil-based products / skin lotions / body lotions removed Wigs or hairpieces removed NA Smoking or tobacco materials removed NA Books / newspapers / magazines / loose paper removed Cologne, aftershave, perfume and deodorant removed Jewelry removed (may wrap wedding band) Make-up removed NA Hair care products removed Battery operated devices (external) removed Heating patches and chemical warmers removed Titanium eyewear removed NA Nail polish cured greater than 10 hours NA Casting material cured greater than 10 hours NA Hearing aids removed doesn't wear his hearing aids Loose dentures or partials removed Prosthetics have been removed NA Patient demonstrates correct use of air break device (if applicable) Patient concerns have been addressed Patient grounding bracelet on and cord attached to chamber Specifics for Inpatients (complete in addition to above) Medication sheet sent with patient NA Intravenous medications needed or due during therapy sent with patient NA Drainage tubes (Kingg. nasogastric tube or chest tube secured and vented) NA Endotracheal or Tracheotomy tube secured NA Cuff deflated of air and inflated with saline NA Airway suctioned NA Notes Paper version used prior to treatment. Electronic Signature(s) Signed: 12/24/2021 10:55:32 AM By: Donavan Burnet CHT EMT BS , , Previous Signature: 12/24/2021 10:24:48 AM Version By: Donavan Burnet CHT EMT BS , , Entered By: Donavan Burnet on 12/24/2021 10:55:32

## 2021-12-24 NOTE — Progress Notes (Addendum)
LUISDANIEL, KENTON ([id number]) Visit Report for 12/24/2021 Arrival Information Details Patient Name: Date of Service: [place], [place]. 12/24/2021 10:15 A M Medical Record Number: [id number] Patient Account Number: [id number] Date of Birth/Sex: Treating RN: Aug 21, 1931 (88 y.o. [name] Primary Care Deiona Hooper: [name] Other Clinician: Referring Delma Drone: Treating Aleece Loyd/Extender: Darl Pikes Weeks in Treatment: 7 Visit Information History Since Last Visit Added or deleted any medications: No Patient Arrived: Kasandra Knudsen Any new allergies or adverse reactions: No Arrival Time: 10:51 Had a fall or experienced change in No Accompanied By: Daughter activities of daily living that may affect Transfer Assistance: None risk of falls: Patient Identification Verified: Yes Signs or symptoms of abuse/neglect since last visito No Secondary Verification Process Completed: Yes Hospitalized since last visit: No Patient Requires Transmission-Based Precautions: No Implantable device outside of the clinic excluding No Patient Has Alerts: Yes cellular tissue based products placed in the center Patient Alerts: R ABI [place] R TBI: 0.53 since last visit: L ABI [place] L TBI: 0.30 Has Dressing in Place as Prescribed: Yes Pain Present Now: No Electronic Signature(s) Signed: 12/24/2021 5:27:29 PM By: Lorrin Jackson Entered By: Lorrin Jackson on 12/24/2021 11:23:17 -------------------------------------------------------------------------------- Encounter Discharge Information Details Patient Name: Date of Service: [address], [name] 12/24/2021 10:15 A M Medical Record Number: [id number] Patient Account Number: [id number] Date of Birth/Sex: Treating RN: [birth year] (88 y.o. [name] Primary Care Nitza Schmid: [name] Other Clinician: Referring Zaneta Lightcap: Treating Ellise Kovack/Extender: Darl Pikes Weeks in Treatment:  7 Encounter Discharge Information Items Post Procedure Vitals Discharge Condition: Stable Temperature (F): 97.9 Ambulatory Status: Cane Pulse (bpm): 68 Discharge Destination: Home Respiratory Rate (breaths/min): 18 Transportation: Private Auto Blood Pressure (mmHg): 128/72 Accompanied By: daughter Schedule Follow-up Appointment: Yes Clinical Summary of Care: Provided on 12/24/2021 Form Type Recipient Paper Patient Patient Electronic Signature(s) Signed: 12/24/2021 5:27:29 PM By: Lorrin Jackson Entered By: Lorrin Jackson on 12/24/2021 11:23:07 -------------------------------------------------------------------------------- Lower Extremity Assessment Details Patient Name: Date of Service: [place], [place]. 12/24/2021 10:15 A M Medical Record Number: [id number] Patient Account Number: [id number] Date of Birth/Sex: Treating RN: [birth year] (88 y.o. [name] Primary Care Artemis Koller: [name] Other Clinician: Referring Geraldyn Shain: Treating Lillie Portner/Extender: Darl Pikes Weeks in Treatment: 7 Edema Assessment Assessed: [Left: Yes] [Right: No] Edema: [Left: N] [Right: o] Calf Left: Right: Point of Measurement: 31 cm From Medial Instep 34.7 cm Ankle Left: Right: Point of Measurement: 10 cm From Medial Instep 23.5 cm Vascular Assessment Pulses: Dorsalis Pedis Palpable: [Left:Yes] Electronic Signature(s) Signed: 12/24/2021 5:27:29 PM By: Lorrin Jackson Entered By: Lorrin Jackson on 12/24/2021 10:58:38 -------------------------------------------------------------------------------- Multi Wound Chart Details Patient Name: Date of Service: [address] [name] 12/24/2021 10:15 A M Medical Record Number: [id number] Patient Account Number: [id number] Date of Birth/Sex: Treating RN: 1931/06/26 (88 y.o. [name] Primary Care Catha Ontko: [name] Other Clinician: Referring Lajeana Strough: Treating Wilba Mutz/Extender:  Darl Pikes Weeks in Treatment: 7 Vital Signs Height(in): 71 Capillary Blood Glucose(mg/dl): 118 Weight(lbs): 135 Pulse(bpm): 59 Body Mass Index(BMI): 18.8 Blood Pressure(mmHg): 128/72 Temperature(F): 97.9 Respiratory Rate(breaths/min): 18 Photos: [1:Left, Medial Foot] [N/A:N/A N/A] Wound Location: [1:Pressure Injury] [N/A:N/A] Wounding Event: [1:Diabetic Wound/Ulcer of the Lower] [N/A:N/A] Primary Etiology: [1:Extremity Coronary Artery Disease,] [N/A:N/A] Comorbid History: [1:Hypertension, Myocardial Infarction, Peripheral Arterial Disease, Type II Diabetes 09/10/2021] [N/A:N/A] Date Acquired: [1:7] [N/A:N/A] Weeks of Treatment: [1:Open] [N/A:N/A] Wound Status: [1:No] [N/A:N/A] Wound Recurrence: [1:2.3x2.5x0.1] [N/A:N/A] Measurements  L x W x D (cm) [1:4.516] [N/A:N/A] A (cm) : rea [1:0.452] [N/A:N/A] Volume (cm) : [1:21.10%] [N/A:N/A] % Reduction in A [1:rea: 21.10%] [N/A:N/A] % Reduction in Volume: [1:Grade 2] [N/A:N/A] Classification: [1:Medium] [N/A:N/A] Exudate A mount: [1:Purulent] [N/A:N/A] Exudate Type: [1:yellow, brown, green] [N/A:N/A] Exudate Color: [1:Distinct, outline attached] [N/A:N/A] Wound Margin: [1:Medium (34-66%)] [N/A:N/A] Granulation A mount: [1:Red] [N/A:N/A] Granulation Quality: [1:Medium (34-66%)] [N/A:N/A] Necrotic A mount: [1:Fat Layer (Subcutaneous Tissue): Yes N/A] Exposed Structures: [1:Fascia: No Tendon: No Muscle: No Joint: No Bone: No Small (1-33%)] [N/A:N/A] Epithelialization: [1:Debridement - Excisional] [N/A:N/A] Debridement: Pre-procedure Verification/Time Out 11:08 [N/A:N/A] Taken: [1:Lidocaine 4% Topical Solution] [N/A:N/A] Pain Control: [1:Subcutaneous, Slough] [N/A:N/A] Tissue Debrided: [1:Skin/Subcutaneous Tissue] [N/A:N/A] Level: [1:5.75] [N/A:N/A] Debridement A (sq cm): [1:rea Curette] [N/A:N/A] Instrument: [1:Minimum] [N/A:N/A] Bleeding: [1:Pressure] [N/A:N/A] Hemostasis A chieved: [1:Procedure  was tolerated well] [N/A:N/A] Debridement Treatment Response: [1:2.3x2.5x0.1] [N/A:N/A] Post Debridement Measurements L x W x D (cm) [1:0.452] [N/A:N/A] Post Debridement Volume: (cm) [1:Debridement] [N/A:N/A] Treatment Notes Wound #1 (Foot) Wound Laterality: Left, Medial Cleanser Soap and Water Discharge Instruction: May shower and wash wound with dial antibacterial soap and water prior to dressing change. Wound Cleanser Discharge Instruction: Cleanse the wound with wound cleanser prior to applying a clean dressing using gauze sponges, not tissue or cotton balls. Peri-Wound Care Zinc Oxide Ointment 30g tube Discharge Instruction: Apply Zinc Oxide to periwound with each dressing change Topical Primary Dressing Santyl Ointment Discharge Instruction: Apply nickel thick amount to wound bed as instructed Secondary Dressing Woven Gauze Sponge, Non-Sterile 4x4 in Discharge Instruction: Apply over primary dressing as directed. Secured With Conforming Stretch Gauze Bandage, Sterile 2x75 (in/in) Discharge Instruction: Secure with stretch gauze as directed. 58M Medipore Soft Cloth Surgical T 2x10 (in/yd) ape Discharge Instruction: Secure with tape as directed. Compression Wrap Compression Stockings Add-Ons Electronic Signature(s) Signed: 12/24/2021 2:06:16 PM By: Kalman Shan DO Signed: 12/24/2021 5:27:29 PM By: Lorrin Jackson Entered By: Kalman Shan on 12/24/2021 14:01:01 -------------------------------------------------------------------------------- Multi-Disciplinary Care Plan Details Patient Name: Date of Service: [name], [place]. 12/24/2021 10:15 A M Medical Record Number: [id number] Patient Account Number: [id number] Date of Birth/Sex: Treating RN: September 23, 1931 (88 y.o. [name] Primary Care Dashonna Chagnon: [name] Other Clinician: Referring Brielle Moro: Treating Pearlina Friedly/Extender: Adline Mango in Treatment:  7 Multidisciplinary Care Plan reviewed with physician Active Inactive Electronic Signature(s) Signed: 01/20/2022 5:00:17 PM By: Deon Pilling RN, [name] Signed: 01/21/2022 4:55:14 PM By: Lorrin Jackson Previous Signature: 12/24/2021 5:27:29 PM Version By: Lorrin Jackson Entered By: Deon Pilling on 01/20/2022 17:00:17 -------------------------------------------------------------------------------- Pain Assessment Details Patient Name: Date of Service: Lessie Dings [name] 12/24/2021 10:15 A M Medical Record Number: [id number] Patient Account Number: [id number] Date of Birth/Sex: Treating RN: [birth year] (88 y.o. [name] Primary Care Jesscia Imm: [name] Other Clinician: Referring Tywone Bembenek: Treating Jan Walters/Extender: Darl Pikes Weeks in Treatment: 7 Active Problems Location of Pain Severity and Description of Pain Patient Has Paino No Site Locations Pain Management and Medication Current Pain Management: Electronic Signature(s) Signed: 12/24/2021 5:27:29 PM By: Lorrin Jackson Entered By: Lorrin Jackson on 12/24/2021 10:58:27 -------------------------------------------------------------------------------- Patient/Caregiver Education Details Patient Name: Date of Service: [place], [place] 7/11/2023andnbsp10:15 A M Medical Record Number: [id number] Patient Account Number: [id number] Date of Birth/Gender: Treating RN: July 04, 1931 (88 y.o. [name] Primary Care Physician: [name] Other Clinician: Referring Physician: Treating Physician/Extender: Adline Mango in Treatment: 7 Education Assessment Education Provided To: Patient Education Topics Provided  Elevated Blood Sugar/ Impact on Healing: Methods: Explain/Verbal, Printed Responses: State content correctly Wound/Skin Impairment: Methods: Explain/Verbal, Printed Responses: State content correctly Electronic  Signature(s) Signed: 12/24/2021 5:27:29 PM By: Lorrin Jackson Entered By: Lorrin Jackson on 12/24/2021 10:34:00 -------------------------------------------------------------------------------- Wound Assessment Details Patient Name: Date of Service: Lessie Dings [name] 12/24/2021 10:15 A M Medical Record Number: [id number] Patient Account Number: [id number] Date of Birth/Sex: Treating RN: March 14, 1932 (88 y.o. [name] Primary Care Ziare Cryder: [name] Other Clinician: Referring Keldan Eplin: Treating Adrean Heitz/Extender: Darl Pikes Weeks in Treatment: 7 Wound Status Wound Number: 1 Primary Diabetic Wound/Ulcer of the Lower Extremity Etiology: Wound Location: Left, Medial Foot Wound Open Wounding Event: Pressure Injury Status: Date Acquired: 09/10/2021 Comorbid Coronary Artery Disease, Hypertension, Myocardial Infarction, Weeks Of Treatment: 7 History: Peripheral Arterial Disease, Type II Diabetes Clustered Wound: No Photos Wound Measurements Length: (cm) 2.3 Width: (cm) 2.5 Depth: (cm) 0.1 Area: (cm) 4.516 Volume: (cm) 0.452 % Reduction in Area: 21.1% % Reduction in Volume: 21.1% Epithelialization: Small (1-33%) Tunneling: No Undermining: No Wound Description Classification: Grade 2 Wound Margin: Distinct, outline attached Exudate Amount: Medium Exudate Type: Purulent Exudate Color: yellow, brown, green Foul Odor After Cleansing: No Slough/Fibrino Yes Wound Bed Granulation Amount: Medium (34-66%) Exposed Structure Granulation Quality: Red Fascia Exposed: No Necrotic Amount: Medium (34-66%) Fat Layer (Subcutaneous Tissue) Exposed: Yes Necrotic Quality: Adherent Slough Tendon Exposed: No Muscle Exposed: No Joint Exposed: No Bone Exposed: No Electronic Signature(s) Signed: 12/24/2021 5:27:29 PM By: Lorrin Jackson Entered By: Lorrin Jackson on 12/24/2021  10:56:01 -------------------------------------------------------------------------------- Vitals Details Patient Name: Date of Service: [address] [name] 12/24/2021 10:15 A M Medical Record Number: [id number] Patient Account Number: [id number] Date of Birth/Sex: Treating RN: 1931-08-27 (88 y.o. [name] Primary Care Madden Piazza: [name] Other Clinician: Referring Nikolaos Maddocks: Treating Holly Pring/Extender: Darl Pikes Weeks in Treatment: 7 Vital Signs Time Taken: 10:57 Temperature (F): 97.9 Height (in): 71 Pulse (bpm): 68 Weight (lbs): 135 Respiratory Rate (breaths/min): 18 Body Mass Index (BMI): 18.8 Blood Pressure (mmHg): 128/72 Capillary Blood Glucose (mg/dl): 118 Reference Range: 80 - 120 mg / dl Electronic Signature(s) Signed: 12/24/2021 5:27:29 PM By: Lorrin Jackson Entered By: Lorrin Jackson on 12/24/2021 10:58:17

## 2021-12-25 ENCOUNTER — Emergency Department (HOSPITAL_BASED_OUTPATIENT_CLINIC_OR_DEPARTMENT_OTHER): Payer: Medicare Other

## 2021-12-25 ENCOUNTER — Telehealth: Payer: Self-pay

## 2021-12-25 ENCOUNTER — Emergency Department (HOSPITAL_BASED_OUTPATIENT_CLINIC_OR_DEPARTMENT_OTHER): Payer: Medicare Other | Admitting: Radiology

## 2021-12-25 ENCOUNTER — Emergency Department (HOSPITAL_BASED_OUTPATIENT_CLINIC_OR_DEPARTMENT_OTHER)
Admission: EM | Admit: 2021-12-25 | Discharge: 2021-12-25 | Disposition: A | Discharge status: Home / Self Care | Payer: Medicare Other | Attending: Emergency Medicine | Admitting: Emergency Medicine

## 2021-12-25 ENCOUNTER — Other Ambulatory Visit: Payer: Self-pay

## 2021-12-25 ENCOUNTER — Encounter (HOSPITAL_COMMUNITY): Payer: Self-pay | Admitting: Vascular Surgery

## 2021-12-25 ENCOUNTER — Encounter (HOSPITAL_BASED_OUTPATIENT_CLINIC_OR_DEPARTMENT_OTHER): Payer: Self-pay

## 2021-12-25 ENCOUNTER — Encounter (HOSPITAL_BASED_OUTPATIENT_CLINIC_OR_DEPARTMENT_OTHER): Payer: Medicare Other | Admitting: General Surgery

## 2021-12-25 DIAGNOSIS — J90 Pleural effusion, not elsewhere classified: Secondary | ICD-10-CM | POA: Diagnosis not present

## 2021-12-25 DIAGNOSIS — M869 Osteomyelitis, unspecified: Secondary | ICD-10-CM | POA: Insufficient documentation

## 2021-12-25 DIAGNOSIS — Z7984 Long term (current) use of oral hypoglycemic drugs: Secondary | ICD-10-CM | POA: Diagnosis not present

## 2021-12-25 DIAGNOSIS — E119 Type 2 diabetes mellitus without complications: Secondary | ICD-10-CM | POA: Insufficient documentation

## 2021-12-25 DIAGNOSIS — Z87891 Personal history of nicotine dependence: Secondary | ICD-10-CM | POA: Insufficient documentation

## 2021-12-25 DIAGNOSIS — R509 Fever, unspecified: Secondary | ICD-10-CM

## 2021-12-25 DIAGNOSIS — I739 Peripheral vascular disease, unspecified: Secondary | ICD-10-CM

## 2021-12-25 DIAGNOSIS — J9811 Atelectasis: Secondary | ICD-10-CM | POA: Diagnosis not present

## 2021-12-25 DIAGNOSIS — S91302A Unspecified open wound, left foot, initial encounter: Secondary | ICD-10-CM | POA: Diagnosis not present

## 2021-12-25 DIAGNOSIS — M86172 Other acute osteomyelitis, left ankle and foot: Secondary | ICD-10-CM

## 2021-12-25 DIAGNOSIS — M86171 Other acute osteomyelitis, right ankle and foot: Secondary | ICD-10-CM | POA: Diagnosis not present

## 2021-12-25 DIAGNOSIS — Z7982 Long term (current) use of aspirin: Secondary | ICD-10-CM | POA: Diagnosis not present

## 2021-12-25 LAB — CBC WITH DIFFERENTIAL/PLATELET
Abs Immature Granulocytes: 0.04 10*3/uL (ref 0.00–0.07)
Basophils Absolute: 0 10*3/uL (ref 0.0–0.1)
Basophils Relative: 0 %
Eosinophils Absolute: 0 10*3/uL (ref 0.0–0.5)
Eosinophils Relative: 0 %
HCT: 36.4 % — ABNORMAL LOW (ref 39.0–52.0)
Hemoglobin: 11.9 g/dL — ABNORMAL LOW (ref 13.0–17.0)
Immature Granulocytes: 1 %
Lymphocytes Relative: 14 %
Lymphs Abs: 1.2 10*3/uL (ref 0.7–4.0)
MCH: 30.7 pg (ref 26.0–34.0)
MCHC: 32.7 g/dL (ref 30.0–36.0)
MCV: 93.8 fL (ref 80.0–100.0)
Monocytes Absolute: 0.6 10*3/uL (ref 0.1–1.0)
Monocytes Relative: 7 %
Neutro Abs: 6.9 10*3/uL (ref 1.7–7.7)
Neutrophils Relative %: 78 %
Platelets: 239 10*3/uL (ref 150–400)
RBC: 3.88 MIL/uL — ABNORMAL LOW (ref 4.22–5.81)
RDW: 14.5 % (ref 11.5–15.5)
WBC: 8.7 10*3/uL (ref 4.0–10.5)
nRBC: 0 % (ref 0.0–0.2)

## 2021-12-25 LAB — URINALYSIS, ROUTINE W REFLEX MICROSCOPIC
Bilirubin Urine: NEGATIVE
Glucose, UA: 250 mg/dL — AB
Hgb urine dipstick: NEGATIVE
Ketones, ur: NEGATIVE mg/dL
Leukocytes,Ua: NEGATIVE
Nitrite: NEGATIVE
Protein, ur: NEGATIVE mg/dL
Specific Gravity, Urine: 1.012 (ref 1.005–1.030)
pH: 6 (ref 5.0–8.0)

## 2021-12-25 LAB — COMPREHENSIVE METABOLIC PANEL
ALT: 6 U/L (ref 0–44)
AST: 16 U/L (ref 15–41)
Albumin: 3.5 g/dL (ref 3.5–5.0)
Alkaline Phosphatase: 55 U/L (ref 38–126)
Anion gap: 14 (ref 5–15)
BUN: 23 mg/dL (ref 8–23)
CO2: 27 mmol/L (ref 22–32)
Calcium: 9.7 mg/dL (ref 8.9–10.3)
Chloride: 99 mmol/L (ref 98–111)
Creatinine, Ser: 0.97 mg/dL (ref 0.61–1.24)
GFR, Estimated: >60 mL/min (ref >60)
Glucose, Bld: 199 mg/dL — ABNORMAL HIGH (ref 70–99)
Potassium: 3.3 mmol/L — ABNORMAL LOW (ref 3.5–5.1)
Sodium: 140 mmol/L (ref 135–145)
Total Bilirubin: 0.6 mg/dL (ref 0.3–1.2)
Total Protein: 6.8 g/dL (ref 6.5–8.1)

## 2021-12-25 LAB — LACTIC ACID, PLASMA
Lactic Acid, Venous: 2 mmol/L (ref 0.5–1.9)
Lactic Acid, Venous: 1 mmol/L (ref 0.5–1.9)

## 2021-12-25 MED ORDER — SODIUM CHLORIDE 0.9 % IV SOLN: INTRAVENOUS | Status: DC

## 2021-12-25 MED ORDER — SODIUM CHLORIDE 0.9 % IV BOLUS
500.0000 mL | Freq: Once | INTRAVENOUS | Status: AC
  Administered 2021-12-25: 500 mL via INTRAVENOUS

## 2021-12-25 NOTE — Telephone Encounter (Signed)
Pt's daughter [name] called to let us know pt is at [facility] ED and would like to proceed with toe amputation. Pt was in yesterday to see MD and was not amenable to this at that time, but has since reconsidered and wishes to proceed. They do have questions they wish to discuss with MD. MD has been made aware by staff message.

## 2021-12-25 NOTE — ED Provider Notes (Signed)
[facility] EMERGENCY DEPT Provider Note   CSN: [id number] Arrival date & time: 12/25/21  3716     History  Chief Complaint  Patient presents with   Wound Infection   Osteomyelitis     Harold [name] is a 88 y.o. male.  Patient presenting for right foot wound and known to have osteomyelitis of the right great toe.  Patient apparently spiked a fever to 102 yesterday at 1745.  Given Motrin 1800 and has not since.  Had no fever last evening no fever today.  Patient is followed by Dr. Luan Pulling from vascular surgery patient is followed by infectious disease it comes as well.  Patient had been on Doxy and cefadranil, according to infectious disease visit on 28 June it implied that they intended for him to continue that.  But patient did not eat family was not aware they were supposed to do it was a bit confusing.  So he is currently been off antibiotics since the end of June.  Both his vascular surgeon and infectious disease have recommended amputation of that toe and patient has refused to do that up to this point in time.  Patient currently without any significant complaints.  Temp upon arrival here 97.9 blood pressure 109/54 went up to 156/61 heart rate 78 and respirations 18 patient sats are 100% on room air.  Was just seen by vascular surgery yesterday.  They did again recommend amputation.  But patient refused.  And has started wound therapy and had some debridement and and some cauterization of the wound and they started hyperbaric therapy had his first treatment of this on Monday yesterday.  Past medical history is significant for coronary disease hyperlipidemia type 2 diabetes gastroesophageal reflux disease.  Patient is a former smoker.       Home Medications Prior to Admission medications   Medication Sig Start Date End Date Taking? Authorizing Provider  amLODipine (NORVASC) 5 MG tablet Take 5 mg by mouth daily. 08/14/21  Yes [provider]  aspirin EC 81  MG tablet Take 81 mg by mouth daily.   Yes [provider]  Cholecalciferol (VITAMIN D3) 50 MCG (2000 UT) TABS Take 2,000 Units by mouth daily.   Yes [provider]  dorzolamide-timolol (COSOPT) 22.3-6.8 MG/ML ophthalmic solution 1 drop 2 (two) times daily. 08/08/21  Yes [provider]  gabapentin (NEURONTIN) 100 MG capsule Take 100 mg by mouth 3 (three) times daily. 09/09/21  Yes [provider]  latanoprost (XALATAN) 0.005 % ophthalmic solution Place 1 drop into both eyes at bedtime. 09/17/21  Yes [provider]  lisinopril-hydrochlorothiazide (ZESTORETIC) 20-25 MG tablet Take 1 tablet by mouth daily. 06/16/21  Yes [provider]  metFORMIN (GLUCOPHAGE) 500 MG tablet Take 500 mg by mouth 2 (two) times daily with a meal.   Yes [provider]  pantoprazole (PROTONIX) 40 MG tablet Take 40 mg by mouth daily. 08/26/21  Yes [provider]  SANTYL 250 UNIT/GM ointment Apply topically daily. 11/05/21  Yes [provider]  simvastatin (ZOCOR) 80 MG tablet Take 40 mg by mouth at bedtime.   Yes [provider]  ACCU-CHEK AVIVA PLUS test strip  08/30/21   [provider]  Accu-Chek Softclix Lancets lancets SMARTSIG:Topical 06/06/21   [provider]  lidocaine (XYLOCAINE) 2 % jelly Apply 1 application. topically 3 (three) times daily as needed. 08/23/21   [provider]  lidocaine (XYLOCAINE) 5 % ointment 1 application. daily as needed for mild pain or  moderate pain. 08/28/21   [provider]      Allergies    Patient has no known allergies.    Review of Systems   Review of Systems  Constitutional:  Positive for fever. Negative for chills.  HENT:  Negative for ear pain and sore throat.   Eyes:  Negative for pain and visual disturbance.  Respiratory:  Negative for cough and shortness of breath.   Cardiovascular:  Negative for chest pain and palpitations.  Gastrointestinal:   Negative for abdominal pain and vomiting.  Genitourinary:  Negative for dysuria and hematuria.  Musculoskeletal:  Negative for arthralgias and back pain.  Skin:  Positive for wound. Negative for color change and rash.  Neurological:  Negative for seizures and syncope.  All other systems reviewed and are negative.   Physical Exam Updated Vital Signs BP (!) 148/63   Pulse (!) 51   Temp 97.9 F (36.6 C)   Resp 16   Ht 1.753 m ('5\' 9"'$ )   Wt 56.7 kg   SpO2 99%   BMI 18.46 kg/m  Physical Exam Vitals and nursing note reviewed.  Constitutional:      General: He is not in acute distress.    Appearance: Normal appearance. He is well-developed.  HENT:     Head: Normocephalic and atraumatic.  Eyes:     Extraocular Movements: Extraocular movements intact.     Conjunctiva/sclera: Conjunctivae normal.     Pupils: Pupils are equal, round, and reactive to light.  Cardiovascular:     Rate and Rhythm: Normal rate and regular rhythm.     Heart sounds: No murmur heard. Pulmonary:     Effort: Pulmonary effort is normal. No respiratory distress.     Breath sounds: Normal breath sounds.  Abdominal:     Palpations: Abdomen is soft.     Tenderness: There is no abdominal tenderness.  Musculoskeletal:        General: No swelling.     Cervical back: Neck supple.     Right lower leg: No edema.     Left lower leg: No edema.     Comments: Right great toe with wound but no significant swelling cap refill present.  No purulent discharge.  Skin:    General: Skin is warm and dry.     Capillary Refill: Capillary refill takes less than 2 seconds.  Neurological:     General: No focal deficit present.     Mental Status: He is alert. Mental status is at baseline.  Psychiatric:        Mood and Affect: Mood normal.     ED Results / Procedures / Treatments   Labs (all labs ordered are listed, but only abnormal results are displayed) Labs Reviewed  LACTIC ACID, PLASMA - Abnormal; Notable for the  following components:      Result Value   Lactic Acid, Venous 2.0 (*)    All other components within normal limits  CBC WITH DIFFERENTIAL/PLATELET - Abnormal; Notable for the following components:   RBC 3.88 (*)    Hemoglobin 11.9 (*)    HCT 36.4 (*)    All other components within normal limits  COMPREHENSIVE METABOLIC PANEL - Abnormal; Notable for the following components:   Potassium 3.3 (*)    Glucose, Bld 199 (*)    All other components within normal limits  URINALYSIS, ROUTINE W REFLEX MICROSCOPIC - Abnormal; Notable for the following components:   Glucose, UA 250 (*)    All other components within normal  limits  CULTURE, BLOOD (ROUTINE X 2)  CULTURE, BLOOD (ROUTINE X 2)  LACTIC ACID, PLASMA    EKG None  Radiology DG Chest Port 1 View  Result Date: 12/25/2021 CLINICAL DATA:  Fever and chills EXAM: PORTABLE CHEST - 1 VIEW COMPARISON:  Chest radiograph 11/28/2021 Chest CT 05/29/2020 FINDINGS: Cardiomediastinal silhouette and pulmonary vasculature are within normal limits. Post surgical changes of CABG. Lungs are hyperexpanded. Unchanged chronic right pleural effusion adjacent atelectasis. IMPRESSION: 1. No acute cardiopulmonary process. 2. Unchanged right basilar opacity likely a combination of chronic small pleural effusion with adjacent atelectasis. Electronically Signed   By: Miachel Roux M.D.   On: 12/25/2021 12:51   DG Foot Complete Left  Result Date: 12/25/2021 CLINICAL DATA:  Wound infection, evaluate for osteomyelitis EXAM: LEFT FOOT - COMPLETE 3+ VIEW COMPARISON:  None Available. FINDINGS: Soft tissue wound along the medial aspect of the first MTP joint. Erosive changes at the medial base of the first proximal phalanx concerning for osteomyelitis. No acute fracture or dislocation. Mild osteoarthritis of the first MTP joint. Peripheral vascular atherosclerotic disease. IMPRESSION: 1. Soft tissue wound along the medial aspect of the first MTP joint. Erosive changes at the  medial base of the first proximal phalanx concerning for osteomyelitis. 2. Mild osteoarthritis of the first MTP joint. 3. Peripheral vascular atherosclerotic disease. Electronically Signed   By: Kathreen Devoid M.D.   On: 12/25/2021 11:01    Procedures Procedures    Medications Ordered in ED Medications  0.9 %  sodium chloride infusion ( Intravenous New Bag/Given 12/25/21 1231)  sodium chloride 0.9 % bolus 500 mL (500 mLs Intravenous New Bag/Given 12/25/21 1232)    ED Course/ Medical Decision Making/ A&P                           Medical Decision Making Amount and/or Complexity of Data Reviewed Labs: ordered. Radiology: ordered.  Risk Prescription drug management.   Work-up was for the fevers.  Urinalysis negative lactic acid first 1 was 2 repeat was 1 no leukocytosis CBC 8.7 hemoglobin 11.9.  Complete metabolic panel only significant for mild hypokalemia with potassium 3.3.  No acidosis liver function tests normal.  Normal gap.  Cultures were sent and are pending.  Chest x-ray no acute cardiopulmonary process.  Unchanged right basilar opacity from the past.  X-ray of the foot still soft tissue wound along the medial aspect of the first MTP joint erosive changes at the medial base of the first proximal phalanx concerning for osteomyelitis which was noted.  No other significant findings.  Patient was waiting on second lactic acid which came down to 1 which is overall reassuring.  No fevers here.  They did contact his vascular surgeon Dr. Luan Pulling because patient decided that he wanted to go and have the toe amputated because he was tired of having to deal with all of his wound care stuff.  The daughter called they have him scheduled to have the foot removed tomorrow.  Recommending that he take a dose of his antibiotics that he has at home for the osteomyelitis.  Patient stable for discharge home.  No evidence of any significant infection. Final Clinical Impression(s) / ED Diagnoses Final  diagnoses:  Fever, unspecified fever cause  Osteomyelitis of right foot, unspecified type [facility])    Rx / DC Orders ED Discharge Orders     None         Fredia Sorrow, MD 12/25/21 1612

## 2021-12-25 NOTE — Progress Notes (Signed)
PCP - Dr Josetta Huddle Cardiologist - Dr Ellyn Hack, Last OV 12/20/21  Chest x-ray - 12/25/21 (1V) EKG - 12/16/21 Stress Test - n/a ECHO - 12/18/21  Cardiac Cath - 03/01/13  ICD Pacemaker/Loop - n/a  Sleep Study -  n/a CPAP - none  Do not take Metformin on the morning of surgery.  If your blood sugar is less than 70 mg/dL, you will need to treat for low blood sugar: Treat a low blood sugar (less than 70 mg/dL) with  cup of clear juice (cranberry or apple), 4 glucose tablets, OR glucose gel. Recheck blood sugar in 15 minutes after treatment (to make sure it is greater than 70 mg/dL). If your blood sugar is not greater than 70 mg/dL on recheck, call [phone] for further   Aspirin Instructions: Follow your surgeon's instructions on when to stop aspirin prior to surgery,  If no instructions were given by your surgeon then you will need to call the office for those instructions.  Anesthesia review: Yes, via phone call-late add-on surgical case. Dr Therisa Doyne  STOP now taking any Aspirin (unless otherwise instructed by your surgeon), Aleve, Naproxen, Ibuprofen, Motrin, Advil, Goody's, BC's, all herbal medications, fish oil, and all vitamins.   Coronavirus Screening Do you have any of the following symptoms:  Cough yes/no: No Fever (>100.80F)  yes/no: No Runny nose yes/no: No Sore throat yes/no: No Difficulty breathing/shortness of breath  yes/no: No  Have you traveled in the last 14 days and where? yes/no: No  Daughter Harold [name] verbalized understanding of instructions that were given via phone.

## 2021-12-25 NOTE — Anesthesia Preprocedure Evaluation (Signed)
Anesthesia Evaluation  Patient identified by MRN, date of birth, ID band Patient awake    Reviewed: Allergy & Precautions, NPO status , Patient's Chart, lab work & pertinent test results  History of Anesthesia Complications (+) PONV and history of anesthetic complications  Airway Mallampati: II  TM Distance: >3 FB Neck ROM: Full    Dental  (+) Edentulous Upper, Missing, Chipped, Poor Dentition, Partial Lower   Pulmonary neg pulmonary ROS, former smoker,    Pulmonary exam normal breath sounds clear to auscultation       Cardiovascular hypertension, Pt. on medications + CAD, + Past MI, + Cardiac Stents, + CABG and + Peripheral Vascular Disease   Rhythm:Regular Rate:Normal  Echo 12/2021 1. Left ventricular ejection fraction, by estimation, is 55 to 60%. The left ventricle has normal function. The left ventricle has no regional wall motion abnormalities. There is mild left ventricular hypertrophy. Left ventricular diastolic parameters are consistent with Grade I diastolic dysfunction (impaired relaxation).  2. Right ventricular systolic function is normal. The right ventricular size is not well visualized. Tricuspid regurgitation signal is inadequate  for assessing PA pressure.  3. The mitral valve is normal in structure. Trivial mitral valve regurgitation. No evidence of mitral stenosis.  4. The aortic valve is tricuspid. There is moderate calcification of the aortic valve. Aortic valve regurgitation is not visualized. No aortic stenosis is present.  5. The inferior vena cava is normal in size with greater than 50% respiratory variability, suggesting right atrial pressure of 3 mmHg.  6. Technically difficult study with poor acoustic windows.    Neuro/Psych negative neurological ROS  negative psych ROS   GI/Hepatic Neg liver ROS, GERD  ,  Endo/Other  diabetes, Type 2  Renal/GU negative Renal ROS     Musculoskeletal  (+)  Arthritis ,   Abdominal   Peds negative pediatric ROS (+)  Hematology negative hematology ROS (+)   Anesthesia Other Findings   Reproductive/Obstetrics negative OB ROS                           Anesthesia Physical  Anesthesia Plan  ASA: 3  Anesthesia Plan: MAC   Post-op Pain Management: Ofirmev IV (intra-op)* and Regional block*   Induction: Intravenous  PONV Risk Score and Plan: 3 and Ondansetron, Dexamethasone, Treatment may vary due to age or medical condition, Propofol infusion and TIVA  Airway Management Planned: Natural Airway  Additional Equipment:   Intra-op Plan:   Post-operative Plan:   Informed Consent: I have reviewed the patients History and Physical, chart, labs and discussed the procedure including the risks, benefits and alternatives for the proposed anesthesia with the patient or authorized representative who has indicated his/her understanding and acceptance.     Dental advisory given  Plan Discussed with: CRNA  Anesthesia Plan Comments:        Anesthesia Quick Evaluation

## 2021-12-25 NOTE — Discharge Instructions (Signed)
Take a dose of the antibiotics he has at home today.  Follow-up with Dr. Luan Pulling for the admission tomorrow for the amputation of the foot.  Would recommend following up with [facility] for any new or worse symptoms in the meantime.  Today's work-up without any acute findings.  Other than the known osteomyelitis under the right great toe.

## 2021-12-25 NOTE — Telephone Encounter (Signed)
Patient's daughter called and stated that Harold [name] was experiencing fevers last night. Highest recorded temperature was 102.2. Patient took Motrin last night. She states he no longer has a fever this morning, but is still experiencing weakness and chills.   Advised patient's daughter to take Harold [name] to the emergency department for further evaluation. She says she will take him to the [place] ED today.   Beryle Flock, RN

## 2021-12-25 NOTE — ED Triage Notes (Signed)
Pt has had a wound to a bunion on his right foot since March.Pt was diagnosed with osteomyelitis and completed abx about two weeks ago. Daughter states he spiked a fever of 102.2 last night.

## 2021-12-26 ENCOUNTER — Observation Stay (HOSPITAL_COMMUNITY)
Admission: RE | Admit: 2021-12-26 | Discharge: 2021-12-27 | Disposition: A | Discharge status: Home / Self Care | Payer: Medicare Other | Attending: Vascular Surgery | Admitting: Vascular Surgery

## 2021-12-26 ENCOUNTER — Inpatient Hospital Stay (HOSPITAL_COMMUNITY): Payer: Medicare Other

## 2021-12-26 ENCOUNTER — Encounter (HOSPITAL_COMMUNITY): Payer: Self-pay | Admitting: Vascular Surgery

## 2021-12-26 ENCOUNTER — Encounter (HOSPITAL_COMMUNITY)
Admission: RE | Disposition: A | Discharge status: Home / Self Care | Payer: Self-pay | Source: Home / Self Care | Attending: Vascular Surgery

## 2021-12-26 ENCOUNTER — Inpatient Hospital Stay (HOSPITAL_BASED_OUTPATIENT_CLINIC_OR_DEPARTMENT_OTHER): Payer: Medicare Other

## 2021-12-26 ENCOUNTER — Other Ambulatory Visit: Payer: Self-pay

## 2021-12-26 DIAGNOSIS — Z87891 Personal history of nicotine dependence: Secondary | ICD-10-CM | POA: Insufficient documentation

## 2021-12-26 DIAGNOSIS — I10 Essential (primary) hypertension: Secondary | ICD-10-CM | POA: Diagnosis not present

## 2021-12-26 DIAGNOSIS — M869 Osteomyelitis, unspecified: Secondary | ICD-10-CM

## 2021-12-26 DIAGNOSIS — M86172 Other acute osteomyelitis, left ankle and foot: Principal | ICD-10-CM | POA: Insufficient documentation

## 2021-12-26 DIAGNOSIS — M6281 Muscle weakness (generalized): Secondary | ICD-10-CM | POA: Insufficient documentation

## 2021-12-26 DIAGNOSIS — E1151 Type 2 diabetes mellitus with diabetic peripheral angiopathy without gangrene: Secondary | ICD-10-CM | POA: Diagnosis not present

## 2021-12-26 DIAGNOSIS — I2510 Atherosclerotic heart disease of native coronary artery without angina pectoris: Secondary | ICD-10-CM | POA: Diagnosis not present

## 2021-12-26 DIAGNOSIS — R2689 Other abnormalities of gait and mobility: Secondary | ICD-10-CM | POA: Insufficient documentation

## 2021-12-26 DIAGNOSIS — Z79899 Other long term (current) drug therapy: Secondary | ICD-10-CM | POA: Diagnosis not present

## 2021-12-26 DIAGNOSIS — Z7982 Long term (current) use of aspirin: Secondary | ICD-10-CM | POA: Insufficient documentation

## 2021-12-26 DIAGNOSIS — I96 Gangrene, not elsewhere classified: Secondary | ICD-10-CM | POA: Diagnosis not present

## 2021-12-26 DIAGNOSIS — Z7984 Long term (current) use of oral hypoglycemic drugs: Secondary | ICD-10-CM

## 2021-12-26 DIAGNOSIS — M7989 Other specified soft tissue disorders: Secondary | ICD-10-CM | POA: Diagnosis not present

## 2021-12-26 DIAGNOSIS — L03032 Cellulitis of left toe: Secondary | ICD-10-CM | POA: Insufficient documentation

## 2021-12-26 DIAGNOSIS — E1169 Type 2 diabetes mellitus with other specified complication: Secondary | ICD-10-CM

## 2021-12-26 DIAGNOSIS — E119 Type 2 diabetes mellitus without complications: Secondary | ICD-10-CM | POA: Insufficient documentation

## 2021-12-26 DIAGNOSIS — I739 Peripheral vascular disease, unspecified: Secondary | ICD-10-CM | POA: Insufficient documentation

## 2021-12-26 DIAGNOSIS — Z955 Presence of coronary angioplasty implant and graft: Secondary | ICD-10-CM | POA: Insufficient documentation

## 2021-12-26 DIAGNOSIS — M1990 Unspecified osteoarthritis, unspecified site: Secondary | ICD-10-CM | POA: Diagnosis not present

## 2021-12-26 DIAGNOSIS — Z951 Presence of aortocoronary bypass graft: Secondary | ICD-10-CM | POA: Insufficient documentation

## 2021-12-26 DIAGNOSIS — S98139A Complete traumatic amputation of one unspecified lesser toe, initial encounter: Principal | ICD-10-CM

## 2021-12-26 DIAGNOSIS — L97529 Non-pressure chronic ulcer of other part of left foot with unspecified severity: Secondary | ICD-10-CM | POA: Diagnosis not present

## 2021-12-26 HISTORY — PX: AMPUTATION: SHX166

## 2021-12-26 LAB — GLUCOSE, CAPILLARY
Glucose-Capillary: 122 mg/dL — ABNORMAL HIGH (ref 70–99)
Glucose-Capillary: 125 mg/dL — ABNORMAL HIGH (ref 70–99)

## 2021-12-26 SURGERY — AMPUTATION, FOOT, RAY
Anesthesia: Monitor Anesthesia Care | Site: Toe | Laterality: Left

## 2021-12-26 MED ORDER — CEFAZOLIN SODIUM-DEXTROSE 2-4 GM/100ML-% IV SOLN
2.0000 g | Freq: Three times a day (TID) | INTRAVENOUS | Status: AC
  Administered 2021-12-27 (×2): 2 g via INTRAVENOUS
  Filled 2021-12-26 (×2): qty 100

## 2021-12-26 MED ORDER — DEXMEDETOMIDINE HCL IN NACL 80 MCG/20ML IV SOLN
INTRAVENOUS | Status: AC
  Filled 2021-12-26: qty 20

## 2021-12-26 MED ORDER — DOCUSATE SODIUM 100 MG PO CAPS
100.0000 mg | ORAL_CAPSULE | Freq: Every day | ORAL | Status: DC
  Administered 2021-12-27: 100 mg via ORAL
  Filled 2021-12-26: qty 1

## 2021-12-26 MED ORDER — INSULIN ASPART 100 UNIT/ML IJ SOLN
0.0000 [IU] | Freq: Three times a day (TID) | INTRAMUSCULAR | Status: DC
  Administered 2021-12-27: 2 [IU] via SUBCUTANEOUS

## 2021-12-26 MED ORDER — MORPHINE SULFATE (PF) 2 MG/ML IV SOLN: 2.0000 mg | INTRAVENOUS | Status: DC | PRN

## 2021-12-26 MED ORDER — HEPARIN SODIUM (PORCINE) 5000 UNIT/ML IJ SOLN
5000.0000 [IU] | Freq: Three times a day (TID) | INTRAMUSCULAR | Status: DC
  Administered 2021-12-27 (×2): 5000 [IU] via SUBCUTANEOUS
  Filled 2021-12-26 (×2): qty 1

## 2021-12-26 MED ORDER — CEFAZOLIN SODIUM 1 G IJ SOLR
INTRAMUSCULAR | Status: AC
  Filled 2021-12-26: qty 20

## 2021-12-26 MED ORDER — FENTANYL CITRATE (PF) 100 MCG/2ML IJ SOLN: 50.0000 ug | Freq: Once | INTRAMUSCULAR | Status: AC

## 2021-12-26 MED ORDER — PROPOFOL 500 MG/50ML IV EMUL
INTRAVENOUS | Status: DC | PRN
  Administered 2021-12-26: 100 ug/kg/min via INTRAVENOUS

## 2021-12-26 MED ORDER — HYDROCHLOROTHIAZIDE 25 MG PO TABS
25.0000 mg | ORAL_TABLET | Freq: Every day | ORAL | Status: DC
  Administered 2021-12-27: 25 mg via ORAL
  Filled 2021-12-26 (×2): qty 1

## 2021-12-26 MED ORDER — CHLORHEXIDINE GLUCONATE 4 % EX LIQD: 60.0000 mL | Freq: Once | CUTANEOUS | Status: DC

## 2021-12-26 MED ORDER — AMLODIPINE BESYLATE 5 MG PO TABS
5.0000 mg | ORAL_TABLET | Freq: Every day | ORAL | Status: DC
  Administered 2021-12-27: 5 mg via ORAL
  Filled 2021-12-26: qty 1

## 2021-12-26 MED ORDER — LABETALOL HCL 5 MG/ML IV SOLN: 10.0000 mg | INTRAVENOUS | Status: DC | PRN

## 2021-12-26 MED ORDER — LISINOPRIL 20 MG PO TABS
20.0000 mg | ORAL_TABLET | Freq: Every day | ORAL | Status: DC
  Administered 2021-12-27: 20 mg via ORAL
  Filled 2021-12-26: qty 1

## 2021-12-26 MED ORDER — HYDROCODONE-ACETAMINOPHEN 5-325 MG PO TABS
1.0000 | ORAL_TABLET | ORAL | Status: DC | PRN
  Administered 2021-12-27: 2 via ORAL
  Filled 2021-12-26: qty 2

## 2021-12-26 MED ORDER — ATORVASTATIN CALCIUM 40 MG PO TABS
40.0000 mg | ORAL_TABLET | Freq: Every day | ORAL | Status: DC
  Administered 2021-12-27: 40 mg via ORAL
  Filled 2021-12-26 (×2): qty 1

## 2021-12-26 MED ORDER — GABAPENTIN 100 MG PO CAPS
100.0000 mg | ORAL_CAPSULE | Freq: Three times a day (TID) | ORAL | Status: DC
  Administered 2021-12-27: 100 mg via ORAL
  Filled 2021-12-26: qty 1

## 2021-12-26 MED ORDER — ALUM & MAG HYDROXIDE-SIMETH 200-200-20 MG/5ML PO SUSP: 15.0000 mL | ORAL | Status: DC | PRN

## 2021-12-26 MED ORDER — ASPIRIN 81 MG PO TBEC
81.0000 mg | DELAYED_RELEASE_TABLET | Freq: Every day | ORAL | Status: DC
  Administered 2021-12-27: 81 mg via ORAL
  Filled 2021-12-26: qty 1

## 2021-12-26 MED ORDER — ROPIVACAINE HCL 5 MG/ML IJ SOLN
INTRAMUSCULAR | Status: DC | PRN
  Administered 2021-12-26: 30 mL via PERINEURAL

## 2021-12-26 MED ORDER — FENTANYL CITRATE (PF) 250 MCG/5ML IJ SOLN
INTRAMUSCULAR | Status: AC
  Filled 2021-12-26: qty 5

## 2021-12-26 MED ORDER — CLONIDINE HCL (ANALGESIA) 100 MCG/ML EP SOLN
EPIDURAL | Status: DC | PRN
  Administered 2021-12-26: 70 ug

## 2021-12-26 MED ORDER — BACITRACIN ZINC 500 UNIT/GM EX OINT
TOPICAL_OINTMENT | CUTANEOUS | Status: AC
  Filled 2021-12-26: qty 28.35

## 2021-12-26 MED ORDER — BACITRACIN ZINC 500 UNIT/GM EX OINT
TOPICAL_OINTMENT | CUTANEOUS | Status: DC | PRN
  Administered 2021-12-26: 1 via TOPICAL

## 2021-12-26 MED ORDER — LISINOPRIL-HYDROCHLOROTHIAZIDE 20-25 MG PO TABS: 1.0000 | ORAL_TABLET | Freq: Every day | ORAL | Status: DC

## 2021-12-26 MED ORDER — GUAIFENESIN-DM 100-10 MG/5ML PO SYRP: 15.0000 mL | ORAL_SOLUTION | ORAL | Status: DC | PRN

## 2021-12-26 MED ORDER — DROPERIDOL 2.5 MG/ML IJ SOLN
0.6250 mg | Freq: Once | INTRAMUSCULAR | Status: DC | PRN
Start: 2021-12-26 — End: 2021-12-26

## 2021-12-26 MED ORDER — ACETAMINOPHEN 325 MG PO TABS: 325.0000 mg | ORAL_TABLET | Freq: Four times a day (QID) | ORAL | Status: DC | PRN

## 2021-12-26 MED ORDER — POTASSIUM CHLORIDE CRYS ER 20 MEQ PO TBCR
20.0000 meq | EXTENDED_RELEASE_TABLET | Freq: Every day | ORAL | Status: AC | PRN
  Administered 2021-12-27: 40 meq via ORAL
  Filled 2021-12-26: qty 2

## 2021-12-26 MED ORDER — ONDANSETRON HCL 4 MG/2ML IJ SOLN: 4.0000 mg | Freq: Four times a day (QID) | INTRAMUSCULAR | Status: DC | PRN

## 2021-12-26 MED ORDER — 0.9 % SODIUM CHLORIDE (POUR BTL) OPTIME
TOPICAL | Status: DC | PRN
  Administered 2021-12-26: 1000 mL

## 2021-12-26 MED ORDER — HYDRALAZINE HCL 20 MG/ML IJ SOLN: 5.0000 mg | INTRAMUSCULAR | Status: DC | PRN

## 2021-12-26 MED ORDER — HYDROCODONE-ACETAMINOPHEN 7.5-325 MG PO TABS: 1.0000 | ORAL_TABLET | ORAL | Status: DC | PRN

## 2021-12-26 MED ORDER — FENTANYL CITRATE (PF) 100 MCG/2ML IJ SOLN: 25.0000 ug | INTRAMUSCULAR | Status: DC | PRN

## 2021-12-26 MED ORDER — SODIUM CHLORIDE 0.9 % IV SOLN: INTRAVENOUS | Status: AC

## 2021-12-26 MED ORDER — METOPROLOL TARTRATE 5 MG/5ML IV SOLN: 2.0000 mg | INTRAVENOUS | Status: DC | PRN

## 2021-12-26 MED ORDER — MAGNESIUM SULFATE 2 GM/50ML IV SOLN: 2.0000 g | Freq: Every day | INTRAVENOUS | Status: DC | PRN

## 2021-12-26 MED ORDER — CEFAZOLIN SODIUM-DEXTROSE 2-4 GM/100ML-% IV SOLN
2.0000 g | INTRAVENOUS | Status: AC
  Administered 2021-12-26: 2 g via INTRAVENOUS

## 2021-12-26 MED ORDER — PHENYLEPHRINE 80 MCG/ML (10ML) SYRINGE FOR IV PUSH (FOR BLOOD PRESSURE SUPPORT)
PREFILLED_SYRINGE | INTRAVENOUS | Status: DC | PRN
  Administered 2021-12-26: 80 ug via INTRAVENOUS
  Administered 2021-12-26: 160 ug via INTRAVENOUS
  Administered 2021-12-26: 80 ug via INTRAVENOUS
  Administered 2021-12-26: 160 ug via INTRAVENOUS

## 2021-12-26 MED ORDER — DEXAMETHASONE SODIUM PHOSPHATE 4 MG/ML IJ SOLN
INTRAMUSCULAR | Status: DC | PRN
  Administered 2021-12-26: 5 mg via PERINEURAL

## 2021-12-26 MED ORDER — SODIUM CHLORIDE 0.9 % IV SOLN: INTRAVENOUS | Status: DC

## 2021-12-26 MED ORDER — ONDANSETRON HCL 4 MG/2ML IJ SOLN
INTRAMUSCULAR | Status: DC | PRN
  Administered 2021-12-26: 4 mg via INTRAVENOUS

## 2021-12-26 MED ORDER — PANTOPRAZOLE SODIUM 40 MG PO TBEC
40.0000 mg | DELAYED_RELEASE_TABLET | Freq: Every day | ORAL | Status: DC
  Administered 2021-12-27: 40 mg via ORAL
  Filled 2021-12-26: qty 1

## 2021-12-26 MED ORDER — FENTANYL CITRATE (PF) 100 MCG/2ML IJ SOLN
INTRAMUSCULAR | Status: AC
  Administered 2021-12-26: 50 ug via INTRAVENOUS
  Filled 2021-12-26: qty 2

## 2021-12-26 MED ORDER — PHENOL 1.4 % MT LIQD: 1.0000 | OROMUCOSAL | Status: DC | PRN

## 2021-12-26 SURGICAL SUPPLY — 35 items
BAG COUNTER SPONGE SURGICOUNT (BAG) ×2 IMPLANT
BLADE SAW SGTL NAR THIN XSHT (BLADE) ×2 IMPLANT
BLADE SURG 21 STRL SS (BLADE) ×2 IMPLANT
BNDG CONFORM 3 STRL LF (GAUZE/BANDAGES/DRESSINGS) ×2 IMPLANT
BNDG ELASTIC 4X5.8 VLCR STR LF (GAUZE/BANDAGES/DRESSINGS) ×2 IMPLANT
BNDG GAUZE ELAST 4 BULKY (GAUZE/BANDAGES/DRESSINGS) ×2 IMPLANT
CANISTER SUCT 3000ML PPV (MISCELLANEOUS) ×2 IMPLANT
COVER SURGICAL LIGHT HANDLE (MISCELLANEOUS) ×2 IMPLANT
DRAPE EXTREMITY T 121X128X90 (DISPOSABLE) ×1 IMPLANT
DRAPE HALF SHEET 40X57 (DRAPES) ×3 IMPLANT
DRSG ADAPTIC 3X8 NADH LF (GAUZE/BANDAGES/DRESSINGS) ×2 IMPLANT
ELECT REM PT RETURN 9FT ADLT (ELECTROSURGICAL) ×2
ELECTRODE REM PT RTRN 9FT ADLT (ELECTROSURGICAL) ×1 IMPLANT
GAUZE SPONGE 4X4 12PLY STRL (GAUZE/BANDAGES/DRESSINGS) ×2 IMPLANT
GLOVE BIO SURGEON STRL SZ8 (GLOVE) ×1 IMPLANT
GLOVE BIOGEL PI IND STRL 6.5 (GLOVE) IMPLANT
GLOVE BIOGEL PI INDICATOR 6.5 (GLOVE) ×1
GLOVE SURG SS PI 8.0 STRL IVOR (GLOVE) ×3 IMPLANT
GOWN STRL REUS W/ TWL LRG LVL3 (GOWN DISPOSABLE) ×3 IMPLANT
GOWN STRL REUS W/TWL LRG LVL3 (GOWN DISPOSABLE) ×3
KIT BASIN OR (CUSTOM PROCEDURE TRAY) ×2 IMPLANT
KIT TURNOVER KIT B (KITS) ×2 IMPLANT
NS IRRIG 1000ML POUR BTL (IV SOLUTION) ×2 IMPLANT
PACK GENERAL/GYN (CUSTOM PROCEDURE TRAY) ×2 IMPLANT
PAD ABD 8X10 STRL (GAUZE/BANDAGES/DRESSINGS) ×2 IMPLANT
PAD ARMBOARD 7.5X6 YLW CONV (MISCELLANEOUS) ×4 IMPLANT
SPECIMEN JAR SMALL (MISCELLANEOUS) ×2 IMPLANT
SUT ETHILON 2 0 PSLX (SUTURE) ×4 IMPLANT
SUT ETHILON 3 0 PS 1 (SUTURE) ×2 IMPLANT
SUT VIC AB 2-0 CT1 27 (SUTURE) ×2
SUT VIC AB 2-0 CT1 TAPERPNT 27 (SUTURE) IMPLANT
TOWEL GREEN STERILE (TOWEL DISPOSABLE) ×4 IMPLANT
TOWEL GREEN STERILE FF (TOWEL DISPOSABLE) ×2 IMPLANT
UNDERPAD 30X36 HEAVY ABSORB (UNDERPADS AND DIAPERS) ×2 IMPLANT
WATER STERILE IRR 1000ML POUR (IV SOLUTION) ×2 IMPLANT

## 2021-12-26 NOTE — Anesthesia Procedure Notes (Signed)
Anesthesia Regional Block: Popliteal block   Pre-Anesthetic Checklist: , timeout performed,  Correct Patient, Correct Site, Correct Laterality,  Correct Procedure, Correct Position, site marked,  Risks and benefits discussed,  Surgical consent,  Pre-op evaluation,  At surgeon's request and post-op pain management  Laterality: Lower and Left  Prep: chloraprep       Needles:  Injection technique: Single-shot  Needle Type: Stimiplex     Needle Length: 10cm  Needle Gauge: 21     Additional Needles:   Procedures:,,,, ultrasound used (permanent image in chart),,   Motor weakness within 5 minutes.  Narrative:  Start time: 12/26/2021 1:15 PM End time: 12/26/2021 1:35 PM Injection made incrementally with aspirations every 5 mL.  Performed by: Personally  Anesthesiologist: Nolon Nations, MD  Additional Notes: Nerve located and needle positioned with direct ultrasound guidance. Good perineural spread. Patient tolerated well.

## 2021-12-26 NOTE — Anesthesia Procedure Notes (Signed)
Procedure Name: MAC Date/Time: 12/26/2021 3:00 PM  Performed by: Janace Litten, CRNAPre-anesthesia Checklist: Patient identified, Emergency Drugs available, Suction available, Patient being monitored and Timeout performed Patient Re-evaluated:Patient Re-evaluated prior to induction Oxygen Delivery Method: Simple face mask Dental Injury: Teeth and Oropharynx as per pre-operative assessment

## 2021-12-26 NOTE — Op Note (Signed)
DATE OF SERVICE: 12/26/2021  PATIENT:  Harold [name]  88 y.o. male  PRE-OPERATIVE DIAGNOSIS:  left great toe osteomyelitis after revascularization  POST-OPERATIVE DIAGNOSIS:  Same  PROCEDURE:   Left great toe partial ray amputation  SURGEON:  Surgeon(s) and Role:    * [name], MD - Primary  ASSISTANT: none  ANESTHESIA:   regional and MAC  EBL: minimal  BLOOD ADMINISTERED:none  DRAINS: none   LOCAL MEDICATIONS USED:  NONE  SPECIMEN:  right great toe and metatarsal for culture / osteomyelitis evaluation by pathology  COUNTS: confirmed correct.  TOURNIQUET:  none  PATIENT DISPOSITION:  PACU - hemodynamically stable.   Delay start of Pharmacological VTE agent (>24hrs) due to surgical blood loss or risk of bleeding: no  INDICATION FOR PROCEDURE: Harold [name] is a 88 y.o. 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 After careful discussion of risks, benefits, and alternatives the patient was offered left first toe ray amputation. The patient understood and wished to proceed.  OPERATIVE FINDINGS: ulcer eroding into bone in lateral foot; unremarkable left ray amputation.   DESCRIPTION OF PROCEDURE: After identification of the patient in the pre-operative holding area, the patient was transferred to the operating room. The patient was positioned supine on the operating room table. Anesthesia was induced. The left foot and ankle was prepped and draped in standard fashion. A surgical pause was performed confirming correct patient, procedure, and operative location.  A ellipse incision was planned around the lateral foot ulcer overlying the distal left metatarsal head.  The incision was carried down through the skin around the first toe sharply with a 10 blade.  Incision was carried down through subcutaneous tissue until the bone was encountered using Bovie electrocautery.  Good bleeding tissue was noted in all aspects of the wound.   The periosteal tissue was elevated using a periosteal elevator around the first metatarsal.  Healthy bone was noted more proximally in the metatarsal, and the bone transected with an angled bone cutter here.  The entire specimen was removed as a unit and passed off the table for pathologic evaluation.  A rongeur was used to smooth the cut edge of the bone.  A rasp was used to smooth it further.  Wound was copiously irrigated.  The wound was closed in layers using 2-0 Vicryl and 2-0 nylon.  A bulky dressing was applied to the wound.  Upon completion of the case instrument and sharps counts were confirmed correct. The patient was transferred to the PACU in good condition. I was present for all portions of the procedure.  Harold [name]. Stanford Breed, MD [facility] Phone Number: [phone] 12/26/2021 3:40 PM

## 2021-12-26 NOTE — Transfer of Care (Signed)
Immediate Anesthesia Transfer of Care Note  Patient: Harold [name]  Procedure(s) Performed: LEFT FIRST TOE RAY AMPUTATION (Left: Toe)  Patient Location: PACU  Anesthesia Type:MAC combined with regional for post-op pain  Level of Consciousness: awake, alert  and patient cooperative  Airway & Oxygen Therapy: Patient Spontanous Breathing  Post-op Assessment: Report given to RN and Post -op Vital signs reviewed and stable  Post vital signs: Reviewed and stable  Last Vitals:  Vitals Value Taken Time  BP    Temp    Pulse    Resp    SpO2      Last Pain:  Vitals:   12/26/21 1322  PainSc: 0-No pain         Complications: No notable events documented.

## 2021-12-26 NOTE — Interval H&P Note (Signed)
History and Physical Interval Note:  12/26/2021 2:35 PM  Harold [name]  has presented today for surgery, with the diagnosis of PERIPHERAL ARTERY DISEASE; OSTEOMYELITIS.  The various methods of treatment have been discussed with the patient and family. After consideration of risks, benefits and other options for treatment, the patient has consented to  Procedure(s): LEFT FIRST TOE AMPUTATION RAY (Left) as a surgical intervention.  The patient's history has been reviewed, patient examined, no change in status, stable for surgery.  I have reviewed the patient's chart and labs.  Questions were answered to the patient's satisfaction.     [name]

## 2021-12-27 ENCOUNTER — Other Ambulatory Visit (HOSPITAL_COMMUNITY): Payer: Self-pay

## 2021-12-27 DIAGNOSIS — I739 Peripheral vascular disease, unspecified: Secondary | ICD-10-CM | POA: Diagnosis not present

## 2021-12-27 DIAGNOSIS — Z87891 Personal history of nicotine dependence: Secondary | ICD-10-CM | POA: Diagnosis not present

## 2021-12-27 DIAGNOSIS — S98139A Complete traumatic amputation of one unspecified lesser toe, initial encounter: Secondary | ICD-10-CM | POA: Diagnosis not present

## 2021-12-27 DIAGNOSIS — Z955 Presence of coronary angioplasty implant and graft: Secondary | ICD-10-CM | POA: Diagnosis not present

## 2021-12-27 DIAGNOSIS — Z951 Presence of aortocoronary bypass graft: Secondary | ICD-10-CM | POA: Diagnosis not present

## 2021-12-27 DIAGNOSIS — E119 Type 2 diabetes mellitus without complications: Secondary | ICD-10-CM | POA: Diagnosis not present

## 2021-12-27 DIAGNOSIS — R2689 Other abnormalities of gait and mobility: Secondary | ICD-10-CM | POA: Diagnosis not present

## 2021-12-27 DIAGNOSIS — M86172 Other acute osteomyelitis, left ankle and foot: Secondary | ICD-10-CM | POA: Diagnosis not present

## 2021-12-27 DIAGNOSIS — I10 Essential (primary) hypertension: Secondary | ICD-10-CM | POA: Diagnosis not present

## 2021-12-27 DIAGNOSIS — M6281 Muscle weakness (generalized): Secondary | ICD-10-CM | POA: Diagnosis not present

## 2021-12-27 DIAGNOSIS — Z7984 Long term (current) use of oral hypoglycemic drugs: Secondary | ICD-10-CM | POA: Diagnosis not present

## 2021-12-27 DIAGNOSIS — Z79899 Other long term (current) drug therapy: Secondary | ICD-10-CM | POA: Diagnosis not present

## 2021-12-27 DIAGNOSIS — I2510 Atherosclerotic heart disease of native coronary artery without angina pectoris: Secondary | ICD-10-CM | POA: Diagnosis not present

## 2021-12-27 DIAGNOSIS — L03032 Cellulitis of left toe: Secondary | ICD-10-CM | POA: Diagnosis not present

## 2021-12-27 DIAGNOSIS — Z7982 Long term (current) use of aspirin: Secondary | ICD-10-CM | POA: Diagnosis not present

## 2021-12-27 LAB — BASIC METABOLIC PANEL
Anion gap: 7 (ref 5–15)
BUN: 13 mg/dL (ref 8–23)
CO2: 25 mmol/L (ref 22–32)
Calcium: 8.2 mg/dL — ABNORMAL LOW (ref 8.9–10.3)
Chloride: 109 mmol/L (ref 98–111)
Creatinine, Ser: 1.01 mg/dL (ref 0.61–1.24)
GFR, Estimated: >60 mL/min (ref >60)
Glucose, Bld: 185 mg/dL — ABNORMAL HIGH (ref 70–99)
Potassium: 3.2 mmol/L — ABNORMAL LOW (ref 3.5–5.1)
Sodium: 141 mmol/L (ref 135–145)

## 2021-12-27 LAB — POCT I-STAT, CHEM 8
BUN: 12 mg/dL (ref 8–23)
Calcium, Ion: 1.16 mmol/L (ref 1.15–1.40)
Chloride: 104 mmol/L (ref 98–111)
Creatinine, Ser: 0.8 mg/dL (ref 0.61–1.24)
Glucose, Bld: 125 mg/dL — ABNORMAL HIGH (ref 70–99)
HCT: 36 % — ABNORMAL LOW (ref 39.0–52.0)
Hemoglobin: 12.2 g/dL — ABNORMAL LOW (ref 13.0–17.0)
Potassium: 3 mmol/L — ABNORMAL LOW (ref 3.5–5.1)
Sodium: 143 mmol/L (ref 135–145)
TCO2: 26 mmol/L (ref 22–32)

## 2021-12-27 LAB — CBC
HCT: 29.8 % — ABNORMAL LOW (ref 39.0–52.0)
Hemoglobin: 9.7 g/dL — ABNORMAL LOW (ref 13.0–17.0)
MCH: 29.8 pg (ref 26.0–34.0)
MCHC: 32.6 g/dL (ref 30.0–36.0)
MCV: 91.7 fL (ref 80.0–100.0)
Platelets: 221 10*3/uL (ref 150–400)
RBC: 3.25 MIL/uL — ABNORMAL LOW (ref 4.22–5.81)
RDW: 14.2 % (ref 11.5–15.5)
WBC: 8.5 10*3/uL (ref 4.0–10.5)
nRBC: 0 % (ref 0.0–0.2)

## 2021-12-27 LAB — GLUCOSE, CAPILLARY: Glucose-Capillary: 135 mg/dL — ABNORMAL HIGH (ref 70–99)

## 2021-12-27 MED ORDER — HYDROCODONE-ACETAMINOPHEN 5-325 MG PO TABS
1.0000 | ORAL_TABLET | Freq: Four times a day (QID) | ORAL | 0 refills | Status: DC | PRN
Start: 2021-12-27 — End: 2022-03-25
  Filled 2021-12-27: qty 20, 5d supply, fill #0

## 2021-12-27 MED ORDER — SODIUM CHLORIDE 0.9 % IV SOLN: INTRAVENOUS | Status: DC | PRN

## 2021-12-27 NOTE — Discharge Summary (Signed)
Discharge Summary    Harold [name] [birth year] 88 y.o. male  [id number]  Admission Date: 12/26/2021  Discharge Date: 12/29/2021  Physician: No att. providers found  Admission Diagnosis: Amputated toe [facility]) [M08.676P]   HPI:   This is a 88 y.o. male who presents to clinic for evaluation of left foot ulcer.  This is been present for many weeks.  He has been under the care of Dr. Cannon [name], has been doing meticulous wound care.  An ankle-brachial index was ordered, and suggestive of peripheral arterial disease.  He was referred for further evaluation.  The patient is a spry, and energetic 88 year old looking much younger than his stated age.  He is reportedly quite active per his daughter who is in the room with him.  His daughter is a Marine scientist in the neonatal unit at [employer].   11/26/21: Patient returns for postoperative visit.  He underwent left common femoral to below-knee popliteal artery bypass on 10/24/2021.  He tolerated this well.  He returns to clinic for reevaluation.  Thankfully his left foot feels better.  The ulcer about his left foot appears to be healing.  Unfortunately recent x-ray showed some evidence of osteomyelitis about the first metatarsal head.  He is also developed some focal swelling about the popliteal incision.   12/24/21: Patient returns for follow-up.  He thinks his foot is doing better.  He continues to get IV antibiotics and local wound care at the wound care center.  He continues to refuse great toe ray amputation.  Pt's daughter Harold [name] called to let us know pt is at [facility] ED and would like to proceed with toe amputation. Pt was in yesterday to see MD and was not amenable to this at that time, but has since reconsidered and wishes to proceed. They do have questions they wish to discuss with MD.  Hospital Course:  The patient was admitted to the hospital and taken to the operating room on 12/26/2021 and underwent: Left great toe partial ray amputation     Findings: ulcer eroding into bone in lateral foot; unremarkable left ray amputation.   The pt tolerated the procedure well and was transported to the PACU in good condition.   On POD 1, his dressing was removed and his incision looked good.  He had a darco shoe ordered for heel weight bearing only.  He was discharged home later that day for plans to return in 3-4 weeks for suture removal.     CBC    Component Value Date/Time   WBC 8.5 12/27/2021 0258   RBC 3.25 (L) 12/27/2021 0258   HGB 9.7 (L) 12/27/2021 0258   HCT 29.8 (L) 12/27/2021 0258   PLT 221 12/27/2021 0258   MCV 91.7 12/27/2021 0258   MCH 29.8 12/27/2021 0258   MCHC 32.6 12/27/2021 0258   RDW 14.2 12/27/2021 0258   LYMPHSABS 1.2 12/25/2021 1116   MONOABS 0.6 12/25/2021 1116   EOSABS 0.0 12/25/2021 1116   BASOSABS 0.0 12/25/2021 1116    BMET    Component Value Date/Time   NA 141 12/27/2021 0258   K 3.2 (L) 12/27/2021 0258   CL 109 12/27/2021 0258   CO2 25 12/27/2021 0258   GLUCOSE 185 (H) 12/27/2021 0258   BUN 13 12/27/2021 0258   CREATININE 1.01 12/27/2021 0258   CREATININE 1.05 11/15/2021 0934   CALCIUM 8.2 (L) 12/27/2021 0258   GFRNONAA >60 12/27/2021 0258   GFRAA >90 03/03/2013 0447      Discharge Instructions  Call MD for:  redness, tenderness, or signs of infection (pain, swelling, bleeding, redness, odor or green/yellow discharge around incision site)   Complete by: As directed    Call MD for:  severe or increased pain, loss or decreased feeling  in affected limb(s)   Complete by: As directed    Call MD for:  temperature >100.5   Complete by: As directed    Discharge instructions   Complete by: As directed    Heel weight bearing with Darco shoe left foot   Discharge wound care:   Complete by: As directed    Leave dressing on for 24 hours then remove and clean with soap and water.  Pat dry and replace dressing with 4x4 gauze, kerlix and ace wrap daily.   Resume previous diet   Complete  by: As directed        Discharge Diagnosis:  Amputated toe ([place]) [D22.025K]  Secondary Diagnosis: Patient Active Problem List   Diagnosis Date Noted   Amputated toe ([place]) 12/26/2021   Hx of CABG 12/16/2021   Acute osteomyelitis involving ankle and foot, left ([place]) 11/15/2021   PAD (peripheral artery disease) ([place]) 10/24/2021   Preop cardiovascular exam 03/01/2013   Cholecystitis, acute 02/28/2013   Coronary artery disease involving native coronary artery of native heart without angina pectoris 02/28/2013   Diabetes ([place]) 02/28/2013   Dyslipidemia 02/28/2013   Essential hypertension 02/28/2013   Past Medical History:  Diagnosis Date   Arthritis    "generalized" (03/01/2013)   CAD (coronary artery disease)    Cataract    GERD (gastroesophageal reflux disease)    History of cardiac cath 03/01/2013   [facility] (hard of hearing)    Hyperlipidemia    Myocardial infarction ([place]) 06/16/2002   PONV (postoperative nausea and vomiting)    Type II diabetes mellitus ([place])    Unspecified essential hypertension      Allergies as of 12/27/2021   No Known Allergies      Medication List     TAKE these medications    Accu-Chek Aviva Plus test strip Generic drug: glucose blood   Accu-Chek Softclix Lancets lancets SMARTSIG:Topical   amLODipine 5 MG tablet Commonly known as: NORVASC Take 5 mg by mouth daily.   aspirin EC 81 MG tablet Take 81 mg by mouth daily.   dorzolamide-timolol 22.3-6.8 MG/ML ophthalmic solution Commonly known as: COSOPT 1 drop 2 (two) times daily.   gabapentin 100 MG capsule Commonly known as: NEURONTIN Take 100 mg by mouth 3 (three) times daily.   HYDROcodone-acetaminophen 5-325 MG tablet Commonly known as: Norco Take 1 tablet by mouth every 6 (six) hours as needed for moderate pain.   latanoprost 0.005 % ophthalmic solution Commonly known as: XALATAN Place 1 drop into both eyes at bedtime.   lidocaine 2 % jelly Commonly known as:  XYLOCAINE Apply 1 application. topically 3 (three) times daily as needed.   lidocaine 5 % ointment Commonly known as: XYLOCAINE 1 application. daily as needed for mild pain or moderate pain.   lisinopril-hydrochlorothiazide 20-25 MG tablet Commonly known as: ZESTORETIC Take 1 tablet by mouth daily.   metFORMIN 500 MG tablet Commonly known as: GLUCOPHAGE Take 500 mg by mouth 2 (two) times daily with a meal.   pantoprazole 40 MG tablet Commonly known as: PROTONIX Take 40 mg by mouth daily.   Santyl 250 UNIT/GM ointment Generic drug: collagenase Apply topically daily.   simvastatin 80 MG tablet Commonly known as: ZOCOR Take 40 mg by mouth at bedtime.  Vitamin D3 50 MCG (2000 UT) Tabs Take 2,000 Units by mouth daily.               Discharge Care Instructions  (From admission, onward)           Start     Ordered   12/27/21 0000  Discharge wound care:       Comments: Leave dressing on for 24 hours then remove and clean with soap and water.  Pat dry and replace dressing with 4x4 gauze, kerlix and ace wrap daily.   12/27/21 1007            Prescriptions given: Norco #20 No Refill  Instructions: Leave dressing for 24 hours then remove and clean with soap and water and redress with 4x4 gauze, kerlix and ace wrap daily Left heel weight bearing only with darco shoe  Disposition: home  Patient's condition: is Good  Follow up: 1. VVS in 3-4 weeks   Leontine Locket, PA-C [facility] [phone] 12/29/2021  8:08 AM

## 2021-12-27 NOTE — Progress Notes (Addendum)
  Progress Note    12/27/2021 6:46 AM 1 Day Post-Op  Subjective:  ready to go home  Afebrile HR 50's-60's NSR 15'A-309'M systolic 076% RA  Vitals:   12/26/21 2305 12/27/21 0442  BP: (!) 124/56 (!) 123/54  Pulse: (!) 51 60  Resp: 17 18  Temp: (!) 97.5 F (36.4 C) 97.7 F (36.5 C)  SpO2: 99% 99%    Physical Exam: General:  no distress Lungs:  non labored Incisions:      CBC    Component Value Date/Time   WBC 8.5 12/27/2021 0258   RBC 3.25 (L) 12/27/2021 0258   HGB 9.7 (L) 12/27/2021 0258   HCT 29.8 (L) 12/27/2021 0258   PLT 221 12/27/2021 0258   MCV 91.7 12/27/2021 0258   MCH 29.8 12/27/2021 0258   MCHC 32.6 12/27/2021 0258   RDW 14.2 12/27/2021 0258   LYMPHSABS 1.2 12/25/2021 1116   MONOABS 0.6 12/25/2021 1116   EOSABS 0.0 12/25/2021 1116   BASOSABS 0.0 12/25/2021 1116    BMET    Component Value Date/Time   NA 141 12/27/2021 0258   K 3.2 (L) 12/27/2021 0258   CL 109 12/27/2021 0258   CO2 25 12/27/2021 0258   GLUCOSE 185 (H) 12/27/2021 0258   BUN 13 12/27/2021 0258   CREATININE 1.01 12/27/2021 0258   CREATININE 1.05 11/15/2021 0934   CALCIUM 8.2 (L) 12/27/2021 0258   GFRNONAA >60 12/27/2021 0258   GFRAA >90 03/03/2013 0447    INR    Component Value Date/Time   INR 1.5 (H) 10/24/2021 0844     Intake/Output Summary (Last 24 hours) at 12/27/2021 0646 Last data filed at 12/26/2021 1525 Gross per 24 hour  Intake 600 ml  Output 20 ml  Net 580 ml   Component 1 d ago  Specimen Description TISSUE LEFT TOE   Special Requests LEFT 1ST TOE FOR OSTEOMYELITIS   Gram Stain NO WBC SEEN  NO ORGANISMS SEEN  Performed at [facility], [address], [place], [place] [address]   Culture PENDING   Report Status PENDING     Assessment/Plan:  88 y.o. male is s/p:  S/p left great toe partial ray amputation 1 Day Post-Op   -dressing removed-bloody drainage present-incision looks good.  No evidence of active bleeding.   -cultures so far no  organisms present -will discharge home today.  He lives with his daughter in [place]. -f/u in 4 weeks for suture removal. -will need to ambulate with darco shoe for heel weight bearing only.  Discussed this with pt.      Leontine Locket, PA-C [facility] [phone] 12/27/2021 6:46 AM  VASCULAR STAFF ADDENDUM: I have independently interviewed and examined the patient. I agree with the above.  Reviewed wound care with daughter. PT working on heel WBAT.  Ready for DC.  Yevonne Aline. Stanford Breed, MD [facility] Phone Number: [phone] 12/27/2021 10:34 AM

## 2021-12-27 NOTE — Evaluation (Signed)
Physical Therapy Evaluation Patient Details Name: Harold [name] MRN: [id number] DOB: January 30, 1932 Today's Date: 12/27/2021  History of Present Illness  Pt is an 88 y/o male s/p L great toe partial amputation secondary to L foot wound. PMH includes CAD, DM, MI, and HTN.  Clinical Impression  Pt admitted secondary to problem above with deficits below. Pt requiring min A for bed mobility and transfers and min guard A for gait using RW. Educated about heel weightbearing and daughter present and gave cues appropriately. Educated about using RW at home to increase safety. Recommending HHPT at d/c to address deficits. Will continue to follow acutely.        Recommendations for follow up therapy are one component of a multi-disciplinary discharge planning process, led by the attending physician.  Recommendations may be updated based on patient status, additional functional criteria and insurance authorization.  Follow Up Recommendations Home health PT      Assistance Recommended at Discharge Frequent or constant Supervision/Assistance (initially)  Patient can return home with the following  A little help with bathing/dressing/bathroom;Assistance with cooking/housework;Assist for transportation;Help with stairs or ramp for entrance    Equipment Recommendations Rolling walker (2 wheels)  Recommendations for Other Services       Functional Status Assessment Patient has had a recent decline in their functional status and demonstrates the ability to make significant improvements in function in a reasonable and predictable amount of time.     Precautions / Restrictions Precautions Precautions: Fall Required Braces or Orthoses: Other Brace Other Brace: darco shoe Restrictions Weight Bearing Restrictions: Yes LLE Weight Bearing:  (heel weightbearing in darco shoe)      Mobility  Bed Mobility Overal bed mobility: Needs Assistance Bed Mobility: Supine to Sit, Sit to Supine     Supine to  sit: Min assist Sit to supine: Supervision   General bed mobility comments: Min A for trunk elevation and to scoot hips to EOB.    Transfers Overall transfer level: Needs assistance Equipment used: Rolling walker (2 wheels) Transfers: Sit to/from Stand Sit to Stand: Min assist           General transfer comment: Min A for lift assist and steadying to stand from lower surface. Pt has higher bed at home and lift chair to assist with standing.    Ambulation/Gait Ambulation/Gait assistance: Min guard Gait Distance (Feet): 175 Feet Assistive device: Rolling walker (2 wheels) Gait Pattern/deviations: Step-to pattern, Decreased step length - left, Decreased step length - right, Decreased weight shift to left Gait velocity: Decreased     General Gait Details: cues for heel weightbearing. Min guard for safety.  Stairs            Wheelchair Mobility    Modified Rankin (Stroke Patients Only)       Balance Overall balance assessment: Needs assistance Sitting-balance support: No upper extremity supported, Feet supported Sitting balance-Leahy Scale: Fair     Standing balance support: Bilateral upper extremity supported Standing balance-Leahy Scale: Poor Standing balance comment: Reliant on BUE support                             Pertinent Vitals/Pain Pain Assessment Pain Assessment: Faces Faces Pain Scale: Hurts little more Pain Location: L foot Pain Descriptors / Indicators: Grimacing, Guarding Pain Intervention(s): Monitored during session, Limited activity within patient's tolerance, Repositioned    Home Living Family/patient expects to be discharged to:: Private residence Living Arrangements: Children Available Help at  Discharge: Family Type of Home: House Home Access: Stairs to enter   [employer] of Steps: 1 (threshold) Alternate Level Stairs-Number of Steps: chair lift Home Layout: Two level Home Equipment: Cane - single  point;Rollator (4 wheels);Grab bars - toilet;Grab bars - tub/shower;Shower seat      Prior Function Prior Level of Function : Independent/Modified Independent             Mobility Comments: Uses cane vs rollator for ambulation       Hand Dominance        Extremity/Trunk Assessment   Upper Extremity Assessment Upper Extremity Assessment: Overall WFL for tasks assessed    Lower Extremity Assessment Lower Extremity Assessment: LLE deficits/detail LLE Deficits / Details: deficits consistent with post op pain and weakness.    Cervical / Trunk Assessment Cervical / Trunk Assessment: Normal  Communication   Communication: No difficulties  Cognition Arousal/Alertness: Awake/alert Behavior During Therapy: WFL for tasks assessed/performed Overall Cognitive Status: Within Functional Limits for tasks assessed                                 General Comments: At his baseline. Very direct when speaking to therapist and MD.        General Comments General comments (skin integrity, edema, etc.): Daughter present throughout    Exercises     Assessment/Plan    PT Assessment Patient needs continued PT services  PT Problem List Decreased strength;Decreased balance;Decreased activity tolerance;Decreased mobility;Decreased knowledge of use of DME;Decreased knowledge of precautions;Decreased safety awareness;Decreased cognition       PT Treatment Interventions DME instruction;Gait training;Stair training;Functional mobility training;Balance training;Therapeutic exercise;Therapeutic activities;Patient/family education    PT Goals (Current goals can be found in the Care Plan section)  Acute Rehab PT Goals Patient Stated Goal: to go home ASAP PT Goal Formulation: With patient Time For Goal Achievement: 01/10/22 Potential to Achieve Goals: Good    Frequency Min 3X/week     Co-evaluation               AM-PAC PT "6 Clicks" Mobility  Outcome Measure Help  needed turning from your back to your side while in a flat bed without using bedrails?: A Little Help needed moving from lying on your back to sitting on the side of a flat bed without using bedrails?: A Little Help needed moving to and from a bed to a chair (including a wheelchair)?: A Little Help needed standing up from a chair using your arms (e.g., wheelchair or bedside chair)?: A Little Help needed to walk in hospital room?: A Little Help needed climbing 3-5 steps with a railing? : A Lot 6 Click Score: 17    End of Session Equipment Utilized During Treatment: Gait belt;Other (comment) (darco shoe) Activity Tolerance: Patient tolerated treatment well Patient left: in bed;with call bell/phone within reach;with family/visitor present Nurse Communication: Mobility status PT Visit Diagnosis: Other abnormalities of gait and mobility (R26.89);Muscle weakness (generalized) (M62.81);Difficulty in walking, not elsewhere classified (R26.2)    Time: 1016-1040 PT Time Calculation (min) (ACUTE ONLY): 24 min   Charges:   PT Evaluation $PT Eval Low Complexity: 1 Low PT Treatments $Gait Training: 8-22 mins        Lou Miner, DPT  Acute Rehabilitation Services  Office: [phone]   Rudean Hitt 12/27/2021, 10:48 AM

## 2021-12-27 NOTE — TOC Transition Note (Signed)
Transition of Care (TOC) - CM/SW Discharge Note Marvetta Gibbons RN, [name] Transitions of Care Unit 4E- RN Case Manager See Treatment Team for direct phone #    Patient Details  Name: Harold [name] MRN: [id number] Date of Birth: 1932/04/13  Transition of Care [facility] CM/SW Contact:  Dawayne Patricia, RN Phone Number: 12/27/2021, 12:17 PM   Clinical Narrative:    Pt stable for transition home today, per PT eval recommendations for HHPT and RW. Orders have been placed.  CM in to speak with pt regarding transition needs. Per pt he does not have a RW and reports he already has a [place] agency coming out to the home but does not remember name.  Daughter arrived to the room during conversation- confirmed pt has rollator at home but not a RW- agreeable to in house provider for RW. Daughter also provided name for [facility] agency- pt is active with [place]- and daughter reports they are aware of pt being in for procedure and need for new orders.   CM called liaison for [facility]- spoke w/ Lattie Haw- confirmed pt is active with them and they will resume HH under new order for HHPT.   Daughter to transport home once DME- RW arrives to room.    Final next level of care: [place] Barriers to Discharge: No Barriers Identified   Patient Goals and CMS Choice Patient states their goals for this hospitalization and ongoing recovery are:: return home CMS Medicare.gov Compare Post Acute Care list provided to:: Patient Choice offered to / list presented to : Patient, Adult Children (daughter)  Discharge Placement                 Home w/ [facility]      Discharge Plan and Services   Discharge Planning Services: CM Consult Post Acute Care Choice: Durable Medical Equipment, Home Health, Resumption of Svcs/PTA Provider          DME Arranged: Walker rolling DME Agency: [employer] Date DME Agency Contacted: 12/27/21 Time DME Agency Contacted: 20 Representative spoke with at DME Agency:  [place]: PT [place]: [place] Date [place]: 12/27/21 Time [place]: 1217 Representative spoke with at [place]: [place] ([place]) Interventions     Readmission Risk Interventions    12/27/2021   12:17 PM  Readmission Risk Prevention Plan  Post Dischage Appt Complete  Medication Screening Complete  Transportation Screening Complete

## 2021-12-27 NOTE — Anesthesia Postprocedure Evaluation (Addendum)
Anesthesia Post Note  Patient: Harold [name]  Procedure(s) Performed: LEFT FIRST TOE RAY AMPUTATION (Left: Toe)     Patient location during evaluation: PACU Anesthesia Type: MAC Level of consciousness: awake and alert Pain management: pain level controlled Vital Signs Assessment: post-procedure vital signs reviewed and stable Respiratory status: spontaneous breathing Cardiovascular status: stable Anesthetic complications: no   No notable events documented.  Last Vitals:  Vitals:   12/27/21 0442 12/27/21 0820  BP: (!) 123/54 (!) 141/53  Pulse: 60 64  Resp: 18 15  Temp: 36.5 C 36.5 C  SpO2: 99% 100%    Last Pain:  Vitals:   12/27/21 0941  TempSrc:   PainSc: [place]

## 2021-12-27 NOTE — Progress Notes (Signed)
Orthopedic Tech Progress Note Patient Details:  KERRI ASCHE January 15, 1932 [id number]  Ortho Devices Type of Ortho Device: Darco shoe Ortho Device/Splint Location: lle Ortho Device/Splint Interventions: Ordered, Application, Adjustment   Post Interventions Patient Tolerated: Well Instructions Provided: Care of device, Adjustment of device  Karolee Stamps 12/27/2021, 6:33 AM

## 2021-12-28 ENCOUNTER — Encounter (HOSPITAL_COMMUNITY): Payer: Self-pay | Admitting: Vascular Surgery

## 2021-12-30 ENCOUNTER — Telehealth: Payer: Self-pay

## 2021-12-30 DIAGNOSIS — E119 Type 2 diabetes mellitus without complications: Secondary | ICD-10-CM | POA: Diagnosis not present

## 2021-12-30 DIAGNOSIS — I10 Essential (primary) hypertension: Secondary | ICD-10-CM | POA: Diagnosis not present

## 2021-12-30 DIAGNOSIS — I2510 Atherosclerotic heart disease of native coronary artery without angina pectoris: Secondary | ICD-10-CM | POA: Diagnosis not present

## 2021-12-30 DIAGNOSIS — I739 Peripheral vascular disease, unspecified: Secondary | ICD-10-CM | POA: Diagnosis not present

## 2021-12-30 DIAGNOSIS — Z89412 Acquired absence of left great toe: Secondary | ICD-10-CM | POA: Diagnosis not present

## 2021-12-30 DIAGNOSIS — K219 Gastro-esophageal reflux disease without esophagitis: Secondary | ICD-10-CM | POA: Diagnosis not present

## 2021-12-30 DIAGNOSIS — Z48812 Encounter for surgical aftercare following surgery on the circulatory system: Secondary | ICD-10-CM | POA: Diagnosis not present

## 2021-12-30 DIAGNOSIS — Z4781 Encounter for orthopedic aftercare following surgical amputation: Secondary | ICD-10-CM | POA: Diagnosis not present

## 2021-12-30 DIAGNOSIS — Z7984 Long term (current) use of oral hypoglycemic drugs: Secondary | ICD-10-CM | POA: Diagnosis not present

## 2021-12-30 DIAGNOSIS — M1380 Other specified arthritis, unspecified site: Secondary | ICD-10-CM | POA: Diagnosis not present

## 2021-12-30 LAB — CULTURE, BLOOD (ROUTINE X 2)
Culture: NO GROWTH
Culture: NO GROWTH

## 2021-12-30 LAB — SURGICAL PATHOLOGY

## 2021-12-30 NOTE — Telephone Encounter (Signed)
Harold [name], PT with [facility], called stating that the pt has not been wearing the darco shoe because he states it will make him fall, he has not been walking on his heel only, and he has been walking flat in his slippers. She was seeking advice on how to proceed.  Reviewed pt's chart, spoke with [facility] PA, returned call, no answer, lf vm stating that as long as the pt's incision remained intact and the pt had no injuries, to have him ensure safety even if he was not following orders exactly.

## 2021-12-31 ENCOUNTER — Encounter (HOSPITAL_BASED_OUTPATIENT_CLINIC_OR_DEPARTMENT_OTHER): Payer: Medicare Other | Admitting: Internal Medicine

## 2021-12-31 ENCOUNTER — Encounter: Payer: Self-pay | Admitting: Vascular Surgery

## 2021-12-31 LAB — AEROBIC/ANAEROBIC CULTURE W GRAM STAIN (SURGICAL/DEEP WOUND): Gram Stain: NONE SEEN

## 2022-01-02 ENCOUNTER — Ambulatory Visit: Payer: Medicare Other | Admitting: Internal Medicine

## 2022-01-03 DIAGNOSIS — J449 Chronic obstructive pulmonary disease, unspecified: Secondary | ICD-10-CM | POA: Diagnosis not present

## 2022-01-03 DIAGNOSIS — I10 Essential (primary) hypertension: Secondary | ICD-10-CM | POA: Diagnosis not present

## 2022-01-03 DIAGNOSIS — K219 Gastro-esophageal reflux disease without esophagitis: Secondary | ICD-10-CM | POA: Diagnosis not present

## 2022-01-03 DIAGNOSIS — E1165 Type 2 diabetes mellitus with hyperglycemia: Secondary | ICD-10-CM | POA: Diagnosis not present

## 2022-01-03 DIAGNOSIS — E782 Mixed hyperlipidemia: Secondary | ICD-10-CM | POA: Diagnosis not present

## 2022-01-06 ENCOUNTER — Other Ambulatory Visit: Payer: Self-pay | Admitting: Internal Medicine

## 2022-01-06 DIAGNOSIS — R634 Abnormal weight loss: Secondary | ICD-10-CM | POA: Diagnosis not present

## 2022-01-06 DIAGNOSIS — R1310 Dysphagia, unspecified: Secondary | ICD-10-CM

## 2022-01-07 ENCOUNTER — Ambulatory Visit (HOSPITAL_BASED_OUTPATIENT_CLINIC_OR_DEPARTMENT_OTHER): Payer: Medicare Other | Admitting: Internal Medicine

## 2022-01-10 ENCOUNTER — Ambulatory Visit
Admission: RE | Admit: 2022-01-10 | Discharge: 2022-01-10 | Disposition: A | Discharge status: Home / Self Care | Payer: Medicare Other | Source: Ambulatory Visit | Attending: Internal Medicine | Admitting: Internal Medicine

## 2022-01-10 DIAGNOSIS — R1310 Dysphagia, unspecified: Secondary | ICD-10-CM

## 2022-01-10 DIAGNOSIS — K224 Dyskinesia of esophagus: Secondary | ICD-10-CM | POA: Diagnosis not present

## 2022-01-13 NOTE — Progress Notes (Unsigned)
VASCULAR AND VEIN SPECIALISTS OF [place]  ASSESSMENT / PLAN: Harold [name] is a 88 y.o. male with atherosclerosis of native arteries of left lower extremity status post left common femoral to below knee popliteal bypass with PTFE 10/24/21.  Recommend the following which can slow the progression of atherosclerosis and reduce the risk of major adverse cardiac / limb events:  Complete cessation from all tobacco products. Blood glucose control with goal A1c < 7%. Blood pressure control with goal blood pressure < 140/90 mmHg. Lipid reduction therapy with goal LDL-C <100 mg/dL (<70 if symptomatic from PAD).  Aspirin '81mg'$  PO QD.  Atorvastatin 40-'80mg'$  PO QD (or other "high intensity" statin therapy).  He has a fluid collection below the popliteal incision which seems most consistent with a seroma.  He has some mild post bypass swelling.  He has evidence of osteomyelitis in the left first distal metatarsal.  I counseled him that her amputation of the first toe would be the most definitive therapy for osteomyelitis.  He continues to refuse this approach. In the meantime I agree with continued antibiotic therapy and wound care.  We will see him again in 3 months.  CHIEF COMPLAINT: left foot ulceration  HISTORY OF PRESENT ILLNESS: Harold [name] is a 88 y.o. male who presents to clinic for evaluation of left foot ulcer.  This is been present for many weeks.  He has been under the care of Dr. Cannon Kettle, has been doing meticulous wound care.  An ankle-brachial index was ordered, and suggestive of peripheral arterial disease.  He was referred for further evaluation.  The patient is a spry, and energetic 88 year old looking much younger than his stated age.  He is reportedly quite active per his daughter who is in the room with him.  His daughter is a Marine scientist in the neonatal unit at [employer].  11/26/21: Patient returns for postoperative visit.  He underwent left common femoral to below-knee popliteal  artery bypass on 10/24/2021.  He tolerated this well.  He returns to clinic for reevaluation.  Thankfully his left foot feels better.  The ulcer about his left foot appears to be healing.  Unfortunately recent x-ray showed some evidence of osteomyelitis about the first metatarsal head.  He is also developed some focal swelling about the popliteal incision.  12/24/21: Patient returns for follow-up.  He thinks his foot is doing better.  He continues to get IV antibiotics and local wound care at the wound care center.  He continues to refuse great toe ray amputation.  Past Medical History:  Diagnosis Date   Arthritis    "generalized" (03/01/2013)   CAD (coronary artery disease)    Cataract    GERD (gastroesophageal reflux disease)    History of cardiac cath 03/01/2013   HOH (hard of hearing)    Hyperlipidemia    Myocardial infarction ([place]) 06/16/2002   PONV (postoperative nausea and vomiting)    Type II diabetes mellitus ([place])    Unspecified essential hypertension     Past Surgical History:  Procedure Laterality Date   ABDOMINAL AORTOGRAM W/LOWER EXTREMITY N/A 10/11/2021   Procedure: ABDOMINAL AORTOGRAM W/LOWER EXTREMITY;  Surgeon: [name], MD;  Location: [facility];  Service: Cardiovascular;  Laterality: N/A;  Left Lower Extremity   AMPUTATION Left 12/26/2021   Procedure: LEFT FIRST TOE RAY AMPUTATION;  Surgeon: [name], MD;  Location: [facility] OR;  Service: Vascular;  Laterality: Left;   BACK SURGERY  1970s   CATARACT EXTRACTION W/ INTRAOCULAR LENS  IMPLANT Left 06/17/2011   CHOLECYSTECTOMY N/A 03/02/2013   Procedure: LAPAROSCOPIC CHOLECYSTECTOMY;  Surgeon: Rolm Bookbinder, MD;  Location: [place];  Service: General;  Laterality: N/A;   CORONARY ANGIOPLASTY WITH STENT PLACEMENT     "had 2-3; they clogged; he later had OHS" (03/01/2013)   CORONARY ARTERY BYPASS GRAFT  02/15/2003   "triple" (03/01/2013)   FEMORAL-POPLITEAL BYPASS GRAFT Left 10/24/2021   Procedure: BYPASS  LEFT COMMON FEMORAL-BELOW KNEE POPLITEAL ARTERY WITH 6MM BY 80CM RING GRAFT;  Surgeon: [name], MD;  Location: [facility];  Service: Vascular;  Laterality: Left;   TRANSTHORACIC ECHOCARDIOGRAM  12/18/2021   Poor windows, but relatively normal study.  Normal LV size and function with EF 55 to 60%.  No RWMA.  Mild LVH.  GR 1 DD.  Normal RV size and function.  Normal RAP (unable to assess RVP.)  Normal MV.  AOV sclerosis with no stenosis.    Family History  Problem Relation Age of Onset   Heart disease Mother    Diabetes Mother     Social History   Socioeconomic History   Marital status: Single    Spouse name: Not on file   Number of children: Not on file   Years of education: Not on file   Highest education level: Not on file  Occupational History   Not on file  Tobacco Use   Smoking status: Former    Years: 30.00    Types: Cigarettes   Smokeless tobacco: Never  Vaping Use   Vaping Use: Never used  Substance and Sexual Activity   Alcohol use: Not Currently    Comment: 03/01/2013 "drank years ago; never had problem w/it"   Drug use: No   Sexual activity: Never  Other Topics Concern   Not on file  Social History Narrative   Not on file   Social Determinants of Health   Financial Resource Strain: Not on file  Food Insecurity: Not on file  Transportation Needs: Not on file  Physical Activity: Not on file  Stress: Not on file  Social Connections: Not on file  Intimate Partner Violence: Not on file    No Known Allergies  Current Outpatient Medications  Medication Sig Dispense Refill   ACCU-CHEK AVIVA PLUS test strip      Accu-Chek Softclix Lancets lancets SMARTSIG:Topical     amLODipine (NORVASC) 5 MG tablet Take 5 mg by mouth daily.     aspirin EC 81 MG tablet Take 81 mg by mouth daily.     Cholecalciferol (VITAMIN D3) 50 MCG (2000 UT) TABS Take 2,000 Units by mouth daily.     dorzolamide-timolol (COSOPT) 22.3-6.8 MG/ML ophthalmic solution 1 drop 2 (two) times  daily.     gabapentin (NEURONTIN) 100 MG capsule Take 100 mg by mouth 3 (three) times daily.     HYDROcodone-acetaminophen (NORCO) 5-325 MG tablet Take 1 tablet by mouth every 6 (six) hours as needed for moderate pain. 20 tablet 0   latanoprost (XALATAN) 0.005 % ophthalmic solution Place 1 drop into both eyes at bedtime.     lidocaine (XYLOCAINE) 2 % jelly Apply 1 application. topically 3 (three) times daily as needed.     lidocaine (XYLOCAINE) 5 % ointment 1 application. daily as needed for mild pain or moderate pain.     lisinopril-hydrochlorothiazide (ZESTORETIC) 20-25 MG tablet Take 1 tablet by mouth daily.     metFORMIN (GLUCOPHAGE) 500 MG tablet Take 500 mg by mouth 2 (two) times daily with a meal.  pantoprazole (PROTONIX) 40 MG tablet Take 40 mg by mouth daily.     SANTYL 250 UNIT/GM ointment Apply topically daily.     simvastatin (ZOCOR) 80 MG tablet Take 40 mg by mouth at bedtime.     No current facility-administered medications for this visit.    PHYSICAL EXAM There were no vitals filed for this visit.     Constitutional: well appearing elderly gentleman. no distress. Appears well nourished.  Cardiac: regular rate and rhythm.  Respiratory:  unlabored. Abdominal:  soft, non-tender, non-distended.  Peripheral vascular: Brisk Doppler flow in the foot.  Left foot ulcer appears slightly improved with a healing edge.  Ballotable fluid collection about the left popliteal incision which is not inflamed, indurated, tender, or draining.  PERTINENT LABORATORY AND RADIOLOGIC DATA  Most recent CBC    Latest Ref Rng & Units 12/27/2021    2:58 AM 12/26/2021    1:29 PM 12/25/2021   11:16 AM  CBC  WBC 4.0 - 10.5 K/uL 8.5   8.7   Hemoglobin 13.0 - 17.0 g/dL 9.7  12.2  11.9   Hematocrit 39.0 - 52.0 % 29.8  36.0  36.4   Platelets 150 - 400 K/uL 221   239      Most recent CMP    Latest Ref Rng & Units 12/27/2021    2:58 AM 12/26/2021    1:29 PM 12/25/2021   11:16 AM  CMP  Glucose  70 - 99 mg/dL 185  125  199   BUN 8 - 23 mg/dL '13  12  23   '$ Creatinine 0.61 - 1.24 mg/dL 1.01  0.80  0.97   Sodium 135 - 145 mmol/L 141  143  140   Potassium 3.5 - 5.1 mmol/L 3.2  3.0  3.3   Chloride 98 - 111 mmol/L 109  104  99   CO2 22 - 32 mmol/L 25   27   Calcium 8.9 - 10.3 mg/dL 8.2   9.7   Total Protein 6.5 - 8.1 g/dL   6.8   Total Bilirubin 0.3 - 1.2 mg/dL   0.6   Alkaline Phos 38 - 126 U/L   55   AST 15 - 41 U/L   16   ALT 0 - 44 U/L   6    Vascular Imaging:   +-------+-----------+-----------+------------+------------+  ABI/TBIToday's ABIToday's TBIPrevious ABIPrevious TBI  +-------+-----------+-----------+------------+------------+  Right  [place]         0.56       [place]          0.53          +-------+-----------+-----------+------------+------------+  Left   [place]         0.64       [place]          0.30          +-------+-----------+-----------+------------+------------+   Yevonne Aline. Stanford Breed, MD [facility] Specialists of [facility] Phone Number: [phone] 01/13/2022 9:44 PM  Portions of this report may have been transcribed using voice recognition software.  Every effort has been made to ensure accuracy; however, inadvertent computerized transcription errors may still be present.

## 2022-01-14 ENCOUNTER — Ambulatory Visit (INDEPENDENT_AMBULATORY_CARE_PROVIDER_SITE_OTHER): Payer: Medicare Other | Admitting: Vascular Surgery

## 2022-01-14 VITALS — BP 130/78 | HR 72 | Temp 97.8°F | Resp 20 | Ht 69.0 in | Wt 125.0 lb

## 2022-01-14 DIAGNOSIS — I739 Peripheral vascular disease, unspecified: Secondary | ICD-10-CM

## 2022-01-16 ENCOUNTER — Other Ambulatory Visit: Payer: Self-pay

## 2022-01-16 DIAGNOSIS — I739 Peripheral vascular disease, unspecified: Secondary | ICD-10-CM

## 2022-02-18 DIAGNOSIS — J449 Chronic obstructive pulmonary disease, unspecified: Secondary | ICD-10-CM | POA: Diagnosis not present

## 2022-02-18 DIAGNOSIS — E782 Mixed hyperlipidemia: Secondary | ICD-10-CM | POA: Diagnosis not present

## 2022-02-18 DIAGNOSIS — K219 Gastro-esophageal reflux disease without esophagitis: Secondary | ICD-10-CM | POA: Diagnosis not present

## 2022-02-18 DIAGNOSIS — E1165 Type 2 diabetes mellitus with hyperglycemia: Secondary | ICD-10-CM | POA: Diagnosis not present

## 2022-02-18 DIAGNOSIS — I10 Essential (primary) hypertension: Secondary | ICD-10-CM | POA: Diagnosis not present

## 2022-02-19 ENCOUNTER — Encounter: Payer: Medicare Other | Admitting: Speech Pathology

## 2022-03-04 ENCOUNTER — Other Ambulatory Visit (HOSPITAL_COMMUNITY): Payer: Self-pay | Admitting: Urology

## 2022-03-04 ENCOUNTER — Other Ambulatory Visit: Payer: Self-pay | Admitting: Urology

## 2022-03-04 DIAGNOSIS — C61 Malignant neoplasm of prostate: Secondary | ICD-10-CM

## 2022-03-10 ENCOUNTER — Ambulatory Visit (INDEPENDENT_AMBULATORY_CARE_PROVIDER_SITE_OTHER): Payer: Medicare Other | Admitting: Podiatry

## 2022-03-10 ENCOUNTER — Encounter: Payer: Self-pay | Admitting: Podiatry

## 2022-03-10 DIAGNOSIS — L97511 Non-pressure chronic ulcer of other part of right foot limited to breakdown of skin: Secondary | ICD-10-CM | POA: Diagnosis not present

## 2022-03-10 DIAGNOSIS — I739 Peripheral vascular disease, unspecified: Secondary | ICD-10-CM

## 2022-03-10 NOTE — Progress Notes (Signed)
Subjective:   Patient ID: Harold [name], male   DOB: 88 y.o.   MRN: [id number]   HPI Patient presents with a lesion on the second digit right foot that is painful and just started and he does have nails that he gets concerned about   ROS      Objective:  Physical Exam  Does have significant diminishment vascular flow and has a small petite breakdown of tissue on the medial side second digit right localized no other abnormal pathology no erythema edema or drainage associated with it with pressure of the big toe     Assessment:  Probability that this is a small petite tree ulcer and pressure between the 2 toes with vascular disease H&P     Plan:  Reviewed condition at great length discussed wider shoes and applied and debrided the lesion applied a small amount of padding around it with dressing and instructed on keeping it padded and dressed.  This is a kind of condition that are difficult to get better and he is moderately belligerent but I tried to explain to him and daughter that I am doing the best I can to try to help

## 2022-03-12 ENCOUNTER — Telehealth: Payer: Self-pay

## 2022-03-12 NOTE — Telephone Encounter (Signed)
I tried to explain to them that vascular flow is the biggest issue and keeping pressure off the area should help. He was very difficult to deal with

## 2022-03-17 ENCOUNTER — Telehealth: Payer: Self-pay | Admitting: Vascular Surgery

## 2022-03-17 NOTE — Telephone Encounter (Addendum)
Pt came in complaining of foot pain from a corn and wanted to see Dr. Stanford Breed. Explained he was not here and pt got very upset. He said he could hardly walk ,etc.  I offered to call [facility] and he said he had already been there and they didn't do anything. I called pt daughter who didn't know he had left the house. I called [facility] and he had been seen there earlier in the week and they explained to him that he needs to wrap it and keep medication on it and advised to get wider shoes. He does not want to go back to [facility]. He has an appt later in November and I explained we did not have anything earlier but we will call if there is a cancellation.

## 2022-03-21 ENCOUNTER — Encounter (HOSPITAL_COMMUNITY)
Admission: RE | Admit: 2022-03-21 | Discharge: 2022-03-21 | Disposition: A | Discharge status: Home / Self Care | Payer: Medicare Other | Source: Ambulatory Visit | Attending: Urology | Admitting: Urology

## 2022-03-21 DIAGNOSIS — C61 Malignant neoplasm of prostate: Secondary | ICD-10-CM | POA: Insufficient documentation

## 2022-03-21 DIAGNOSIS — N281 Cyst of kidney, acquired: Secondary | ICD-10-CM | POA: Diagnosis not present

## 2022-03-21 MED ORDER — TECHNETIUM TC 99M MEDRONATE IV KIT
20.0000 | PACK | Freq: Once | INTRAVENOUS | Status: AC | PRN
  Administered 2022-03-21: 19.6 via INTRAVENOUS

## 2022-03-24 ENCOUNTER — Telehealth: Payer: Self-pay | Admitting: Radiation Oncology

## 2022-03-24 NOTE — Telephone Encounter (Signed)
10/9 @ 1:29 pm Left voicemail for patient to call our office.  Waiting on call back to see if patient would like to reschedule his appt date/time for tomorrow due to cancellation -per Dr. Tammi Klippel.

## 2022-03-24 NOTE — Progress Notes (Signed)
GU Location of Tumor / Histology: Prostate Ca  If Prostate Cancer, Gleason Score is (4 + 3) and PSA is (19.36 on 12/2021)  Biopsies      Past/Anticipated interventions by urology, if any:     Past/Anticipated interventions by medical oncology, if any: NA  Weight changes, if any: Weight loss of 30 lbs.  IPSS:  7 SHIM:  refused  Bowel/Bladder complaints, if any:  No bowel and no bladder issues.  Nausea/Vomiting, if any: No  Pain issues, if any:  Right foot corn between great toe and second toe.  Left toe amputation.  SAFETY ISSUES: Prior radiation?  No Pacemaker/ICD? No Possible current pregnancy? Male Is the patient on methotrexate? No  Current Complaints / other details:

## 2022-03-25 ENCOUNTER — Ambulatory Visit
Admission: RE | Admit: 2022-03-25 | Discharge: 2022-03-25 | Disposition: A | Discharge status: Home / Self Care | Payer: Medicare Other | Source: Ambulatory Visit | Attending: Radiation Oncology | Admitting: Radiation Oncology

## 2022-03-25 ENCOUNTER — Other Ambulatory Visit: Payer: Self-pay

## 2022-03-25 VITALS — BP 149/73 | HR 69 | Temp 97.7°F | Resp 20 | Ht 70.0 in | Wt 138.4 lb

## 2022-03-25 DIAGNOSIS — I2510 Atherosclerotic heart disease of native coronary artery without angina pectoris: Secondary | ICD-10-CM | POA: Diagnosis not present

## 2022-03-25 DIAGNOSIS — E785 Hyperlipidemia, unspecified: Secondary | ICD-10-CM | POA: Diagnosis not present

## 2022-03-25 DIAGNOSIS — I10 Essential (primary) hypertension: Secondary | ICD-10-CM | POA: Insufficient documentation

## 2022-03-25 DIAGNOSIS — K219 Gastro-esophageal reflux disease without esophagitis: Secondary | ICD-10-CM | POA: Insufficient documentation

## 2022-03-25 DIAGNOSIS — Z87891 Personal history of nicotine dependence: Secondary | ICD-10-CM | POA: Insufficient documentation

## 2022-03-25 DIAGNOSIS — I252 Old myocardial infarction: Secondary | ICD-10-CM | POA: Insufficient documentation

## 2022-03-25 DIAGNOSIS — Z79899 Other long term (current) drug therapy: Secondary | ICD-10-CM | POA: Insufficient documentation

## 2022-03-25 DIAGNOSIS — C61 Malignant neoplasm of prostate: Secondary | ICD-10-CM

## 2022-03-25 DIAGNOSIS — Z191 Hormone sensitive malignancy status: Secondary | ICD-10-CM | POA: Diagnosis not present

## 2022-03-25 DIAGNOSIS — E119 Type 2 diabetes mellitus without complications: Secondary | ICD-10-CM | POA: Diagnosis not present

## 2022-03-25 NOTE — Progress Notes (Signed)
Radiation Oncology         [phone] ________________________________  Initial Outpatient Consultation  Name: Harold [name] MRN: [id number]  Date: 03/25/2022  DOB: 1932/02/17  BP:ZWCHE, Harold [place], MD  Harold Fries, MD   REFERRING PHYSICIAN: Robley Fries, MD  DIAGNOSIS: 88 y.o. gentleman with Stage T1c adenocarcinoma of the prostate with Gleason score of 4+3, and PSA of 19.36.    ICD-10-CM   1. Malignant neoplasm of prostate ([place])  C61       HISTORY OF PRESENT ILLNESS: Harold [name] is a 88 y.o. male with a diagnosis of prostate cancer. He has been complaining of persistent, unintentional weight loss over the past several years and was noted to have an elevated PSA of 19.36 by his primary care physician, Dr. Inda [name].  Accordingly, he was referred for evaluation in urology by Dr. Claudia [name] on 01/15/2022,  digital rectal examination was performed at that time revealing no nodularity or concerning findings.  The patient proceeded to transrectal ultrasound with 12 biopsies of the prostate on 02/20/2022.  The prostate volume measured 33 cc.  Out of 12 core biopsies, 4 were positive.  The maximum Gleason score was 4+3, and this was seen in the right mid, right mid lateral and right apex.  Additionally, Gleason 3+4 was seen in the right base.  Disease staging was performed with CT A/P and bone scan on 03/21/2022 with both studies being negative for any evidence of metastatic disease.  The patient reviewed the biopsy results with his urologist and he has kindly been referred today for discussion of potential radiation treatment options.   PREVIOUS RADIATION THERAPY: No  PAST MEDICAL HISTORY:  Past Medical History:  Diagnosis Date   Arthritis    "generalized" (03/01/2013)   CAD (coronary artery disease)    Cataract    GERD (gastroesophageal reflux disease)    History of cardiac cath 03/01/2013   HOH (hard of hearing)    Hyperlipidemia    Myocardial infarction ([place]) 06/16/2002    PONV (postoperative nausea and vomiting)    Type II diabetes mellitus ([place])    Unspecified essential hypertension       PAST SURGICAL HISTORY: Past Surgical History:  Procedure Laterality Date   ABDOMINAL AORTOGRAM W/LOWER EXTREMITY N/A 10/11/2021   Procedure: ABDOMINAL AORTOGRAM W/LOWER EXTREMITY;  Surgeon: Harold Robins, MD;  Location: [facility];  Service: Cardiovascular;  Laterality: N/A;  Left Lower Extremity   AMPUTATION Left 12/26/2021   Procedure: LEFT FIRST TOE RAY AMPUTATION;  Surgeon: Harold Robins, MD;  Location: [place];  Service: Vascular;  Laterality: Left;   BACK SURGERY  1970s   CATARACT EXTRACTION W/ INTRAOCULAR LENS IMPLANT Left 06/17/2011   CHOLECYSTECTOMY N/A 03/02/2013   Procedure: LAPAROSCOPIC CHOLECYSTECTOMY;  Surgeon: Harold Bookbinder, MD;  Location: [place];  Service: General;  Laterality: N/A;   CORONARY ANGIOPLASTY WITH STENT PLACEMENT     "had 2-3; they clogged; he later had OHS" (03/01/2013)   CORONARY ARTERY BYPASS GRAFT  02/15/2003   "triple" (03/01/2013)   FEMORAL-POPLITEAL BYPASS GRAFT Left 10/24/2021   Procedure: BYPASS LEFT COMMON FEMORAL-BELOW KNEE POPLITEAL ARTERY WITH 6MM BY 80CM RING GRAFT;  Surgeon: Harold Robins, MD;  Location: [facility];  Service: Vascular;  Laterality: Left;   TRANSTHORACIC ECHOCARDIOGRAM  12/18/2021   Poor windows, but relatively normal study.  Normal LV size and function with EF 55 to 60%.  No RWMA.  Mild LVH.  GR 1 DD.  Normal RV size and function.  Normal RAP (unable to assess RVP.)  Normal MV.  AOV sclerosis with no stenosis.    FAMILY HISTORY:  Family History  Problem Relation Age of Onset   Heart disease Mother    Diabetes Mother     SOCIAL HISTORY:  Social History   Socioeconomic History   Marital status: Single    Spouse name: Not on file   Number of children: Not on file   Years of education: Not on file   Highest education level: Not on file  Occupational History   Not on file  Tobacco Use    Smoking status: Former    Years: 30.00    Types: Cigarettes   Smokeless tobacco: Never  Vaping Use   Vaping Use: Never used  Substance and Sexual Activity   Alcohol use: Not Currently    Comment: 03/01/2013 "drank years ago; never had problem w/it"   Drug use: No   Sexual activity: Never  Other Topics Concern   Not on file  Social History Narrative   Not on file   Social Determinants of Health   Financial Resource Strain: Not on file  Food Insecurity: Not on file  Transportation Needs: Not on file  Physical Activity: Not on file  Stress: Not on file  Social Connections: Not on file  Intimate Partner Violence: Not on file    ALLERGIES: Patient has no known allergies.  MEDICATIONS:  Current Outpatient Medications  Medication Sig Dispense Refill   ACCU-CHEK AVIVA PLUS test strip      Accu-Chek Softclix Lancets lancets SMARTSIG:Topical     amLODipine (NORVASC) 5 MG tablet Take 5 mg by mouth daily.     Cholecalciferol (VITAMIN D3) 50 MCG (2000 UT) TABS Take 2,000 Units by mouth daily.     dorzolamide-timolol (COSOPT) 22.3-6.8 MG/ML ophthalmic solution 1 drop 2 (two) times daily.     gabapentin (NEURONTIN) 100 MG capsule Take 100 mg by mouth 3 (three) times daily.     latanoprost (XALATAN) 0.005 % ophthalmic solution Place 1 drop into both eyes at bedtime.     lidocaine (XYLOCAINE) 2 % jelly Apply 1 application. topically 3 (three) times daily as needed.     lidocaine (XYLOCAINE) 5 % ointment 1 application. daily as needed for mild pain or moderate pain.     lisinopril-hydrochlorothiazide (ZESTORETIC) 20-25 MG tablet Take 1 tablet by mouth daily.     pantoprazole (PROTONIX) 40 MG tablet Take 40 mg by mouth daily.     No current facility-administered medications for this encounter.    REVIEW OF SYSTEMS:  On review of systems, the patient reports that he is doing well overall. He denies any chest pain, shortness of breath, cough, fevers, chills, night sweats, unintended weight  changes. He denies any bowel disturbances, and denies abdominal pain, nausea or vomiting. He denies any new musculoskeletal or joint aches or pains. His IPSS was 7, indicating mild urinary symptoms.  He is not currently sexually active. A complete review of systems is obtained and is otherwise negative.    PHYSICAL EXAM:  Wt Readings from Last 3 Encounters:  03/25/22 138 lb 6.4 oz (62.8 kg)  01/14/22 125 lb (56.7 kg)  12/26/21 125 lb (56.7 kg)   Temp Readings from Last 3 Encounters:  03/25/22 97.7 F (36.5 C)  01/14/22 97.8 F (36.6 C)  12/27/21 (!) 97.5 F (36.4 C) (Oral)   BP Readings from Last 3 Encounters:  03/25/22 (!) 149/73  01/14/22 130/78  12/27/21 (!) 113/45   Pulse  Readings from Last 3 Encounters:  03/25/22 69  01/14/22 72  12/27/21 66   Pain Assessment Pain Score: 6  Pain Loc: Toe (Between great toe and second toe on right foot.)/10  In general this is a well appearing African-American male in no acute distress. He's alert and oriented x4 and appropriate throughout the examination. Cardiopulmonary assessment is negative for acute distress, and he exhibits normal effort.     KPS = 90  100 - Normal; no complaints; no evidence of disease. 90   - Able to carry on normal activity; minor signs or symptoms of disease. 80   - Normal activity with effort; some signs or symptoms of disease. 46   - Cares for self; unable to carry on normal activity or to do active work. 60   - Requires occasional assistance, but is able to care for most of his personal needs. 50   - Requires considerable assistance and frequent medical care. 67   - Disabled; requires special care and assistance. 71   - Severely disabled; hospital admission is indicated although death not imminent. 33   - Very sick; hospital admission necessary; active supportive treatment necessary. 10   - Moribund; fatal processes progressing rapidly. 0     - Dead  Karnofsky DA, Abelmann [place], Craver LS and [place]chenal  [facility] [phone]) The use of the nitrogen mustards in the palliative treatment of carcinoma: with particular reference to bronchogenic carcinoma Cancer 1 634-56  LABORATORY DATA:  Lab Results  Component Value Date   WBC 8.5 12/27/2021   HGB 9.7 (L) 12/27/2021   HCT 29.8 (L) 12/27/2021   MCV 91.7 12/27/2021   PLT 221 12/27/2021   Lab Results  Component Value Date   NA 141 12/27/2021   K 3.2 (L) 12/27/2021   CL 109 12/27/2021   CO2 25 12/27/2021   Lab Results  Component Value Date   ALT 6 12/25/2021   AST 16 12/25/2021   ALKPHOS 55 12/25/2021   BILITOT 0.6 12/25/2021     RADIOGRAPHY: NM Bone Scan Whole Body  Result Date: 03/23/2022 CLINICAL DATA:  Prostate carcinoma staging. EXAM: NUCLEAR MEDICINE WHOLE BODY BONE SCAN TECHNIQUE: Whole body anterior and posterior images were obtained approximately 3 hours after intravenous injection of radiopharmaceutical. RADIOPHARMACEUTICALS:  19.6 mCi Technetium-81mMDP IV COMPARISON:  CT abdomen and pelvis, 03/21/2022. FINDINGS: There are no areas of abnormal radiotracer localization to bone to suggest metastatic disease. Uptake is seen involving the shoulders, sternoclavicular joints, minimally along the spine, at the hip and knee joints, as well as the feet, left greater than right. Renal uptake is symmetric. IMPRESSION: 1. No evidence of metastatic disease to bone. 2. Multiple areas of degenerative uptake. Electronically Signed   By: DLajean ManesM.D.   On: 03/23/2022 15:48      IMPRESSION/PLAN: 1. 88y.o. gentleman with Stage T1c adenocarcinoma of the prostate with Gleason Score of 4+3, and PSA of 19.36. We discussed the patient's workup and outlined the nature of prostate cancer in this setting. The patient's T stage, Gleason's score, and PSA put him into the unfavorable intermediate risk group. Accordingly, he is eligible for a variety of potential treatment options including brachytherapy, 5.5 weeks of external radiation or prostatectomy. We  discussed the available radiation techniques, and focused on the details and logistics of delivery. He is not a surgical candidate given his advanced age. Therefore, we discussed and outlined the risks, benefits, short and long-term effects associated with radiotherapy and compared and contrasted these  with prostatectomy. We discussed the role of SpaceOAR gel in reducing the rectal toxicity associated with radiotherapy. We do not recommend ADT with his intermediate risk disease at age 4, as this would likely cause more harm than good, particularly in light of his vascular disease and diabetes.  He was encouraged to ask questions that were answered to his stated satisfaction.  At the conclusion of our conversation, the patient is interested in moving forward with 5.5 weeks of external beam therapy alone. We will share our discussion with Dr. Claudia [name] and make arrangements for fiducial markers and SpaceOAR gel placement, prior to simulation, to reduce rectal toxicity from radiotherapy. The patient appears to have a good understanding of his disease and our treatment recommendations which are of curative intent and is in agreement with the stated plan.  Therefore, we will move forward with treatment planning accordingly, in anticipation of beginning IMRT in the near future.  We personally spent 70 minutes in this encounter including chart review, reviewing radiological studies, meeting face-to-face with the patient, entering orders and completing documentation.    Nicholos Johns, PA-C    Tyler Pita, MD  [facility] Direct Dial: [phone]  Fax: [phone] [email or web address]  Skype  LinkedIn

## 2022-03-25 NOTE — Progress Notes (Signed)
Introduced myself to the patient as the prostate nurse navigator.  No barriers to care identified at this time.  He is here to discuss his radiation treatment options.  I gave him my business card and asked him to call me with questions or concerns.  Verbalized understanding.  ?

## 2022-03-26 ENCOUNTER — Ambulatory Visit (INDEPENDENT_AMBULATORY_CARE_PROVIDER_SITE_OTHER): Payer: Medicare Other | Admitting: Podiatry

## 2022-03-26 DIAGNOSIS — E119 Type 2 diabetes mellitus without complications: Secondary | ICD-10-CM

## 2022-03-27 DIAGNOSIS — Z191 Hormone sensitive malignancy status: Secondary | ICD-10-CM | POA: Diagnosis not present

## 2022-03-29 NOTE — Progress Notes (Signed)
Chief Complaint  Patient presents with   Callouses    Patient states that he has a corn/ callous on right foot between great toe and second toe, the patient states that it hurts really bad    Subjective:  88 y.o. male with PMHx significant for PAD, prior amputated toe, diabetes presenting for a symptomatic callus to the interdigital area of the right foot.  Patient known to our practice and previously seen on 03/10/2022 by another physician.  Requesting different physician evaluation.  Patient states that he developed a symptomatic callus to the interdigital area between the great toe and the second digit of the right foot.  Is very symptomatic especially in close toed shoes.  He presents for further treatment and evaluation  Past Medical History:  Diagnosis Date   Arthritis    "generalized" (03/01/2013)   CAD (coronary artery disease)    Cataract    GERD (gastroesophageal reflux disease)    History of cardiac cath 03/01/2013   HOH (hard of hearing)    Hyperlipidemia    Myocardial infarction ([place]) 06/16/2002   PONV (postoperative nausea and vomiting)    Type II diabetes mellitus ([place])    Unspecified essential hypertension     Past Surgical History:  Procedure Laterality Date   ABDOMINAL AORTOGRAM W/LOWER EXTREMITY N/A 10/11/2021   Procedure: ABDOMINAL AORTOGRAM W/LOWER EXTREMITY;  Surgeon: [name], MD;  Location: [facility];  Service: Cardiovascular;  Laterality: N/A;  Left Lower Extremity   AMPUTATION Left 12/26/2021   Procedure: LEFT FIRST TOE RAY AMPUTATION;  Surgeon: [name], MD;  Location: [facility];  Service: Vascular;  Laterality: Left;   BACK SURGERY  1970s   CATARACT EXTRACTION W/ INTRAOCULAR LENS IMPLANT Left 06/17/2011   CHOLECYSTECTOMY N/A 03/02/2013   Procedure: LAPAROSCOPIC CHOLECYSTECTOMY;  Surgeon: Rolm Bookbinder, MD;  Location: [facility];  Service: General;  Laterality: N/A;   CORONARY ANGIOPLASTY WITH STENT PLACEMENT     "had 2-3; they  clogged; he later had OHS" (03/01/2013)   CORONARY ARTERY BYPASS GRAFT  02/15/2003   "triple" (03/01/2013)   FEMORAL-POPLITEAL BYPASS GRAFT Left 10/24/2021   Procedure: BYPASS LEFT COMMON FEMORAL-BELOW KNEE POPLITEAL ARTERY WITH 6MM BY 80CM RING GRAFT;  Surgeon: [name], MD;  [facility];  Service: Vascular;  Laterality: Left;   TRANSTHORACIC ECHOCARDIOGRAM  12/18/2021   Poor windows, but relatively normal study.  Normal LV size and function with EF 55 to 60%.  No RWMA.  Mild LVH.  GR 1 DD.  Normal RV size and function.  Normal RAP (unable to assess RVP.)  Normal MV.  AOV sclerosis with no stenosis.   No Known Allergies     Objective/Physical Exam General: The patient is alert and oriented x3 in no acute distress.  Dermatology:  Hyperkeratotic symptomatic corn/callus noted to the medial aspect of the right second toe adjacent the hallux.  Associated tenderness to palpation.  Vascular: Palpable pedal pulses bilaterally. No edema or erythema noted. Capillary refill within normal limits.  Clinically no indication or evidence of vascular compromise  Neurological: Grossly intact via light touch  Musculoskeletal Exam: No prior amputations noted  Assessment: 1.  Symptomatic interdigital corn right foot 2.  Encounter for diabetic foot exam   Plan of Care:  1. Patient was evaluated.  Comprehensive diabetic foot exam performed today 2.  Excisional debridement of the hyperkeratotic symptomatic corn was performed to the medial aspect of the right second digit.  The patient did feel relief 3.  Continue interdigital corn pads to help alleviate pressure from this area 4.  Recommend wide fitting shoes that do not constrict the toebox area 5.  Return to clinic as needed  Edrick Kins, DPM [facility]  Dr. Edrick Kins, DPM    [address], [place] [address]                Office [phone]  Fax [phone]

## 2022-04-01 ENCOUNTER — Ambulatory Visit: Payer: Medicare Other | Admitting: Vascular Surgery

## 2022-04-01 ENCOUNTER — Ambulatory Visit: Payer: Medicare Other

## 2022-04-01 ENCOUNTER — Ambulatory Visit: Payer: Medicare Other | Admitting: Radiation Oncology

## 2022-04-01 ENCOUNTER — Other Ambulatory Visit (HOSPITAL_COMMUNITY): Payer: Medicare Other

## 2022-04-01 ENCOUNTER — Encounter (HOSPITAL_COMMUNITY): Payer: Medicare Other

## 2022-04-03 ENCOUNTER — Telehealth: Payer: Self-pay | Admitting: Podiatry

## 2022-04-03 NOTE — Telephone Encounter (Signed)
Patient daughter called patient is still having pain from  the corn between his toes, she wants to know what the next step is if she brings him back in?  Explained to patients daughter that if he comes back in before 9 weeks he will be responsible for the bill, that it would not be covered by medicare. Please advise

## 2022-04-04 ENCOUNTER — Telehealth: Payer: Self-pay | Admitting: *Deleted

## 2022-04-04 ENCOUNTER — Other Ambulatory Visit: Payer: Self-pay | Admitting: Urology

## 2022-04-04 NOTE — Telephone Encounter (Signed)
CALLED PATIENT'S DAUGHTER- DIONNE WILLIAMS (PATIENT'S DAUGHTER) TO INFORM OF FID. MARKER AND SPACE OAR PLACEMENT ON 04-22-22 AND HIS SIM ON 04-24-22- ARRIVAL TIME- 10:45 AM @ [facility], INFORMED PATIENT'S DAUGHTER TO HAVE HER DAD ARRIVE WITH A FULL BLADDER, SPOKE WITH PATIENT'S DAUGHTER- DIONNE WILLIAMS AND SHE IS AWARE OF THESE APPTS. AND THE INSTRUCTIONS

## 2022-04-07 ENCOUNTER — Ambulatory Visit (INDEPENDENT_AMBULATORY_CARE_PROVIDER_SITE_OTHER): Payer: Medicare Other | Admitting: Podiatry

## 2022-04-07 DIAGNOSIS — L97511 Non-pressure chronic ulcer of other part of right foot limited to breakdown of skin: Secondary | ICD-10-CM | POA: Diagnosis not present

## 2022-04-07 NOTE — Progress Notes (Signed)
Chief Complaint  Patient presents with   Callouses    Patient is here for right foot corn great toe and 2nd toe.    Subjective:  88 y.o. male with PMHx significant for PAD, prior amputated toe, diabetes presenting for a symptomatic callus to the interdigital area of the right foot.  Patient states that he continues to have pain and tenderness associated to the callus to the second toe of the right foot.  The debridement only helped for a few days.  Past Medical History:  Diagnosis Date   Arthritis    "generalized" (03/01/2013)   CAD (coronary artery disease)    Cataract    GERD (gastroesophageal reflux disease)    History of cardiac cath 03/01/2013   HOH (hard of hearing)    Hyperlipidemia    Myocardial infarction ([place]) 06/16/2002   PONV (postoperative nausea and vomiting)    Type II diabetes mellitus ([place])    Unspecified essential hypertension     Past Surgical History:  Procedure Laterality Date   ABDOMINAL AORTOGRAM W/LOWER EXTREMITY N/A 10/11/2021   Procedure: ABDOMINAL AORTOGRAM W/LOWER EXTREMITY;  Surgeon: [name], MD;  Location: [facility];  Service: Cardiovascular;  Laterality: N/A;  Left Lower Extremity   AMPUTATION Left 12/26/2021   Procedure: LEFT FIRST TOE RAY AMPUTATION;  Surgeon: [name], MD;  Location: [place];  Service: Vascular;  Laterality: Left;   BACK SURGERY  1970s   CATARACT EXTRACTION W/ INTRAOCULAR LENS IMPLANT Left 06/17/2011   CHOLECYSTECTOMY N/A 03/02/2013   Procedure: LAPAROSCOPIC CHOLECYSTECTOMY;  Surgeon: Rolm Bookbinder, MD;  Location: [facility];  Service: General;  Laterality: N/A;   CORONARY ANGIOPLASTY WITH STENT PLACEMENT     "had 2-3; they clogged; he later had OHS" (03/01/2013)   CORONARY ARTERY BYPASS GRAFT  02/15/2003   "triple" (03/01/2013)   FEMORAL-POPLITEAL BYPASS GRAFT Left 10/24/2021   Procedure: BYPASS LEFT COMMON FEMORAL-BELOW KNEE POPLITEAL ARTERY WITH 6MM BY 80CM RING GRAFT;  Surgeon: [name], MD;   [facility];  Service: Vascular;  Laterality: Left;   TRANSTHORACIC ECHOCARDIOGRAM  12/18/2021   Poor windows, but relatively normal study.  Normal LV size and function with EF 55 to 60%.  No RWMA.  Mild LVH.  GR 1 DD.  Normal RV size and function.  Normal RAP (unable to assess RVP.)  Normal MV.  AOV sclerosis with no stenosis.   No Known Allergies      Objective/Physical Exam General: The patient is alert and oriented x3 in no acute distress.  Dermatology:  Hyperkeratotic symptomatic corn/callus noted to the medial aspect of the right second toe adjacent the hallux.  Associated tenderness to palpation.  Vascular: Palpable pedal pulses bilaterally. No edema or erythema noted. Capillary refill within normal limits.  Clinically no indication or evidence of vascular compromise  Neurological: Grossly intact via light touch  Musculoskeletal Exam: No prior amputations noted  Assessment: 1.  Symptomatic interdigital corn right foot 2.  Encounter for diabetic foot exam   Plan of Care:  1. Patient was evaluated.   2. Additional excisional debridement of the hyperkeratotic symptomatic corn was performed to the medial aspect of the right second digit.  The patient did feel relief 3.  Continue interdigital corn pads to help alleviate pressure from this area.  Silicone toe spacers were dispensed and provided for the patient 4.  Recommend wide fitting shoes that do not constrict the toebox area 5.  Return to clinic as needed  Dorathy Daft.  Amalia Hailey, DPM [facility]  Dr. Edrick Kins, DPM    [address], [place] [address]                Office [phone]  Fax [phone]

## 2022-04-07 NOTE — Telephone Encounter (Signed)
Not much to do.  I would not recommend surgery.  Continue with the interdigital corn pads and wide shoes that do not constrict the toebox area.  Thanks, Dr. Amalia Hailey

## 2022-04-18 ENCOUNTER — Encounter (HOSPITAL_BASED_OUTPATIENT_CLINIC_OR_DEPARTMENT_OTHER): Payer: Self-pay | Admitting: Urology

## 2022-04-18 NOTE — Progress Notes (Addendum)
Spoke w/ via phone for pre-op interview--- pt's daughter, Harold [name] (pt is very [place]) Lab needs dos----   [employer] results------ current EKG in epic/ chart COVID test -----patient states asymptomatic no test needed Arrive at ------- 1200 on 04-22-2022 NPO after MN NO Solid Food.  Clear liquids from MN until--- 1100 Med rec completed Medications to take morning of surgery ----- norvasc, protonix, gabapentin Diabetic medication ----- n/a Patient instructed no nail polish to be worn day of surgery Patient instructed to bring photo id and insurance card day of surgery Patient aware to have Driver (ride ) / caregiver for 24 hours after surgery -- daughter, Harold [name] Patient Special Instructions ----- pt given instructions from office to do one fleet enema morning of surgery Pre-Op special Istructions ----- daughter will need to be w/ pt in pre-op, she also has HPOA (to bring copy dos) Patient verbalized understanding of instructions that were given at this phone interview. Patient denies shortness of breath, chest pain, fever, cough at this phone interview.   Anesthesia Review:  HTN;  CAD hx MI 2001 s/p PCI and x2 stents and 09/ 2004 s/p CABG x3;  PAD s/p FPBG 05/ 2023 Per daughter pt denies cardiac s&s, does get sob w/ stairs and long distance walk but recovers quickly and ok w/ household chores.  PCP: Dr Inda Merlin Cardiologist : Dr Ellyn Hack [facility] 12-20-2021) Chest x-ray : 12-25-2021 EKG : 12-16-2021 Echo : 12-18-2021 Stress test:  no Cardiac Cath : 07/ 2001 not available in epic  Fasting Blood Sugar :     / Checks Blood Sugar -- times a day:  once wkly Blood Thinner/ Instructions /Last Dose:no ASA / Instructions/ Last Dose : no

## 2022-04-22 ENCOUNTER — Encounter (HOSPITAL_BASED_OUTPATIENT_CLINIC_OR_DEPARTMENT_OTHER)
Admission: RE | Disposition: A | Discharge status: Home / Self Care | Payer: Self-pay | Source: Ambulatory Visit | Attending: Urology

## 2022-04-22 ENCOUNTER — Other Ambulatory Visit: Payer: Self-pay

## 2022-04-22 ENCOUNTER — Ambulatory Visit (HOSPITAL_BASED_OUTPATIENT_CLINIC_OR_DEPARTMENT_OTHER): Payer: Medicare Other | Admitting: Anesthesiology

## 2022-04-22 ENCOUNTER — Telehealth: Payer: Self-pay | Admitting: *Deleted

## 2022-04-22 ENCOUNTER — Encounter (HOSPITAL_BASED_OUTPATIENT_CLINIC_OR_DEPARTMENT_OTHER): Payer: Self-pay | Admitting: Urology

## 2022-04-22 ENCOUNTER — Ambulatory Visit (HOSPITAL_BASED_OUTPATIENT_CLINIC_OR_DEPARTMENT_OTHER)
Admission: RE | Admit: 2022-04-22 | Discharge: 2022-04-22 | Disposition: A | Discharge status: Home / Self Care | Payer: Medicare Other | Source: Ambulatory Visit | Attending: Urology | Admitting: Urology

## 2022-04-22 DIAGNOSIS — Z6820 Body mass index (BMI) 20.0-20.9, adult: Secondary | ICD-10-CM | POA: Diagnosis not present

## 2022-04-22 DIAGNOSIS — C61 Malignant neoplasm of prostate: Secondary | ICD-10-CM

## 2022-04-22 DIAGNOSIS — I10 Essential (primary) hypertension: Secondary | ICD-10-CM | POA: Diagnosis not present

## 2022-04-22 DIAGNOSIS — Z89422 Acquired absence of other left toe(s): Secondary | ICD-10-CM | POA: Insufficient documentation

## 2022-04-22 DIAGNOSIS — Z87891 Personal history of nicotine dependence: Secondary | ICD-10-CM | POA: Diagnosis not present

## 2022-04-22 DIAGNOSIS — I2510 Atherosclerotic heart disease of native coronary artery without angina pectoris: Secondary | ICD-10-CM | POA: Diagnosis not present

## 2022-04-22 DIAGNOSIS — E1151 Type 2 diabetes mellitus with diabetic peripheral angiopathy without gangrene: Secondary | ICD-10-CM | POA: Insufficient documentation

## 2022-04-22 DIAGNOSIS — Z951 Presence of aortocoronary bypass graft: Secondary | ICD-10-CM | POA: Diagnosis not present

## 2022-04-22 DIAGNOSIS — R634 Abnormal weight loss: Secondary | ICD-10-CM | POA: Insufficient documentation

## 2022-04-22 DIAGNOSIS — Z01818 Encounter for other preprocedural examination: Secondary | ICD-10-CM

## 2022-04-22 DIAGNOSIS — Z955 Presence of coronary angioplasty implant and graft: Secondary | ICD-10-CM | POA: Insufficient documentation

## 2022-04-22 HISTORY — DX: Shortness of breath: R06.02

## 2022-04-22 HISTORY — DX: Other constipation: K59.09

## 2022-04-22 HISTORY — PX: SPACE OAR INSTILLATION: SHX6769

## 2022-04-22 HISTORY — DX: Type 2 diabetes mellitus without complications: E11.9

## 2022-04-22 HISTORY — DX: Dependence on other enabling machines and devices: Z99.89

## 2022-04-22 HISTORY — PX: GOLD SEED IMPLANT: SHX6343

## 2022-04-22 HISTORY — DX: Presence of spectacles and contact lenses: Z97.3

## 2022-04-22 HISTORY — DX: Complete loss of teeth, unspecified cause, unspecified class: Z97.2

## 2022-04-22 HISTORY — DX: Hyperlipidemia, unspecified: E78.5

## 2022-04-22 HISTORY — DX: Malignant neoplasm of prostate: C61

## 2022-04-22 HISTORY — DX: Peripheral vascular disease, unspecified: I73.9

## 2022-04-22 HISTORY — DX: Unspecified glaucoma: H40.9

## 2022-04-22 HISTORY — DX: Complete loss of teeth, unspecified cause, unspecified class: K08.109

## 2022-04-22 HISTORY — DX: Unspecified osteoarthritis, unspecified site: M19.90

## 2022-04-22 HISTORY — DX: Unspecified right bundle-branch block: I45.10

## 2022-04-22 HISTORY — DX: Essential (primary) hypertension: I10

## 2022-04-22 HISTORY — DX: Nocturia: R35.1

## 2022-04-22 LAB — POCT I-STAT, CHEM 8
BUN: 19 mg/dL (ref 8–23)
Calcium, Ion: 1.35 mmol/L (ref 1.15–1.40)
Chloride: 103 mmol/L (ref 98–111)
Creatinine, Ser: 1.1 mg/dL (ref 0.61–1.24)
Glucose, Bld: 158 mg/dL — ABNORMAL HIGH (ref 70–99)
HCT: 39 % (ref 39.0–52.0)
Hemoglobin: 13.3 g/dL (ref 13.0–17.0)
Potassium: 4 mmol/L (ref 3.5–5.1)
Sodium: 142 mmol/L (ref 135–145)
TCO2: 27 mmol/L (ref 22–32)

## 2022-04-22 LAB — GLUCOSE, CAPILLARY: Glucose-Capillary: 130 mg/dL — ABNORMAL HIGH (ref 70–99)

## 2022-04-22 SURGERY — INSERTION, GOLD SEEDS
Anesthesia: General | Site: Prostate

## 2022-04-22 MED ORDER — ONDANSETRON HCL 4 MG/2ML IJ SOLN
INTRAMUSCULAR | Status: DC | PRN
  Administered 2022-04-22: 4 mg via INTRAVENOUS

## 2022-04-22 MED ORDER — ACETAMINOPHEN 500 MG PO TABS
1000.0000 mg | ORAL_TABLET | Freq: Once | ORAL | Status: AC
  Administered 2022-04-22: 1000 mg via ORAL

## 2022-04-22 MED ORDER — ACETAMINOPHEN 500 MG PO TABS
ORAL_TABLET | ORAL | Status: AC
  Filled 2022-04-22: qty 2

## 2022-04-22 MED ORDER — OXYCODONE HCL 5 MG/5ML PO SOLN: 5.0000 mg | Freq: Once | ORAL | Status: DC | PRN

## 2022-04-22 MED ORDER — LACTATED RINGERS IV SOLN: INTRAVENOUS | Status: DC

## 2022-04-22 MED ORDER — BUPIVACAINE HCL 0.25 % IJ SOLN
INTRAMUSCULAR | Status: DC | PRN
  Administered 2022-04-22: 10 mL

## 2022-04-22 MED ORDER — CEFAZOLIN SODIUM-DEXTROSE 2-4 GM/100ML-% IV SOLN
2.0000 g | INTRAVENOUS | Status: AC
  Administered 2022-04-22: 2 g via INTRAVENOUS

## 2022-04-22 MED ORDER — OXYCODONE HCL 5 MG PO TABS: 5.0000 mg | ORAL_TABLET | Freq: Once | ORAL | Status: DC | PRN

## 2022-04-22 MED ORDER — PROPOFOL 10 MG/ML IV BOLUS
INTRAVENOUS | Status: AC
  Filled 2022-04-22: qty 20

## 2022-04-22 MED ORDER — FENTANYL CITRATE (PF) 100 MCG/2ML IJ SOLN
INTRAMUSCULAR | Status: AC
  Filled 2022-04-22: qty 2

## 2022-04-22 MED ORDER — PHENYLEPHRINE 80 MCG/ML (10ML) SYRINGE FOR IV PUSH (FOR BLOOD PRESSURE SUPPORT)
PREFILLED_SYRINGE | INTRAVENOUS | Status: DC | PRN
  Administered 2022-04-22: 160 ug via INTRAVENOUS
  Administered 2022-04-22 (×2): 80 ug via INTRAVENOUS
  Administered 2022-04-22 (×2): 160 ug via INTRAVENOUS
  Administered 2022-04-22: 80 ug via INTRAVENOUS

## 2022-04-22 MED ORDER — SODIUM CHLORIDE 0.9% FLUSH
INTRAVENOUS | Status: DC | PRN
  Administered 2022-04-22: 20 mL

## 2022-04-22 MED ORDER — FENTANYL CITRATE (PF) 100 MCG/2ML IJ SOLN
INTRAMUSCULAR | Status: DC | PRN
  Administered 2022-04-22: 50 ug via INTRAVENOUS

## 2022-04-22 MED ORDER — CEFAZOLIN SODIUM-DEXTROSE 2-4 GM/100ML-% IV SOLN
INTRAVENOUS | Status: AC
  Filled 2022-04-22: qty 100

## 2022-04-22 MED ORDER — LIDOCAINE HCL (PF) 2 % IJ SOLN
INTRAMUSCULAR | Status: AC
  Filled 2022-04-22: qty 5

## 2022-04-22 MED ORDER — PHENYLEPHRINE 80 MCG/ML (10ML) SYRINGE FOR IV PUSH (FOR BLOOD PRESSURE SUPPORT)
PREFILLED_SYRINGE | INTRAVENOUS | Status: AC
  Filled 2022-04-22: qty 10

## 2022-04-22 MED ORDER — PROPOFOL 500 MG/50ML IV EMUL
INTRAVENOUS | Status: DC | PRN
  Administered 2022-04-22: 150 ug/kg/min via INTRAVENOUS

## 2022-04-22 MED ORDER — PROPOFOL 10 MG/ML IV BOLUS
INTRAVENOUS | Status: DC | PRN
  Administered 2022-04-22: 20 mg via INTRAVENOUS
  Administered 2022-04-22: 90 mg via INTRAVENOUS

## 2022-04-22 MED ORDER — ONDANSETRON HCL 4 MG/2ML IJ SOLN
INTRAMUSCULAR | Status: AC
  Filled 2022-04-22: qty 2

## 2022-04-22 MED ORDER — FENTANYL CITRATE (PF) 100 MCG/2ML IJ SOLN: 25.0000 ug | INTRAMUSCULAR | Status: DC | PRN

## 2022-04-22 MED ORDER — PROPOFOL 1000 MG/100ML IV EMUL
INTRAVENOUS | Status: AC
  Filled 2022-04-22: qty 100

## 2022-04-22 MED ORDER — LIDOCAINE 2% (20 MG/ML) 5 ML SYRINGE
INTRAMUSCULAR | Status: DC | PRN
  Administered 2022-04-22: 50 mg via INTRAVENOUS

## 2022-04-22 SURGICAL SUPPLY — 25 items
BLADE CLIPPER SENSICLIP SURGIC (BLADE) ×1 IMPLANT
CNTNR URN SCR LID CUP LEK RST (MISCELLANEOUS) ×1 IMPLANT
CONT SPEC 4OZ STRL OR WHT (MISCELLANEOUS) ×1
COVER BACK TABLE 60X90IN (DRAPES) ×1 IMPLANT
DRSG TEGADERM 4X4.75 (GAUZE/BANDAGES/DRESSINGS) ×1 IMPLANT
DRSG TEGADERM 8X12 (GAUZE/BANDAGES/DRESSINGS) ×1 IMPLANT
GAUZE SPONGE 4X4 12PLY STRL (GAUZE/BANDAGES/DRESSINGS) ×1 IMPLANT
GLOVE BIO SURGEON STRL SZ 6.5 (GLOVE) ×1 IMPLANT
GLOVE BIO SURGEON STRL SZ7.5 (GLOVE) ×1 IMPLANT
GLOVE ECLIPSE 8.0 STRL XLNG CF (GLOVE) ×1 IMPLANT
IMPL SPACEOAR VUE SYSTEM (Spacer) ×1 IMPLANT
IMPLANT SPACEOAR VUE SYSTEM (Spacer) ×1 IMPLANT
KIT TURNOVER CYSTO (KITS) ×1 IMPLANT
MARKER GOLD PRELOAD 1.2X3 (Urological Implant) ×1 IMPLANT
MARKER SKIN DUAL TIP RULER LAB (MISCELLANEOUS) ×1 IMPLANT
NDL SPNL 22GX3.5 QUINCKE BK (NEEDLE) ×1 IMPLANT
NEEDLE SPNL 22GX3.5 QUINCKE BK (NEEDLE) ×1 IMPLANT
SEED GOLD PRELOAD 1.2X3 (Urological Implant) ×1 IMPLANT
SHEATH ULTRASOUND LF (SHEATH) IMPLANT
SHEATH ULTRASOUND LTX NONSTRL (SHEATH) IMPLANT
SURGILUBE 2OZ TUBE FLIPTOP (MISCELLANEOUS) ×1 IMPLANT
SYR 10ML LL (SYRINGE) IMPLANT
SYR CONTROL 10ML LL (SYRINGE) ×1 IMPLANT
TOWEL OR 17X26 10 PK STRL BLUE (TOWEL DISPOSABLE) ×1 IMPLANT
UNDERPAD 30X36 HEAVY ABSORB (UNDERPADS AND DIAPERS) ×1 IMPLANT

## 2022-04-22 NOTE — Anesthesia Preprocedure Evaluation (Addendum)
Anesthesia Evaluation  Patient identified by MRN, date of birth, ID band Patient awake    Reviewed: Allergy & Precautions, NPO status , Patient's Chart, lab work & pertinent test results  History of Anesthesia Complications (+) PONV and history of anesthetic complications  Airway Mallampati: II  TM Distance: >3 FB Neck ROM: Full    Dental  (+) Edentulous Upper, Missing,    Pulmonary former smoker   Pulmonary exam normal        Cardiovascular hypertension, Pt. on medications + CAD, + Cardiac Stents (2001), + CABG (2004) and + Peripheral Vascular Disease  Normal cardiovascular exam     Neuro/Psych negative neurological ROS     GI/Hepatic Neg liver ROS,GERD  Medicated and Controlled,,  Endo/Other  diabetes, Type 2    Renal/GU negative Renal ROS   Prostate ca    Musculoskeletal  (+) Arthritis ,    Abdominal   Peds  Hematology negative hematology ROS (+)   Anesthesia Other Findings Day of surgery medications reviewed with patient.  Reproductive/Obstetrics                             Anesthesia Physical Anesthesia Plan  ASA: 3  Anesthesia Plan: General   Post-op Pain Management: Tylenol PO (pre-op)*   Induction: Intravenous  PONV Risk Score and Plan: 3 and Treatment may vary due to age or medical condition, Propofol infusion, TIVA, Ondansetron and Dexamethasone  Airway Management Planned:   Additional Equipment: None  Intra-op Plan:   Post-operative Plan: Extubation in OR  Informed Consent: I have reviewed the patients History and Physical, chart, labs and discussed the procedure including the risks, benefits and alternatives for the proposed anesthesia with the patient or authorized representative who has indicated his/her understanding and acceptance.     Dental advisory given  Plan Discussed with: CRNA  Anesthesia Plan Comments:        Anesthesia Quick  Evaluation

## 2022-04-22 NOTE — Transfer of Care (Signed)
Immediate Anesthesia Transfer of Care Note  Patient: Harold [name]  Procedure(s) Performed: Procedure(s) (LRB): GOLD SEED IMPLANT (N/A) SPACE OAR INSTILLATION (N/A)  Patient Location: PACU  Anesthesia Type: General  Level of Consciousness: drowsy  Airway & Oxygen Therapy: Patient Spontanous Breathing and Patient connected to face mask oxygen, oral airway remaining.  Post-op Assessment: Report given to PACU RN and Post -op Vital signs reviewed and stable  Post vital signs: Reviewed and stable  Complications: No apparent anesthesia complications  Last Vitals:  Vitals Value Taken Time  BP 130/55 04/22/22 1515  Temp    Pulse 69 04/22/22 1516  Resp 16 04/22/22 1516  SpO2 100 % 04/22/22 1516  Vitals shown include unvalidated device data.  Last Pain:  Vitals:   04/22/22 1255  TempSrc: Oral  PainSc: 0-No pain         Complications: No notable events documented.

## 2022-04-22 NOTE — Op Note (Signed)
Preoperative diagnosis: Clinically localized adenocarcinoma of the prostate  Postoperative diagnosis: Clinically localized adenocarcinoma of the prostate  Procedure: 1) Placement of fiducial markers into prostate                    2) Insertion of SpaceOAR hydrogel   Surgeon: Jacalyn Lefevre, MD   Anesthesia: General  EBL: Minimal  Complications: None  Indication: Harold [name] is a 88 y.o. gentleman with clinically localized prostate cancer. After discussing management options for treatment, he elected to proceed with radiotherapy. He presents today for the above procedures. The potential risks, complications, alternative options, and expected recovery course have been discussed in detail with the patient and he has provided informed consent to proceed.  Description of procedure: The patient was administered preoperative antibiotics, placed in the dorsal lithotomy position, and prepped and draped in the usual sterile fashion. Next, transrectal ultrasonography was utilized to visualize the prostate. Three gold fiducial markers were then placed into the prostate via transperineal needles under ultrasound guidance at the right apex, right base, and left mid gland under direct ultrasound guidance. A site in the midline was then selected on the perineum for placement of an 18 g needle with saline. The needle was advanced above the rectum and below Denonvillier's fascia to the mid gland and confirmed to be in the midline on transverse imaging. One cc of saline was injected confirming appropriate expansion of this space. A total of 5 cc of saline was then injected to open the space further bilaterally. The saline syringe was then removed and the SpaceOAR hydrogel was injected with good distribution bilaterally. He tolerated the procedure well and without complications. He was given a voiding trial prior to discharge from the PACU.

## 2022-04-22 NOTE — Telephone Encounter (Signed)
Called patient to remind of sim appt. for 04-24-22- arrival time- 10:45 am @ [facility], informed patient to arrive with a full bladder, spoke with patient's daughter Rocky Morel and she is aware of this appt. and the instructions

## 2022-04-22 NOTE — Discharge Instructions (Addendum)
Transrectal Ultrasound-Guided Prostate Gold Seed and Biodegradable Gel Placement, Care After  The following information offers guidance on how to care for yourself after your procedure. Your health care provider may also give you more specific instructions. If you have problems or questions, contact your health care provider. What can I expect after the procedure? After the procedure, it is common to have: Light bleeding from the rectum. Bruising or tenderness in the area behind the scrotum (perineum), if the needle was put into your prostate through this area. Small amounts of blood in your urine. This should only last for a few days. Light brown or red semen. This may last for a couple of weeks. Follow these instructions at home: Medicines A prescription pill bottle with an example of a pill.  Take over-the-counter and prescription medicines only as told by your health care provider. If you were prescribed an antibiotic, take it as told by your health care provider. Do not stop taking the antibiotic early, even if you start to feel better. Ask your health care provider if the medicine prescribed to you requires you to avoid driving or using machinery. Eating and drinking Follow instructions from your health care provider about eating or drinking restrictions. Drink enough fluid to keep your urine pale yellow. Managing pain and swelling If directed, put ice on the affected area. To do this: Put ice in a plastic bag. Place a towel between your skin and the bag. Leave the ice on for 20 minutes, 2-3 times a day. Be sure to remove the ice if your skin turns bright red. If you cannot feel pain, heat, or cold, you have a greater risk of damage to the area. Try not to sit directly on the area behind the scrotum. A soft cushion can help with discomfort. Activity If you were given a sedative during the procedure, it can affect you for several hours. Do not drive or operate machinery until your  health care provider says that it is safe. Return to your normal activities as told by your health care provider. Ask your health care provider what activities are safe for you. Follow instructions from your health care provider about when it is safe for you to engage in sexual activity. General instructions Plan to have a responsible adult care for you for the time you are told after you leave the hospital or clinic. Do not take baths, swim, or use a hot tub until your health care provider approves. Ask your health care provider if you may take showers. You may only be allowed to take sponge baths. Keep all follow-up visits. Contact a health care provider if: You have a fever or chills. You have more blood in your urine. You have blood in your urine for more than 2-3 days after the procedure. You have trouble passing urine or having a bowel movement. You have pain or burning when urinating. You have nausea or you vomit. Get help right away if: You have severe pain that does not get better with medicine. Your urine is bright red. You cannot urinate. You have rectal bleeding that gets worse. You have shortness of breath. Summary After the procedure, you may have blood in your urine and light bleeding from the rectum. Return to your normal activities as told by your health care provider. Ask your health care provider what activities are safe for you. Take over-the-counter and prescription medicines only as told by your health care provider. Contact your health care provider right away if  your urine is bright red or you cannot pass urine. This information is not intended to replace advice given to you by your health care provider. Make sure you discuss any questions you have with your health care provider. Document Revised: 08/29/2020 Document Reviewed: 08/29/2020 Elsevier Patient Education  [employer].     No acetaminophen/Tylenol until after 6:30pm today if needed for  pain.     Post Anesthesia Home Care Instructions  Activity: Get plenty of rest for the remainder of the day. A responsible individual must stay with you for 24 hours following the procedure.  For the next 24 hours, DO NOT: -Drive a car -Paediatric nurse -Drink alcoholic beverages -Take any medication unless instructed by your physician -Make any legal decisions or sign important papers.  Meals: Start with liquid foods such as gelatin or soup. Progress to regular foods as tolerated. Avoid greasy, spicy, heavy foods. If nausea and/or vomiting occur, drink only clear liquids until the nausea and/or vomiting subsides. Call your physician if vomiting continues.  Special Instructions/Symptoms: Your throat may feel dry or sore from the anesthesia or the breathing tube placed in your throat during surgery. If this causes discomfort, gargle with warm salt water. The discomfort should disappear within 24 hours.

## 2022-04-22 NOTE — Interval H&P Note (Signed)
History and Physical Interval Note:  04/22/2022 12:49 PM  Harold [name]  has presented today for surgery, with the diagnosis of PROSTATE CANCER.  The various methods of treatment have been discussed with the patient and family. After consideration of risks, benefits and other options for treatment, the patient has consented to  Procedure(s) with comments: GOLD SEED IMPLANT (N/A) - 30 MINS SPACE OAR INSTILLATION (N/A) as a surgical intervention.  The patient's history has been reviewed, patient examined, no change in status, stable for surgery.  I have reviewed the patient's chart and labs.  Questions were answered to the patient's satisfaction.     Harold [name] D Lakeisha Waldrop

## 2022-04-22 NOTE — H&P (Signed)
CC/HPI: cc: elevated PSA   01/15/22: 88 year old Harold [name] referred for an elevated PSA of 19.36. Patient is here today with his daughter. He is experienced significant weight loss over the last several months. He is unsure exactly how much but weighs approximately 120 pounds now. He denies a family history of prostate cancer. He does not have significant LUTS but does report stopping and starting and difficulty postponing urination. No gross hematuria. He recently had a toe amputation on his left foot for osteomyelitis.   02/20/22: Here for TRUS prostate bx. PSA 19.36.   02/28/2022: 88 year old Harold [name] with an elevated PSA of 19.36 who underwent TRUS prostate biopsy last week here for follow-up. He is here today with his daughter and other phone numbers. Pathology showed Gleason 4+3=7 in 3/Harold cores and Gleason 3+4=7 in 1/Harold cores. Patient has had significant weight loss over the last few months.     ALLERGIES: No Known Allergies    MEDICATIONS: Amlodipine Besylate 5 mg tablet  Dorzolamide-Timolol  Gabapentin 100 mg capsule  Latanoprost  Lisinopril-Hydrochlorothiazide 20 mg-25 mg tablet  Vitamin D3     GU PSH: Prostate Needle Biopsy - 02/20/2022       PSH Notes: Left grant toe amputation. 2023   NON-GU PSH: Artery Bypass Graft Heart Surgery (Unspecified) Remove Gallbladder Surgical Pathology, Gross And Microscopic Examination For Prostate Needle - 02/20/2022     GU PMH: Elevated PSA - 02/20/2022, - 01/15/2022      PMH Notes: Diabetes.   NON-GU PMH: Arthritis GERD Glaucoma Heart disease, unspecified Hypercholesterolemia Hypertension Myocardial Infarction    FAMILY HISTORY: 1 son - Runs in Family 3 daughters - Runs in Family   SOCIAL HISTORY: Marital Status: Single Preferred Language: English; Ethnicity: Not Hispanic Or Latino; Race: Black or African American Current Smoking Status: Patient does not smoke anymore.   Tobacco Use Assessment Completed: Used Tobacco in last 30 days? Has  never drank.  Drinks 1 caffeinated drink per day.    REVIEW OF SYSTEMS:    GU Review Male:   Patient denies frequent urination, hard to postpone urination, burning/ pain with urination, get up at night to urinate, leakage of urine, stream starts and stops, trouble starting your stream, have to strain to urinate , erection problems, and penile pain.  Gastrointestinal (Upper):   Patient denies nausea, vomiting, and indigestion/ heartburn.  Gastrointestinal (Lower):   Patient denies diarrhea and constipation.  Constitutional:   Patient denies fever, night sweats, weight loss, and fatigue.  Skin:   Patient denies skin rash/ lesion and itching.  Eyes:   Patient denies blurred vision and double vision.  Ears/ Nose/ Throat:   Patient denies sore throat and sinus problems.  Hematologic/Lymphatic:   Patient denies swollen glands and easy bruising.  Cardiovascular:   Patient denies chest pains and leg swelling.  Respiratory:   Patient denies cough and shortness of breath.  Endocrine:   Patient denies excessive thirst.  Musculoskeletal:   Patient denies back pain and joint pain.  Neurological:   Patient denies headaches and dizziness.  Psychologic:   Patient denies depression and anxiety.   VITAL SIGNS: None   MULTI-SYSTEM PHYSICAL EXAMINATION:    Constitutional: Well-nourished. No physical deformities. Normally developed. Good grooming.  Neck: Neck symmetrical, not swollen. Normal tracheal position.  Respiratory: No labored breathing, no use of accessory muscles.   Skin: No paleness, no jaundice, no cyanosis. No lesion, no ulcer, no rash.  Neurologic / Psychiatric: Oriented to time, oriented to place, oriented to person. No depression,  no anxiety, no agitation.  Eyes: Normal conjunctivae. Normal eyelids.  Ears, Nose, Mouth, and Throat: Left ear no scars, no lesions, no masses. Right ear no scars, no lesions, no masses. Nose no scars, no lesions, no masses. Normal hearing. Normal lips.   Musculoskeletal: Normal gait and station of head and neck.     Complexity of Data:  Records Review:   Previous Patient Records, POC Tool  Urine Test Review:   Urinalysis  Notes:                     01/06/2022: BUN 18, creatinine 1.07   PROCEDURES:          Urinalysis w/Scope Dipstick Dipstick Cont'd Micro  Color: Yellow Bilirubin: Neg mg/dL WBC/hpf: NS (Not Seen)  Appearance: Clear Ketones: Neg mg/dL RBC/hpf: 0 - 2/hpf  Specific Gravity: 1.015 Blood: 1+ ery/uL Bacteria: NS (Not Seen)  pH: 7.0 Protein: Neg mg/dL Cystals: NS (Not Seen)  Glucose: Neg mg/dL Urobilinogen: 0.2 mg/dL Casts: NS (Not Seen)    Nitrites: Neg Trichomonas: Not Present    Leukocyte Esterase: Neg leu/uL Mucous: Not Present      Epithelial Cells: NS (Not Seen)      Yeast: NS (Not Seen)      Sperm: Not Present    ASSESSMENT:      ICD-10 Details  1 GU:   Prostate Cancer - C61 Undiagnosed New Problem   PLAN:           Orders X-Rays: Bone Scan - prostate cancer staging    C.T. Abdomen/Pelvis With I.V. Contrast - prostate cancer staging          Schedule         Document Letter(s):  Created for Patient: Clinical Summary         Notes:   1. Prostate cancer:  -Unfavorable, intermediate risk prostate cancer; grade group 3; however patient PSA bordering on high risk category and significant weight loss so we discussed proceeding with staging imaging with bone scan and CT scan  -Referral to radiation oncology for possible intervention  -I discussed with patient and daughter that I am unsure if the weight loss is from the prostate cancer however I think staging would help [place] determine a plan  -Patient was given copy of radiology report and we reviewed staging, Gleason score, grade group and treatment options

## 2022-04-22 NOTE — Anesthesia Procedure Notes (Signed)
Procedure Name: LMA Insertion Date/Time: 04/22/2022 2:40 PM  Performed by: Mechele Claude, CRNAPre-anesthesia Checklist: Patient identified, Emergency Drugs available, Suction available and Patient being monitored Patient Re-evaluated:Patient Re-evaluated prior to induction Oxygen Delivery Method: Circle system utilized Preoxygenation: Pre-oxygenation with 100% oxygen Induction Type: IV induction Ventilation: Mask ventilation without difficulty LMA: LMA inserted LMA Size: 4.0 Number of attempts: 1 Airway Equipment and Method: Bite block Placement Confirmation: positive ETCO2 Tube secured with: Tape Dental Injury: Teeth and Oropharynx as per pre-operative assessment

## 2022-04-23 ENCOUNTER — Encounter (HOSPITAL_BASED_OUTPATIENT_CLINIC_OR_DEPARTMENT_OTHER): Payer: Self-pay | Admitting: Urology

## 2022-04-23 NOTE — Anesthesia Postprocedure Evaluation (Signed)
Anesthesia Post Note  Patient: Harold [name]  Procedure(s) Performed: GOLD SEED IMPLANT (Prostate) SPACE OAR INSTILLATION (Prostate)     Patient location during evaluation: PACU Anesthesia Type: General Level of consciousness: awake and alert Pain management: pain level controlled Vital Signs Assessment: post-procedure vital signs reviewed and stable Respiratory status: spontaneous breathing, nonlabored ventilation and respiratory function stable Cardiovascular status: blood pressure returned to baseline Postop Assessment: no apparent nausea or vomiting Anesthetic complications: no   No notable events documented.  Last Vitals:  Vitals:   04/22/22 1545 04/22/22 1615  BP: (!) 167/80 (!) 195/71  Pulse: (!) 47 (!) 53  Resp: 15 16  Temp: (!) 36.4 C   SpO2: 99% 100%    Last Pain:  Vitals:   04/22/22 1615  TempSrc:   PainSc: 0-No pain                 Marthenia Rolling

## 2022-04-24 ENCOUNTER — Other Ambulatory Visit: Payer: Self-pay

## 2022-04-24 ENCOUNTER — Ambulatory Visit
Admission: RE | Admit: 2022-04-24 | Discharge: 2022-04-24 | Disposition: A | Discharge status: Home / Self Care | Payer: Medicare Other | Source: Ambulatory Visit | Attending: Radiation Oncology | Admitting: Radiation Oncology

## 2022-04-24 DIAGNOSIS — C61 Malignant neoplasm of prostate: Secondary | ICD-10-CM | POA: Diagnosis present

## 2022-04-24 DIAGNOSIS — Z510 Encounter for antineoplastic radiation therapy: Secondary | ICD-10-CM | POA: Diagnosis not present

## 2022-04-24 DIAGNOSIS — Z191 Hormone sensitive malignancy status: Secondary | ICD-10-CM | POA: Diagnosis not present

## 2022-04-24 NOTE — Progress Notes (Signed)
  Radiation Oncology         [phone] ________________________________  Name: Harold [name] MRN: [id number]  Date: 04/24/2022  DOB: 1932-02-14  SIMULATION AND TREATMENT PLANNING NOTE    ICD-10-CM   1. Malignant neoplasm of prostate ([place])  C61       DIAGNOSIS:   88 y.o. gentleman with Stage T1c adenocarcinoma of the prostate with Gleason score of 4+3, and PSA of 19.36.  NARRATIVE:  The patient was brought to the [facility].  Identity was confirmed.  All relevant records and images related to the planned course of therapy were reviewed.  The patient freely provided informed written consent to proceed with treatment after reviewing the details related to the planned course of therapy. The consent form was witnessed and verified by the simulation staff.  Then, the patient was set-up in a stable reproducible supine position for radiation therapy.  A vacuum lock pillow device was custom fabricated to position his legs in a reproducible immobilized position.  Then, supervised the performance of a urethrogram under sterile conditions to identify the prostatic apex.  CT images were obtained.  Surface markings were placed.  The CT images were loaded into the planning software.  Then the prostate target and avoidance structures including the rectum, bladder, bowel and hips were contoured.  Treatment planning then occurred.  The radiation prescription was entered and confirmed.  A total of one complex treatment devices was fabricated. I have requested : Intensity Modulated Radiotherapy (IMRT) is medically necessary for this case for the following reason:  Rectal sparing.  I have requested daily cone beam CT volumetric image gudiance to track gold fiducial posiitoning along with bladder and rectal filling, this is medically necessary to assure accurate positioning of high dose radiation.  PLAN:  The patient will receive 70 Gy in 28  fractions.  ________________________________  Sheral Apley Tammi Klippel, M.D.

## 2022-04-29 ENCOUNTER — Ambulatory Visit (HOSPITAL_COMMUNITY): Payer: Medicare Other

## 2022-04-29 ENCOUNTER — Ambulatory Visit: Payer: Medicare Other | Admitting: Vascular Surgery

## 2022-04-29 DIAGNOSIS — Z191 Hormone sensitive malignancy status: Secondary | ICD-10-CM | POA: Diagnosis not present

## 2022-04-29 DIAGNOSIS — Z510 Encounter for antineoplastic radiation therapy: Secondary | ICD-10-CM | POA: Diagnosis not present

## 2022-05-02 ENCOUNTER — Telehealth: Payer: Self-pay | Admitting: *Deleted

## 2022-05-02 NOTE — Telephone Encounter (Signed)
RETURNED PATIENT'S DAUGHTER'S  PHONE CALL, SPOKE WITH PATIENT'S DAUGHTER 

## 2022-05-05 ENCOUNTER — Other Ambulatory Visit: Payer: Self-pay

## 2022-05-05 ENCOUNTER — Ambulatory Visit
Admission: RE | Admit: 2022-05-05 | Discharge: 2022-05-05 | Disposition: A | Discharge status: Home / Self Care | Payer: Medicare Other | Source: Ambulatory Visit | Attending: Radiation Oncology | Admitting: Radiation Oncology

## 2022-05-05 DIAGNOSIS — Z510 Encounter for antineoplastic radiation therapy: Secondary | ICD-10-CM | POA: Diagnosis not present

## 2022-05-05 DIAGNOSIS — Z191 Hormone sensitive malignancy status: Secondary | ICD-10-CM | POA: Diagnosis not present

## 2022-05-05 LAB — RAD ONC ARIA SESSION SUMMARY
Course Elapsed Days: 0
Plan Fractions Treated to Date: 1
Plan Prescribed Dose Per Fraction: 2.5 Gy
Plan Total Fractions Prescribed: 28
Plan Total Prescribed Dose: 70 Gy
Reference Point Dosage Given to Date: 2.5 Gy
Reference Point Session Dosage Given: 2.5 Gy
Session Number: 1

## 2022-05-06 ENCOUNTER — Ambulatory Visit
Admission: RE | Admit: 2022-05-06 | Discharge: 2022-05-06 | Disposition: A | Discharge status: Home / Self Care | Payer: Medicare Other | Source: Ambulatory Visit | Attending: Radiation Oncology | Admitting: Radiation Oncology

## 2022-05-06 ENCOUNTER — Other Ambulatory Visit: Payer: Self-pay

## 2022-05-06 DIAGNOSIS — Z510 Encounter for antineoplastic radiation therapy: Secondary | ICD-10-CM | POA: Diagnosis not present

## 2022-05-06 DIAGNOSIS — Z191 Hormone sensitive malignancy status: Secondary | ICD-10-CM | POA: Diagnosis not present

## 2022-05-06 LAB — RAD ONC ARIA SESSION SUMMARY
Course Elapsed Days: 1
Plan Fractions Treated to Date: 2
Plan Prescribed Dose Per Fraction: 2.5 Gy
Plan Total Fractions Prescribed: 28
Plan Total Prescribed Dose: 70 Gy
Reference Point Dosage Given to Date: 5 Gy
Reference Point Session Dosage Given: 2.5 Gy
Session Number: 2

## 2022-05-07 ENCOUNTER — Ambulatory Visit
Admission: RE | Admit: 2022-05-07 | Discharge: 2022-05-07 | Disposition: A | Discharge status: Home / Self Care | Payer: Medicare Other | Source: Ambulatory Visit | Attending: Radiation Oncology | Admitting: Radiation Oncology

## 2022-05-07 ENCOUNTER — Other Ambulatory Visit: Payer: Self-pay

## 2022-05-07 ENCOUNTER — Ambulatory Visit: Payer: Medicare Other

## 2022-05-07 DIAGNOSIS — Z191 Hormone sensitive malignancy status: Secondary | ICD-10-CM | POA: Diagnosis not present

## 2022-05-07 DIAGNOSIS — Z510 Encounter for antineoplastic radiation therapy: Secondary | ICD-10-CM | POA: Diagnosis not present

## 2022-05-07 LAB — RAD ONC ARIA SESSION SUMMARY
Course Elapsed Days: 2
Plan Fractions Treated to Date: 3
Plan Prescribed Dose Per Fraction: 2.5 Gy
Plan Total Fractions Prescribed: 28
Plan Total Prescribed Dose: 70 Gy
Reference Point Dosage Given to Date: 7.5 Gy
Reference Point Session Dosage Given: 2.5 Gy
Session Number: 3

## 2022-05-12 ENCOUNTER — Other Ambulatory Visit: Payer: Self-pay

## 2022-05-12 ENCOUNTER — Ambulatory Visit
Admission: RE | Admit: 2022-05-12 | Discharge: 2022-05-12 | Disposition: A | Discharge status: Home / Self Care | Payer: Medicare Other | Source: Ambulatory Visit | Attending: Radiation Oncology | Admitting: Radiation Oncology

## 2022-05-12 DIAGNOSIS — C61 Malignant neoplasm of prostate: Secondary | ICD-10-CM

## 2022-05-12 DIAGNOSIS — Z191 Hormone sensitive malignancy status: Secondary | ICD-10-CM | POA: Diagnosis not present

## 2022-05-12 DIAGNOSIS — Z510 Encounter for antineoplastic radiation therapy: Secondary | ICD-10-CM | POA: Diagnosis not present

## 2022-05-12 LAB — RAD ONC ARIA SESSION SUMMARY
Course Elapsed Days: 7
Plan Fractions Treated to Date: 4
Plan Prescribed Dose Per Fraction: 2.5 Gy
Plan Total Fractions Prescribed: 28
Plan Total Prescribed Dose: 70 Gy
Reference Point Dosage Given to Date: 10 Gy
Reference Point Session Dosage Given: 2.5 Gy
Session Number: 4

## 2022-05-12 MED ORDER — SONAFINE EX EMUL
1.0000 | Freq: Two times a day (BID) | CUTANEOUS | Status: DC
  Administered 2022-05-12: 1 via TOPICAL

## 2022-05-12 NOTE — Progress Notes (Signed)
Pt here for patient teaching. Pt given Radiation and You booklet, skin care instructions, and Sonafine. Reviewed areas of pertinence such as diarrhea, fatigue, hair loss, nausea and vomiting, sexual and fertility changes, skin changes, urinary and bladder changes, and taste changes. Pt able to give teach back of to pat skin, use unscented/gentle soap, use baby wipes, have Imodium on hand, drink plenty of water, and sitz bath, apply Sonafine bid, avoid applying anything to skin within 4 hours of treatment, and to use an electric razor if they must shave. Pt verbalizes understanding of information given and will contact nursing with any questions or concerns.    [email or web address]

## 2022-05-13 ENCOUNTER — Other Ambulatory Visit: Payer: Self-pay

## 2022-05-13 ENCOUNTER — Ambulatory Visit
Admission: RE | Admit: 2022-05-13 | Discharge: 2022-05-13 | Disposition: A | Discharge status: Home / Self Care | Payer: Medicare Other | Source: Ambulatory Visit | Attending: Radiation Oncology | Admitting: Radiation Oncology

## 2022-05-13 DIAGNOSIS — Z191 Hormone sensitive malignancy status: Secondary | ICD-10-CM | POA: Diagnosis not present

## 2022-05-13 DIAGNOSIS — Z510 Encounter for antineoplastic radiation therapy: Secondary | ICD-10-CM | POA: Diagnosis not present

## 2022-05-13 LAB — RAD ONC ARIA SESSION SUMMARY
Course Elapsed Days: 8
Plan Fractions Treated to Date: 5
Plan Prescribed Dose Per Fraction: 2.5 Gy
Plan Total Fractions Prescribed: 28
Plan Total Prescribed Dose: 70 Gy
Reference Point Dosage Given to Date: 12.5 Gy
Reference Point Session Dosage Given: 2.5 Gy
Session Number: 5

## 2022-05-14 ENCOUNTER — Other Ambulatory Visit: Payer: Self-pay

## 2022-05-14 ENCOUNTER — Ambulatory Visit
Admission: RE | Admit: 2022-05-14 | Discharge: 2022-05-14 | Disposition: A | Discharge status: Home / Self Care | Payer: Medicare Other | Source: Ambulatory Visit | Attending: Radiation Oncology | Admitting: Radiation Oncology

## 2022-05-14 DIAGNOSIS — Z510 Encounter for antineoplastic radiation therapy: Secondary | ICD-10-CM | POA: Diagnosis not present

## 2022-05-14 DIAGNOSIS — Z191 Hormone sensitive malignancy status: Secondary | ICD-10-CM | POA: Diagnosis not present

## 2022-05-14 LAB — RAD ONC ARIA SESSION SUMMARY
Course Elapsed Days: 9
Plan Fractions Treated to Date: 6
Plan Prescribed Dose Per Fraction: 2.5 Gy
Plan Total Fractions Prescribed: 28
Plan Total Prescribed Dose: 70 Gy
Reference Point Dosage Given to Date: 15 Gy
Reference Point Session Dosage Given: 2.5 Gy
Session Number: 6

## 2022-05-15 ENCOUNTER — Ambulatory Visit
Admission: RE | Admit: 2022-05-15 | Discharge: 2022-05-15 | Disposition: A | Discharge status: Home / Self Care | Payer: Medicare Other | Source: Ambulatory Visit | Attending: Radiation Oncology | Admitting: Radiation Oncology

## 2022-05-15 ENCOUNTER — Other Ambulatory Visit: Payer: Self-pay

## 2022-05-15 DIAGNOSIS — Z510 Encounter for antineoplastic radiation therapy: Secondary | ICD-10-CM | POA: Diagnosis not present

## 2022-05-15 DIAGNOSIS — Z191 Hormone sensitive malignancy status: Secondary | ICD-10-CM | POA: Diagnosis not present

## 2022-05-15 LAB — RAD ONC ARIA SESSION SUMMARY
Course Elapsed Days: 10
Plan Fractions Treated to Date: 7
Plan Prescribed Dose Per Fraction: 2.5 Gy
Plan Total Fractions Prescribed: 28
Plan Total Prescribed Dose: 70 Gy
Reference Point Dosage Given to Date: 17.5 Gy
Reference Point Session Dosage Given: 2.5 Gy
Session Number: 7

## 2022-05-16 ENCOUNTER — Ambulatory Visit
Admission: RE | Admit: 2022-05-16 | Discharge: 2022-05-16 | Disposition: A | Discharge status: Home / Self Care | Payer: Medicare Other | Source: Ambulatory Visit | Attending: Radiation Oncology | Admitting: Radiation Oncology

## 2022-05-16 ENCOUNTER — Other Ambulatory Visit: Payer: Self-pay

## 2022-05-16 DIAGNOSIS — C61 Malignant neoplasm of prostate: Secondary | ICD-10-CM | POA: Insufficient documentation

## 2022-05-16 DIAGNOSIS — Z510 Encounter for antineoplastic radiation therapy: Secondary | ICD-10-CM | POA: Diagnosis not present

## 2022-05-16 DIAGNOSIS — Z191 Hormone sensitive malignancy status: Secondary | ICD-10-CM | POA: Diagnosis not present

## 2022-05-16 LAB — RAD ONC ARIA SESSION SUMMARY
Course Elapsed Days: 11
Plan Fractions Treated to Date: 8
Plan Prescribed Dose Per Fraction: 2.5 Gy
Plan Total Fractions Prescribed: 28
Plan Total Prescribed Dose: 70 Gy
Reference Point Dosage Given to Date: 20 Gy
Reference Point Session Dosage Given: 2.5 Gy
Session Number: 8

## 2022-05-19 ENCOUNTER — Ambulatory Visit
Admission: RE | Admit: 2022-05-19 | Discharge: 2022-05-19 | Disposition: A | Discharge status: Home / Self Care | Payer: Medicare Other | Source: Ambulatory Visit | Attending: Radiation Oncology | Admitting: Radiation Oncology

## 2022-05-19 ENCOUNTER — Other Ambulatory Visit: Payer: Self-pay

## 2022-05-19 DIAGNOSIS — Z510 Encounter for antineoplastic radiation therapy: Secondary | ICD-10-CM | POA: Diagnosis not present

## 2022-05-19 DIAGNOSIS — Z191 Hormone sensitive malignancy status: Secondary | ICD-10-CM | POA: Diagnosis not present

## 2022-05-19 LAB — RAD ONC ARIA SESSION SUMMARY
Course Elapsed Days: 14
Plan Fractions Treated to Date: 9
Plan Prescribed Dose Per Fraction: 2.5 Gy
Plan Total Fractions Prescribed: 28
Plan Total Prescribed Dose: 70 Gy
Reference Point Dosage Given to Date: 22.5 Gy
Reference Point Session Dosage Given: 2.5 Gy
Session Number: 9

## 2022-05-20 ENCOUNTER — Ambulatory Visit
Admission: RE | Admit: 2022-05-20 | Discharge: 2022-05-20 | Disposition: A | Discharge status: Home / Self Care | Payer: Medicare Other | Source: Ambulatory Visit | Attending: Radiation Oncology | Admitting: Radiation Oncology

## 2022-05-20 ENCOUNTER — Other Ambulatory Visit: Payer: Self-pay

## 2022-05-20 DIAGNOSIS — Z191 Hormone sensitive malignancy status: Secondary | ICD-10-CM | POA: Diagnosis not present

## 2022-05-20 DIAGNOSIS — Z510 Encounter for antineoplastic radiation therapy: Secondary | ICD-10-CM | POA: Diagnosis not present

## 2022-05-20 LAB — RAD ONC ARIA SESSION SUMMARY
Course Elapsed Days: 15
Plan Fractions Treated to Date: 10
Plan Prescribed Dose Per Fraction: 2.5 Gy
Plan Total Fractions Prescribed: 28
Plan Total Prescribed Dose: 70 Gy
Reference Point Dosage Given to Date: 25 Gy
Reference Point Session Dosage Given: 2.5 Gy
Session Number: 10

## 2022-05-21 ENCOUNTER — Other Ambulatory Visit: Payer: Self-pay

## 2022-05-21 ENCOUNTER — Ambulatory Visit
Admission: RE | Admit: 2022-05-21 | Discharge: 2022-05-21 | Disposition: A | Discharge status: Home / Self Care | Payer: Medicare Other | Source: Ambulatory Visit | Attending: Radiation Oncology | Admitting: Radiation Oncology

## 2022-05-21 DIAGNOSIS — Z510 Encounter for antineoplastic radiation therapy: Secondary | ICD-10-CM | POA: Diagnosis not present

## 2022-05-21 DIAGNOSIS — Z191 Hormone sensitive malignancy status: Secondary | ICD-10-CM | POA: Diagnosis not present

## 2022-05-21 LAB — RAD ONC ARIA SESSION SUMMARY
Course Elapsed Days: 16
Plan Fractions Treated to Date: 11
Plan Prescribed Dose Per Fraction: 2.5 Gy
Plan Total Fractions Prescribed: 28
Plan Total Prescribed Dose: 70 Gy
Reference Point Dosage Given to Date: 27.5 Gy
Reference Point Session Dosage Given: 2.5 Gy
Session Number: 11

## 2022-05-22 ENCOUNTER — Ambulatory Visit
Admission: RE | Admit: 2022-05-22 | Discharge: 2022-05-22 | Disposition: A | Discharge status: Home / Self Care | Payer: Medicare Other | Source: Ambulatory Visit | Attending: Radiation Oncology | Admitting: Radiation Oncology

## 2022-05-22 ENCOUNTER — Other Ambulatory Visit: Payer: Self-pay

## 2022-05-22 DIAGNOSIS — E1165 Type 2 diabetes mellitus with hyperglycemia: Secondary | ICD-10-CM | POA: Diagnosis not present

## 2022-05-22 DIAGNOSIS — I2510 Atherosclerotic heart disease of native coronary artery without angina pectoris: Secondary | ICD-10-CM | POA: Diagnosis not present

## 2022-05-22 DIAGNOSIS — Z510 Encounter for antineoplastic radiation therapy: Secondary | ICD-10-CM | POA: Diagnosis not present

## 2022-05-22 DIAGNOSIS — Z0000 Encounter for general adult medical examination without abnormal findings: Secondary | ICD-10-CM | POA: Diagnosis not present

## 2022-05-22 DIAGNOSIS — D649 Anemia, unspecified: Secondary | ICD-10-CM | POA: Diagnosis not present

## 2022-05-22 DIAGNOSIS — I10 Essential (primary) hypertension: Secondary | ICD-10-CM | POA: Diagnosis not present

## 2022-05-22 DIAGNOSIS — Z191 Hormone sensitive malignancy status: Secondary | ICD-10-CM | POA: Diagnosis not present

## 2022-05-22 DIAGNOSIS — J449 Chronic obstructive pulmonary disease, unspecified: Secondary | ICD-10-CM | POA: Diagnosis not present

## 2022-05-22 DIAGNOSIS — M158 Other polyosteoarthritis: Secondary | ICD-10-CM | POA: Diagnosis not present

## 2022-05-22 DIAGNOSIS — E7800 Pure hypercholesterolemia, unspecified: Secondary | ICD-10-CM | POA: Diagnosis not present

## 2022-05-22 DIAGNOSIS — E559 Vitamin D deficiency, unspecified: Secondary | ICD-10-CM | POA: Diagnosis not present

## 2022-05-22 DIAGNOSIS — I509 Heart failure, unspecified: Secondary | ICD-10-CM | POA: Diagnosis not present

## 2022-05-22 LAB — RAD ONC ARIA SESSION SUMMARY
Course Elapsed Days: 17
Plan Fractions Treated to Date: 12
Plan Prescribed Dose Per Fraction: 2.5 Gy
Plan Total Fractions Prescribed: 28
Plan Total Prescribed Dose: 70 Gy
Reference Point Dosage Given to Date: 30 Gy
Reference Point Session Dosage Given: 2.5 Gy
Session Number: 12

## 2022-05-23 ENCOUNTER — Ambulatory Visit
Admission: RE | Admit: 2022-05-23 | Discharge: 2022-05-23 | Disposition: A | Discharge status: Home / Self Care | Payer: Medicare Other | Source: Ambulatory Visit | Attending: Radiation Oncology | Admitting: Radiation Oncology

## 2022-05-23 ENCOUNTER — Other Ambulatory Visit: Payer: Self-pay

## 2022-05-23 DIAGNOSIS — Z191 Hormone sensitive malignancy status: Secondary | ICD-10-CM | POA: Diagnosis not present

## 2022-05-23 DIAGNOSIS — Z510 Encounter for antineoplastic radiation therapy: Secondary | ICD-10-CM | POA: Diagnosis not present

## 2022-05-23 LAB — RAD ONC ARIA SESSION SUMMARY
Course Elapsed Days: 18
Plan Fractions Treated to Date: 13
Plan Prescribed Dose Per Fraction: 2.5 Gy
Plan Total Fractions Prescribed: 28
Plan Total Prescribed Dose: 70 Gy
Reference Point Dosage Given to Date: 32.5 Gy
Reference Point Session Dosage Given: 2.5 Gy
Session Number: 13

## 2022-05-26 ENCOUNTER — Ambulatory Visit
Admission: RE | Admit: 2022-05-26 | Discharge: 2022-05-26 | Disposition: A | Discharge status: Home / Self Care | Payer: Medicare Other | Source: Ambulatory Visit | Attending: Radiation Oncology | Admitting: Radiation Oncology

## 2022-05-26 ENCOUNTER — Other Ambulatory Visit: Payer: Self-pay

## 2022-05-26 DIAGNOSIS — Z191 Hormone sensitive malignancy status: Secondary | ICD-10-CM | POA: Diagnosis not present

## 2022-05-26 DIAGNOSIS — Z510 Encounter for antineoplastic radiation therapy: Secondary | ICD-10-CM | POA: Diagnosis not present

## 2022-05-26 LAB — RAD ONC ARIA SESSION SUMMARY
Course Elapsed Days: 21
Plan Fractions Treated to Date: 14
Plan Prescribed Dose Per Fraction: 2.5 Gy
Plan Total Fractions Prescribed: 28
Plan Total Prescribed Dose: 70 Gy
Reference Point Dosage Given to Date: 35 Gy
Reference Point Session Dosage Given: 2.5 Gy
Session Number: 14

## 2022-05-27 ENCOUNTER — Ambulatory Visit
Admission: RE | Admit: 2022-05-27 | Discharge: 2022-05-27 | Disposition: A | Discharge status: Home / Self Care | Payer: Medicare Other | Source: Ambulatory Visit | Attending: Radiation Oncology | Admitting: Radiation Oncology

## 2022-05-27 ENCOUNTER — Other Ambulatory Visit: Payer: Self-pay

## 2022-05-27 DIAGNOSIS — Z510 Encounter for antineoplastic radiation therapy: Secondary | ICD-10-CM | POA: Diagnosis not present

## 2022-05-27 DIAGNOSIS — Z191 Hormone sensitive malignancy status: Secondary | ICD-10-CM | POA: Diagnosis not present

## 2022-05-27 LAB — RAD ONC ARIA SESSION SUMMARY
Course Elapsed Days: 22
Plan Fractions Treated to Date: 15
Plan Prescribed Dose Per Fraction: 2.5 Gy
Plan Total Fractions Prescribed: 28
Plan Total Prescribed Dose: 70 Gy
Reference Point Dosage Given to Date: 37.5 Gy
Reference Point Session Dosage Given: 2.5 Gy
Session Number: 15

## 2022-05-28 ENCOUNTER — Other Ambulatory Visit: Payer: Self-pay

## 2022-05-28 ENCOUNTER — Ambulatory Visit
Admission: RE | Admit: 2022-05-28 | Discharge: 2022-05-28 | Disposition: A | Discharge status: Home / Self Care | Payer: Medicare Other | Source: Ambulatory Visit | Attending: Radiation Oncology | Admitting: Radiation Oncology

## 2022-05-28 DIAGNOSIS — Z191 Hormone sensitive malignancy status: Secondary | ICD-10-CM | POA: Diagnosis not present

## 2022-05-28 DIAGNOSIS — Z510 Encounter for antineoplastic radiation therapy: Secondary | ICD-10-CM | POA: Diagnosis not present

## 2022-05-28 LAB — RAD ONC ARIA SESSION SUMMARY
Course Elapsed Days: 23
Plan Fractions Treated to Date: 16
Plan Prescribed Dose Per Fraction: 2.5 Gy
Plan Total Fractions Prescribed: 28
Plan Total Prescribed Dose: 70 Gy
Reference Point Dosage Given to Date: 40 Gy
Reference Point Session Dosage Given: 2.5 Gy
Session Number: 16

## 2022-05-29 ENCOUNTER — Other Ambulatory Visit: Payer: Self-pay

## 2022-05-29 ENCOUNTER — Ambulatory Visit
Admission: RE | Admit: 2022-05-29 | Discharge: 2022-05-29 | Disposition: A | Discharge status: Home / Self Care | Payer: Medicare Other | Source: Ambulatory Visit | Attending: Radiation Oncology | Admitting: Radiation Oncology

## 2022-05-29 DIAGNOSIS — Z191 Hormone sensitive malignancy status: Secondary | ICD-10-CM | POA: Diagnosis not present

## 2022-05-29 DIAGNOSIS — Z510 Encounter for antineoplastic radiation therapy: Secondary | ICD-10-CM | POA: Diagnosis not present

## 2022-05-29 LAB — RAD ONC ARIA SESSION SUMMARY
Course Elapsed Days: 24
Plan Fractions Treated to Date: 17
Plan Prescribed Dose Per Fraction: 2.5 Gy
Plan Total Fractions Prescribed: 28
Plan Total Prescribed Dose: 70 Gy
Reference Point Dosage Given to Date: 42.5 Gy
Reference Point Session Dosage Given: 2.5 Gy
Session Number: 17

## 2022-05-30 ENCOUNTER — Other Ambulatory Visit: Payer: Self-pay

## 2022-05-30 ENCOUNTER — Ambulatory Visit
Admission: RE | Admit: 2022-05-30 | Discharge: 2022-05-30 | Disposition: A | Discharge status: Home / Self Care | Payer: Medicare Other | Source: Ambulatory Visit | Attending: Radiation Oncology | Admitting: Radiation Oncology

## 2022-05-30 DIAGNOSIS — Z510 Encounter for antineoplastic radiation therapy: Secondary | ICD-10-CM | POA: Diagnosis not present

## 2022-05-30 DIAGNOSIS — Z191 Hormone sensitive malignancy status: Secondary | ICD-10-CM | POA: Diagnosis not present

## 2022-05-30 LAB — RAD ONC ARIA SESSION SUMMARY
Course Elapsed Days: 25
Plan Fractions Treated to Date: 18
Plan Prescribed Dose Per Fraction: 2.5 Gy
Plan Total Fractions Prescribed: 28
Plan Total Prescribed Dose: 70 Gy
Reference Point Dosage Given to Date: 45 Gy
Reference Point Session Dosage Given: 2.5 Gy
Session Number: 18

## 2022-06-02 ENCOUNTER — Other Ambulatory Visit: Payer: Self-pay

## 2022-06-02 ENCOUNTER — Ambulatory Visit
Admission: RE | Admit: 2022-06-02 | Discharge: 2022-06-02 | Disposition: A | Discharge status: Home / Self Care | Payer: Medicare Other | Source: Ambulatory Visit | Attending: Radiation Oncology | Admitting: Radiation Oncology

## 2022-06-02 DIAGNOSIS — Z510 Encounter for antineoplastic radiation therapy: Secondary | ICD-10-CM | POA: Diagnosis not present

## 2022-06-02 DIAGNOSIS — Z191 Hormone sensitive malignancy status: Secondary | ICD-10-CM | POA: Diagnosis not present

## 2022-06-02 LAB — RAD ONC ARIA SESSION SUMMARY
Course Elapsed Days: 28
Plan Fractions Treated to Date: 19
Plan Prescribed Dose Per Fraction: 2.5 Gy
Plan Total Fractions Prescribed: 28
Plan Total Prescribed Dose: 70 Gy
Reference Point Dosage Given to Date: 47.5 Gy
Reference Point Session Dosage Given: 2.5 Gy
Session Number: 19

## 2022-06-03 ENCOUNTER — Other Ambulatory Visit: Payer: Self-pay

## 2022-06-03 ENCOUNTER — Ambulatory Visit
Admission: RE | Admit: 2022-06-03 | Discharge: 2022-06-03 | Disposition: A | Discharge status: Home / Self Care | Payer: Medicare Other | Source: Ambulatory Visit | Attending: Radiation Oncology | Admitting: Radiation Oncology

## 2022-06-03 DIAGNOSIS — Z191 Hormone sensitive malignancy status: Secondary | ICD-10-CM | POA: Diagnosis not present

## 2022-06-03 DIAGNOSIS — Z510 Encounter for antineoplastic radiation therapy: Secondary | ICD-10-CM | POA: Diagnosis not present

## 2022-06-03 LAB — RAD ONC ARIA SESSION SUMMARY
Course Elapsed Days: 29
Plan Fractions Treated to Date: 20
Plan Prescribed Dose Per Fraction: 2.5 Gy
Plan Total Fractions Prescribed: 28
Plan Total Prescribed Dose: 70 Gy
Reference Point Dosage Given to Date: 50 Gy
Reference Point Session Dosage Given: 2.5 Gy
Session Number: 20

## 2022-06-04 ENCOUNTER — Other Ambulatory Visit: Payer: Self-pay

## 2022-06-04 ENCOUNTER — Ambulatory Visit
Admission: RE | Admit: 2022-06-04 | Discharge: 2022-06-04 | Disposition: A | Discharge status: Home / Self Care | Payer: Medicare Other | Source: Ambulatory Visit | Attending: Radiation Oncology | Admitting: Radiation Oncology

## 2022-06-04 DIAGNOSIS — Z191 Hormone sensitive malignancy status: Secondary | ICD-10-CM | POA: Diagnosis not present

## 2022-06-04 DIAGNOSIS — Z510 Encounter for antineoplastic radiation therapy: Secondary | ICD-10-CM | POA: Diagnosis not present

## 2022-06-04 LAB — RAD ONC ARIA SESSION SUMMARY
Course Elapsed Days: 30
Plan Fractions Treated to Date: 21
Plan Prescribed Dose Per Fraction: 2.5 Gy
Plan Total Fractions Prescribed: 28
Plan Total Prescribed Dose: 70 Gy
Reference Point Dosage Given to Date: 52.5 Gy
Reference Point Session Dosage Given: 2.5 Gy
Session Number: 21

## 2022-06-05 ENCOUNTER — Ambulatory Visit
Admission: RE | Admit: 2022-06-05 | Discharge: 2022-06-05 | Disposition: A | Discharge status: Home / Self Care | Payer: Medicare Other | Source: Ambulatory Visit | Attending: Radiation Oncology | Admitting: Radiation Oncology

## 2022-06-05 ENCOUNTER — Other Ambulatory Visit: Payer: Self-pay

## 2022-06-05 DIAGNOSIS — Z510 Encounter for antineoplastic radiation therapy: Secondary | ICD-10-CM | POA: Diagnosis not present

## 2022-06-05 DIAGNOSIS — Z191 Hormone sensitive malignancy status: Secondary | ICD-10-CM | POA: Diagnosis not present

## 2022-06-05 LAB — RAD ONC ARIA SESSION SUMMARY
Course Elapsed Days: 31
Plan Fractions Treated to Date: 22
Plan Prescribed Dose Per Fraction: 2.5 Gy
Plan Total Fractions Prescribed: 28
Plan Total Prescribed Dose: 70 Gy
Reference Point Dosage Given to Date: 55 Gy
Reference Point Session Dosage Given: 2.5 Gy
Session Number: 22

## 2022-06-06 ENCOUNTER — Ambulatory Visit
Admission: RE | Admit: 2022-06-06 | Discharge: 2022-06-06 | Disposition: A | Discharge status: Home / Self Care | Payer: Medicare Other | Source: Ambulatory Visit | Attending: Radiation Oncology | Admitting: Radiation Oncology

## 2022-06-06 ENCOUNTER — Other Ambulatory Visit: Payer: Self-pay

## 2022-06-06 DIAGNOSIS — Z191 Hormone sensitive malignancy status: Secondary | ICD-10-CM | POA: Diagnosis not present

## 2022-06-06 DIAGNOSIS — Z510 Encounter for antineoplastic radiation therapy: Secondary | ICD-10-CM | POA: Diagnosis not present

## 2022-06-06 LAB — RAD ONC ARIA SESSION SUMMARY
Course Elapsed Days: 32
Plan Fractions Treated to Date: 23
Plan Prescribed Dose Per Fraction: 2.5 Gy
Plan Total Fractions Prescribed: 28
Plan Total Prescribed Dose: 70 Gy
Reference Point Dosage Given to Date: 57.5 Gy
Reference Point Session Dosage Given: 2.5 Gy
Session Number: 23

## 2022-06-10 ENCOUNTER — Ambulatory Visit: Payer: Medicare Other

## 2022-06-11 ENCOUNTER — Ambulatory Visit: Payer: Medicare Other

## 2022-06-11 DIAGNOSIS — R0981 Nasal congestion: Secondary | ICD-10-CM | POA: Diagnosis not present

## 2022-06-11 DIAGNOSIS — R058 Other specified cough: Secondary | ICD-10-CM | POA: Diagnosis not present

## 2022-06-11 DIAGNOSIS — R5383 Other fatigue: Secondary | ICD-10-CM | POA: Diagnosis not present

## 2022-06-11 DIAGNOSIS — R509 Fever, unspecified: Secondary | ICD-10-CM | POA: Diagnosis not present

## 2022-06-12 ENCOUNTER — Ambulatory Visit: Payer: Medicare Other

## 2022-06-13 ENCOUNTER — Ambulatory Visit: Payer: Medicare Other

## 2022-06-17 ENCOUNTER — Other Ambulatory Visit: Payer: Self-pay

## 2022-06-17 ENCOUNTER — Ambulatory Visit: Payer: Medicare Other

## 2022-06-17 ENCOUNTER — Ambulatory Visit
Admission: RE | Admit: 2022-06-17 | Discharge: 2022-06-17 | Disposition: A | Discharge status: Home / Self Care | Payer: Medicare Other | Source: Ambulatory Visit | Attending: Radiation Oncology | Admitting: Radiation Oncology

## 2022-06-17 DIAGNOSIS — Z191 Hormone sensitive malignancy status: Secondary | ICD-10-CM | POA: Diagnosis not present

## 2022-06-17 DIAGNOSIS — C61 Malignant neoplasm of prostate: Secondary | ICD-10-CM | POA: Diagnosis present

## 2022-06-17 DIAGNOSIS — Z510 Encounter for antineoplastic radiation therapy: Secondary | ICD-10-CM | POA: Diagnosis not present

## 2022-06-17 LAB — RAD ONC ARIA SESSION SUMMARY
Course Elapsed Days: 43
Plan Fractions Treated to Date: 24
Plan Prescribed Dose Per Fraction: 2.5 Gy
Plan Total Fractions Prescribed: 28
Plan Total Prescribed Dose: 70 Gy
Reference Point Dosage Given to Date: 60 Gy
Reference Point Session Dosage Given: 2.5 Gy
Session Number: 24

## 2022-06-18 ENCOUNTER — Other Ambulatory Visit: Payer: Self-pay

## 2022-06-18 ENCOUNTER — Ambulatory Visit
Admission: RE | Admit: 2022-06-18 | Discharge: 2022-06-18 | Disposition: A | Discharge status: Home / Self Care | Payer: Medicare Other | Source: Ambulatory Visit | Attending: Radiation Oncology | Admitting: Radiation Oncology

## 2022-06-18 ENCOUNTER — Ambulatory Visit: Payer: Medicare Other

## 2022-06-18 DIAGNOSIS — Z510 Encounter for antineoplastic radiation therapy: Secondary | ICD-10-CM | POA: Diagnosis not present

## 2022-06-18 DIAGNOSIS — Z191 Hormone sensitive malignancy status: Secondary | ICD-10-CM | POA: Diagnosis not present

## 2022-06-18 LAB — RAD ONC ARIA SESSION SUMMARY
Course Elapsed Days: 44
Plan Fractions Treated to Date: 25
Plan Prescribed Dose Per Fraction: 2.5 Gy
Plan Total Fractions Prescribed: 28
Plan Total Prescribed Dose: 70 Gy
Reference Point Dosage Given to Date: 62.5 Gy
Reference Point Session Dosage Given: 2.5 Gy
Session Number: 25

## 2022-06-19 ENCOUNTER — Other Ambulatory Visit: Payer: Self-pay

## 2022-06-19 ENCOUNTER — Ambulatory Visit
Admission: RE | Admit: 2022-06-19 | Discharge: 2022-06-19 | Disposition: A | Discharge status: Home / Self Care | Payer: Medicare Other | Source: Ambulatory Visit | Attending: Radiation Oncology | Admitting: Radiation Oncology

## 2022-06-19 ENCOUNTER — Ambulatory Visit: Payer: Medicare Other

## 2022-06-19 DIAGNOSIS — Z510 Encounter for antineoplastic radiation therapy: Secondary | ICD-10-CM | POA: Diagnosis not present

## 2022-06-19 DIAGNOSIS — Z191 Hormone sensitive malignancy status: Secondary | ICD-10-CM | POA: Diagnosis not present

## 2022-06-19 LAB — RAD ONC ARIA SESSION SUMMARY
Course Elapsed Days: 45
Plan Fractions Treated to Date: 26
Plan Prescribed Dose Per Fraction: 2.5 Gy
Plan Total Fractions Prescribed: 28
Plan Total Prescribed Dose: 70 Gy
Reference Point Dosage Given to Date: 65 Gy
Reference Point Session Dosage Given: 2.5 Gy
Session Number: 26

## 2022-06-20 ENCOUNTER — Other Ambulatory Visit: Payer: Self-pay

## 2022-06-20 ENCOUNTER — Ambulatory Visit
Admission: RE | Admit: 2022-06-20 | Discharge: 2022-06-20 | Disposition: A | Discharge status: Home / Self Care | Payer: Medicare Other | Source: Ambulatory Visit | Attending: Radiation Oncology | Admitting: Radiation Oncology

## 2022-06-20 DIAGNOSIS — Z510 Encounter for antineoplastic radiation therapy: Secondary | ICD-10-CM | POA: Diagnosis not present

## 2022-06-20 DIAGNOSIS — Z191 Hormone sensitive malignancy status: Secondary | ICD-10-CM | POA: Diagnosis not present

## 2022-06-20 LAB — RAD ONC ARIA SESSION SUMMARY
Course Elapsed Days: 46
Plan Fractions Treated to Date: 27
Plan Prescribed Dose Per Fraction: 2.5 Gy
Plan Total Fractions Prescribed: 28
Plan Total Prescribed Dose: 70 Gy
Reference Point Dosage Given to Date: 67.5 Gy
Reference Point Session Dosage Given: 2.5 Gy
Session Number: 27

## 2022-06-23 ENCOUNTER — Encounter: Payer: Self-pay | Admitting: Urology

## 2022-06-23 ENCOUNTER — Ambulatory Visit: Payer: Medicare Other

## 2022-06-23 ENCOUNTER — Ambulatory Visit
Admission: RE | Admit: 2022-06-23 | Discharge: 2022-06-23 | Disposition: A | Discharge status: Home / Self Care | Payer: Medicare Other | Source: Ambulatory Visit | Attending: Radiation Oncology | Admitting: Radiation Oncology

## 2022-06-23 ENCOUNTER — Other Ambulatory Visit: Payer: Self-pay

## 2022-06-23 DIAGNOSIS — Z510 Encounter for antineoplastic radiation therapy: Secondary | ICD-10-CM | POA: Diagnosis not present

## 2022-06-23 DIAGNOSIS — Z191 Hormone sensitive malignancy status: Secondary | ICD-10-CM | POA: Diagnosis not present

## 2022-06-23 LAB — RAD ONC ARIA SESSION SUMMARY
Course Elapsed Days: 49
Plan Fractions Treated to Date: 28
Plan Prescribed Dose Per Fraction: 2.5 Gy
Plan Total Fractions Prescribed: 28
Plan Total Prescribed Dose: 70 Gy
Reference Point Dosage Given to Date: 70 Gy
Reference Point Session Dosage Given: 2.5 Gy
Session Number: 28

## 2022-06-24 ENCOUNTER — Ambulatory Visit: Payer: Medicare Other

## 2022-07-14 DIAGNOSIS — I739 Peripheral vascular disease, unspecified: Secondary | ICD-10-CM | POA: Diagnosis not present

## 2022-07-14 DIAGNOSIS — Z89412 Acquired absence of left great toe: Secondary | ICD-10-CM | POA: Diagnosis not present

## 2022-07-14 DIAGNOSIS — J449 Chronic obstructive pulmonary disease, unspecified: Secondary | ICD-10-CM | POA: Diagnosis not present

## 2022-07-14 DIAGNOSIS — I5032 Chronic diastolic (congestive) heart failure: Secondary | ICD-10-CM | POA: Diagnosis not present

## 2022-07-14 DIAGNOSIS — E1165 Type 2 diabetes mellitus with hyperglycemia: Secondary | ICD-10-CM | POA: Diagnosis not present

## 2022-07-14 DIAGNOSIS — I509 Heart failure, unspecified: Secondary | ICD-10-CM | POA: Diagnosis not present

## 2022-07-14 DIAGNOSIS — E7800 Pure hypercholesterolemia, unspecified: Secondary | ICD-10-CM | POA: Diagnosis not present

## 2022-07-14 DIAGNOSIS — I2510 Atherosclerotic heart disease of native coronary artery without angina pectoris: Secondary | ICD-10-CM | POA: Diagnosis not present

## 2022-07-14 DIAGNOSIS — I10 Essential (primary) hypertension: Secondary | ICD-10-CM | POA: Diagnosis not present

## 2022-07-23 DIAGNOSIS — R3915 Urgency of urination: Secondary | ICD-10-CM | POA: Diagnosis not present

## 2022-07-30 DIAGNOSIS — M71571 Other bursitis, not elsewhere classified, right ankle and foot: Secondary | ICD-10-CM | POA: Diagnosis not present

## 2022-07-30 DIAGNOSIS — M2041 Other hammer toe(s) (acquired), right foot: Secondary | ICD-10-CM | POA: Diagnosis not present

## 2022-07-30 DIAGNOSIS — M79671 Pain in right foot: Secondary | ICD-10-CM | POA: Diagnosis not present

## 2022-08-01 ENCOUNTER — Other Ambulatory Visit: Payer: Self-pay | Admitting: Urology

## 2022-08-01 DIAGNOSIS — C61 Malignant neoplasm of prostate: Secondary | ICD-10-CM

## 2022-08-01 NOTE — Progress Notes (Signed)
                                                                                                                                                             Patient Name: Harold [name] MRN: [id number] DOB: [birth year] Referring Physician: Jacalyn Lefevre Date of Service: 06/23/2022 [facility] [place]                                                        End Of Treatment Note  Diagnoses: C61-Malignant neoplasm of prostate  Cancer Staging: 89 y.o. gentleman with Stage T1c adenocarcinoma of the prostate with Gleason score of 4+3, and PSA of 19.36.   Intent: Curative  Radiation Treatment Dates: 05/05/2022 through 06/23/2022 Site Technique Total Dose (Gy) Dose per Fx (Gy) Completed Fx Beam Energies  Prostate: Prostate IMRT 70/70 2.5 28/28 6X   Narrative: The patient tolerated radiation therapy relatively well with only modest fatigue and nocturia x 3.  He denied any abdominal pain or bowel issues.  Plan: The patient will receive a call in about one month from the radiation oncology department. He will continue follow up with Dr. Claudia Desanctis as well.  ------------------------------------------------   Tyler Pita, MD [place]: [phone]  Fax: [phone] [email or web address]  Skype  [email or web address]

## 2022-08-05 ENCOUNTER — Ambulatory Visit
Admission: RE | Admit: 2022-08-05 | Discharge: 2022-08-05 | Disposition: A | Discharge status: Home / Self Care | Payer: Medicare Other | Source: Ambulatory Visit | Attending: Radiation Oncology | Admitting: Radiation Oncology

## 2022-08-05 NOTE — Progress Notes (Signed)
  Radiation Oncology         [phone] ________________________________  Name: Harold [name] MRN: [id number]  Date of Service: 08/05/2022  DOB: 07/02/1931  Post Treatment Telephone Note  Diagnosis:  89 y.o. gentleman with Stage T1c adenocarcinoma of the prostate with Gleason score of 4+3, and PSA of 19.36.   Intent: Curative  Radiation Treatment Dates: 05/05/2022 through 06/23/2022 Site Technique Total Dose (Gy) Dose per Fx (Gy) Completed Fx Beam Energies  Prostate: Prostate IMRT 70/70 2.5 28/28 6X   (as documented in provider EOT note)   Pre Treatment IPSS Score: 7 (as documented in the provider consult note)   The patient was available for call today.   Symptoms of fatigue have improved since completing therapy.  Symptoms of bladder changes have improved since completing therapy. Current symptoms include none, and medications for bladder symptoms include none.  Symptoms of bowel changes have  improved since completing therapy. Current symptoms include none, and medications for bowel symptoms include none.     Post Treatment IPSS Score: IPSS Questionnaire (AUA-7): Over the past month.   1)  How often have you had a sensation of not emptying your bladder completely after you finish urinating?  0 - Not at all  2)  How often have you had to urinate again less than two hours after you finished urinating? 0 - Not at all  3)  How often have you found you stopped and started again several times when you urinated?  0 - Not at all  4) How difficult have you found it to postpone urination?  0 - Not at all  5) How often have you had a weak urinary stream?  1 - Less than 1 time in 5  6) How often have you had to push or strain to begin urination?  0 - Not at all  7) How many times did you most typically get up to urinate from the time you went to bed until the time you got up in the morning?  2 - 2 times  Total score:  3. Which indicates mild symptoms  0-7 mildly symptomatic    8-19 moderately symptomatic   20-35 severely symptomatic    Patient has a scheduled follow up visit with his urologist, Dr. Claudia Desanctis, on 08/23/22 for ongoing surveillance. He was counseled that PSA levels will be drawn in the urology office, and was reassured that additional time is expected to improve bowel and bladder symptoms. He was encouraged to call back with concerns or questions regarding radiation.   This concludes the interview.   Leandra Kern, LPN

## 2022-08-12 DIAGNOSIS — Z961 Presence of intraocular lens: Secondary | ICD-10-CM | POA: Diagnosis not present

## 2022-08-12 DIAGNOSIS — H0102A Squamous blepharitis right eye, upper and lower eyelids: Secondary | ICD-10-CM | POA: Diagnosis not present

## 2022-08-12 DIAGNOSIS — H401111 Primary open-angle glaucoma, right eye, mild stage: Secondary | ICD-10-CM | POA: Diagnosis not present

## 2022-08-12 DIAGNOSIS — H401122 Primary open-angle glaucoma, left eye, moderate stage: Secondary | ICD-10-CM | POA: Diagnosis not present

## 2022-08-12 DIAGNOSIS — E119 Type 2 diabetes mellitus without complications: Secondary | ICD-10-CM | POA: Diagnosis not present

## 2022-08-12 DIAGNOSIS — H0102B Squamous blepharitis left eye, upper and lower eyelids: Secondary | ICD-10-CM | POA: Diagnosis not present

## 2022-08-27 DIAGNOSIS — M71571 Other bursitis, not elsewhere classified, right ankle and foot: Secondary | ICD-10-CM | POA: Diagnosis not present

## 2022-09-30 ENCOUNTER — Inpatient Hospital Stay: Payer: Medicare Other | Attending: Nurse Practitioner | Admitting: *Deleted

## 2022-09-30 ENCOUNTER — Encounter: Payer: Self-pay | Admitting: *Deleted

## 2022-09-30 DIAGNOSIS — C61 Malignant neoplasm of prostate: Secondary | ICD-10-CM

## 2022-09-30 NOTE — Progress Notes (Signed)
2 Identifiers were used for verification purposes. No vitals taken today as this was a telephone visit. Pt's daughter, Harold [name] was in attendance on the call. Allergies and medications were reviewed and updated. Vaccinations were reviewed. Pt denies pain today. He did say he gets tired from time to time , but is able to keep going. Pt denies smoking and drinking. Pt recently saw new PCP, Dr. Orson Aloe 3 mos ago. Pt states, he is using bathroom at nighttime more in the past 3-4 weeks than in the beginning after radiation. He says, he gets up 3 or more times per night. No burning with urination. He feels like he does empty bladder completely. Bowels unchanged since radiation. Last PSA was 1.06 on 4/9. Next visit with urologist is 4/17. SCP reviewed and completed.

## 2022-10-01 DIAGNOSIS — R3915 Urgency of urination: Secondary | ICD-10-CM | POA: Diagnosis not present

## 2022-11-24 ENCOUNTER — Other Ambulatory Visit: Payer: Self-pay | Admitting: *Deleted

## 2022-11-24 DIAGNOSIS — I739 Peripheral vascular disease, unspecified: Secondary | ICD-10-CM

## 2022-11-24 DIAGNOSIS — E11621 Type 2 diabetes mellitus with foot ulcer: Secondary | ICD-10-CM | POA: Diagnosis not present

## 2022-11-24 DIAGNOSIS — I2510 Atherosclerotic heart disease of native coronary artery without angina pectoris: Secondary | ICD-10-CM | POA: Diagnosis not present

## 2022-11-24 DIAGNOSIS — I110 Hypertensive heart disease with heart failure: Secondary | ICD-10-CM | POA: Diagnosis not present

## 2022-11-24 DIAGNOSIS — J449 Chronic obstructive pulmonary disease, unspecified: Secondary | ICD-10-CM | POA: Diagnosis not present

## 2022-11-24 DIAGNOSIS — L97519 Non-pressure chronic ulcer of other part of right foot with unspecified severity: Secondary | ICD-10-CM | POA: Diagnosis not present

## 2022-11-24 DIAGNOSIS — E1151 Type 2 diabetes mellitus with diabetic peripheral angiopathy without gangrene: Secondary | ICD-10-CM | POA: Diagnosis not present

## 2022-11-24 DIAGNOSIS — E559 Vitamin D deficiency, unspecified: Secondary | ICD-10-CM | POA: Diagnosis not present

## 2022-11-24 DIAGNOSIS — E7800 Pure hypercholesterolemia, unspecified: Secondary | ICD-10-CM | POA: Diagnosis not present

## 2022-11-24 DIAGNOSIS — E1165 Type 2 diabetes mellitus with hyperglycemia: Secondary | ICD-10-CM | POA: Diagnosis not present

## 2022-11-24 DIAGNOSIS — I509 Heart failure, unspecified: Secondary | ICD-10-CM | POA: Diagnosis not present

## 2022-11-30 IMAGING — DX DG FOOT COMPLETE 3+V*R*
3 series · 3 of 3 positions shown · non-contrast
Comparison: None.

CLINICAL DATA: Cellulitis.  Pain.

EXAM:
RIGHT FOOT COMPLETE - 3+ VIEW

[dg foot complete right (1 of 3)]
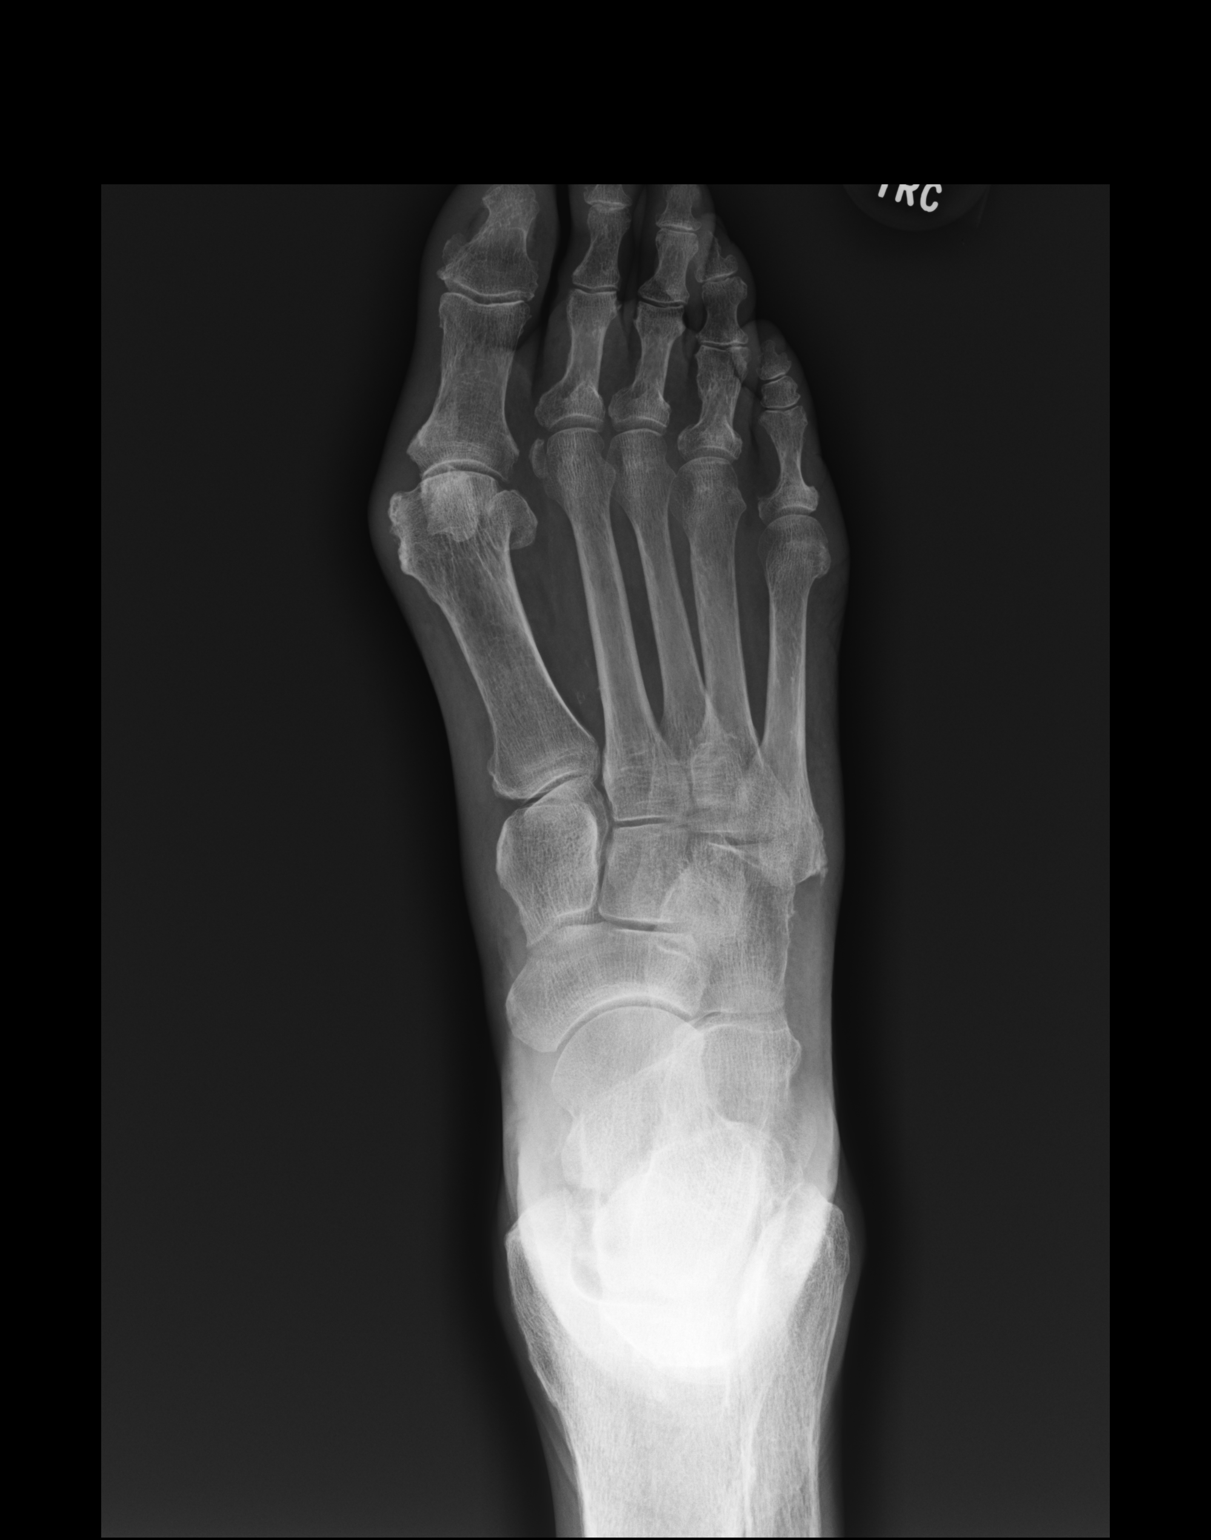

[dg foot complete right (2 of 3)]
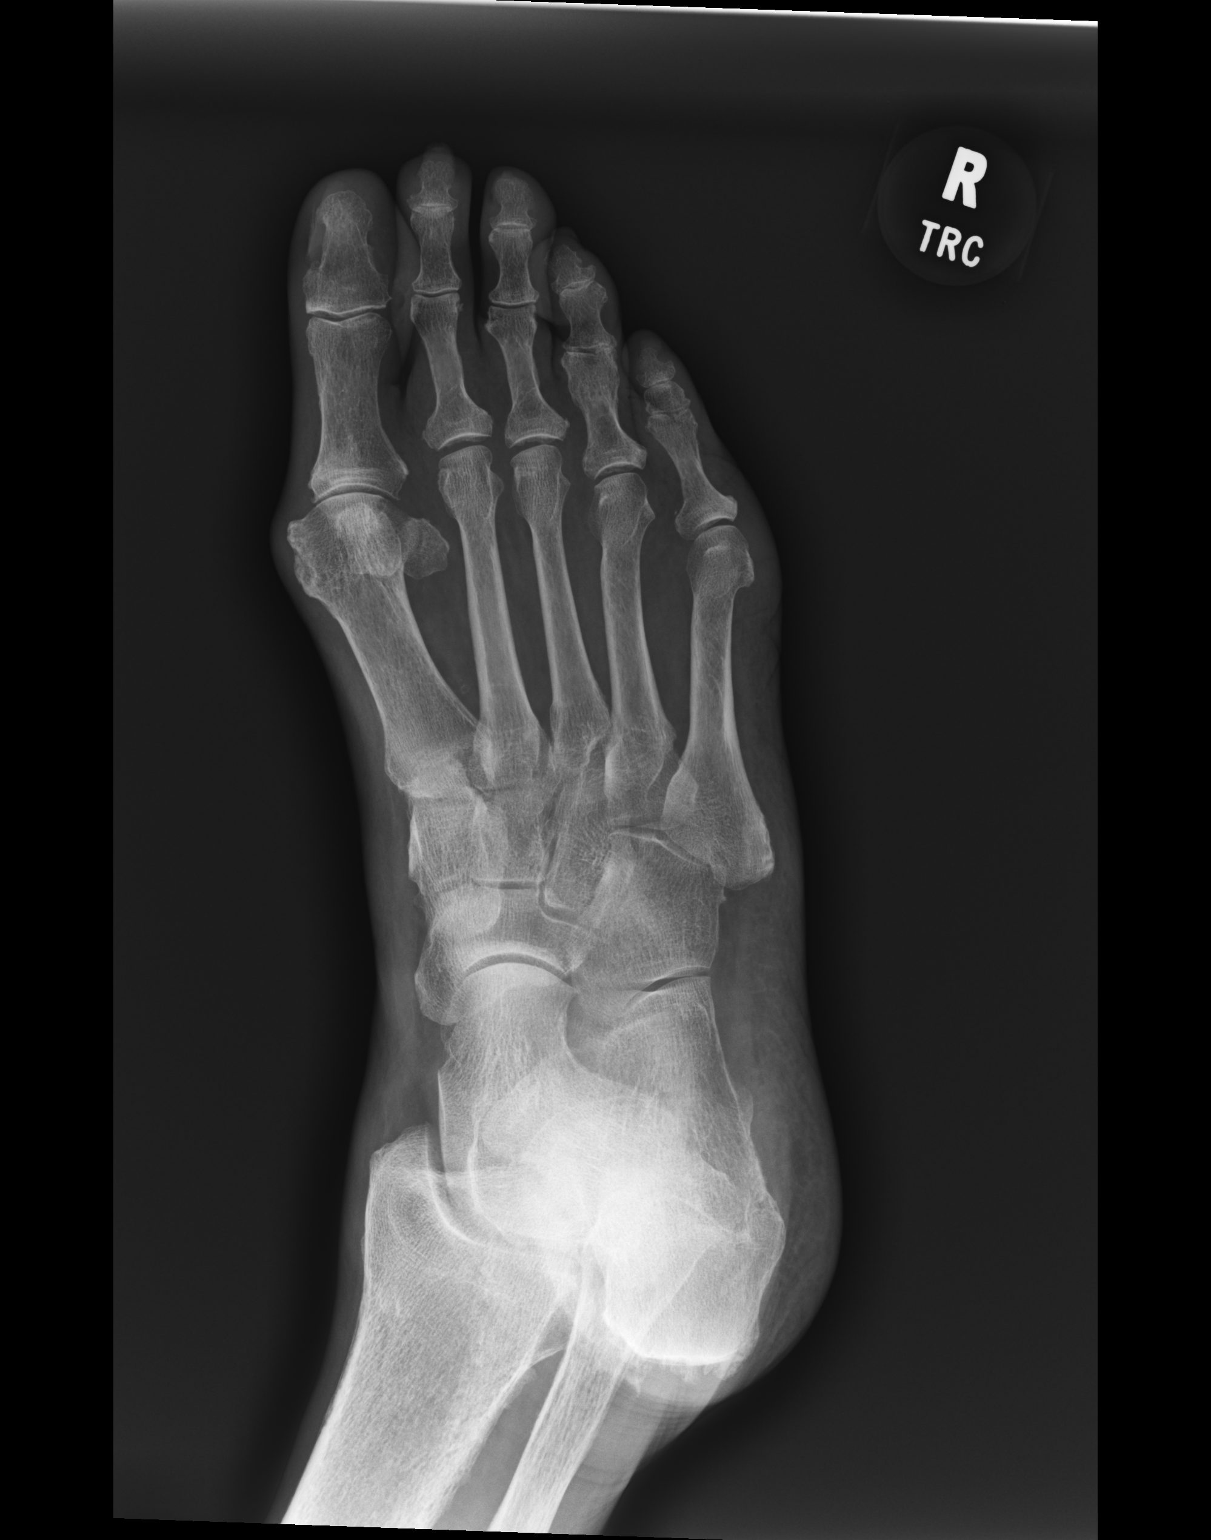

[dg foot complete right (3 of 3)]
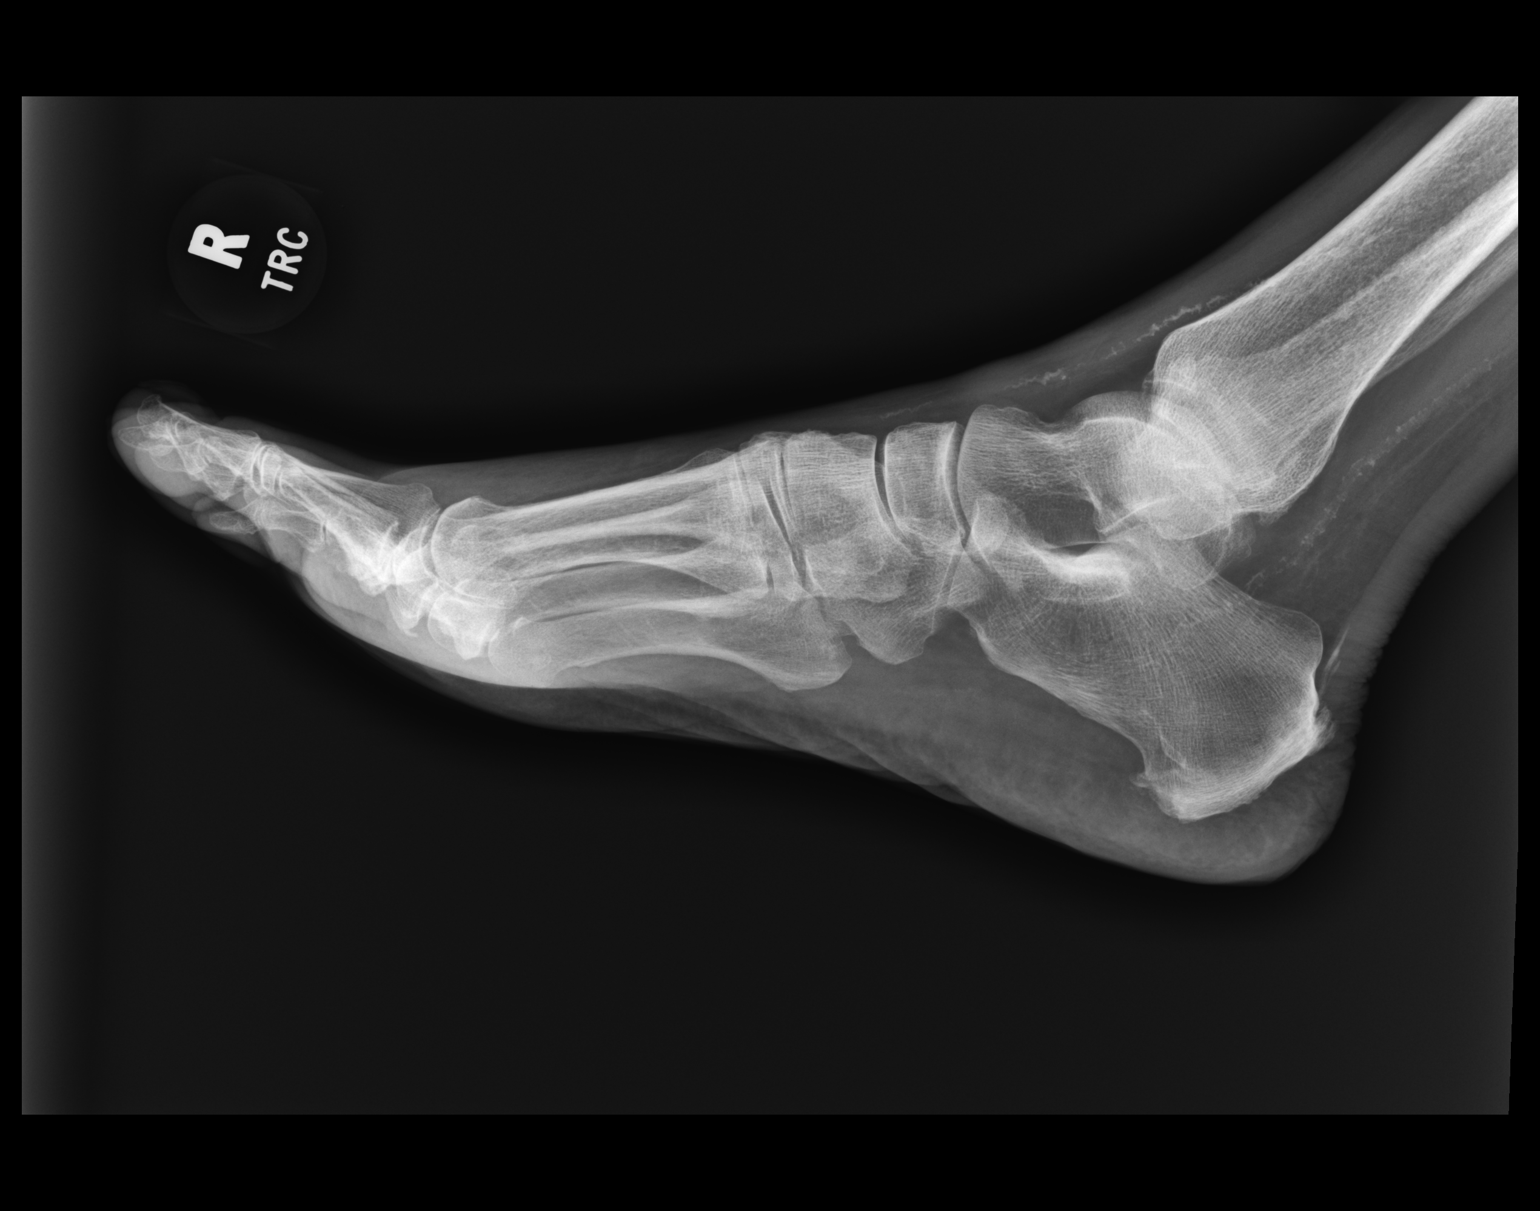

[3 of 3 positions shown; findings below may reference images not displayed]

FINDINGS: Hallux valgus deformity with degenerative changes at the first MTP
joint. Mild diffuse osteopenia. Plantar and posterior calcaneal heel
spurs. Vascular calcifications.
IMPRESSION: 1. No acute osseous findings.
2. Hallux valgus deformity.
3. Heel spurs.

## 2022-11-30 IMAGING — DX DG FOOT COMPLETE 3+V*L*
3 series · 3 of 3 positions shown · non-contrast
Comparison: None.

CLINICAL DATA: Cellulitis of the left foot.

EXAM:
LEFT FOOT - COMPLETE 3+ VIEW

[dg foot complete left (1 of 3)]
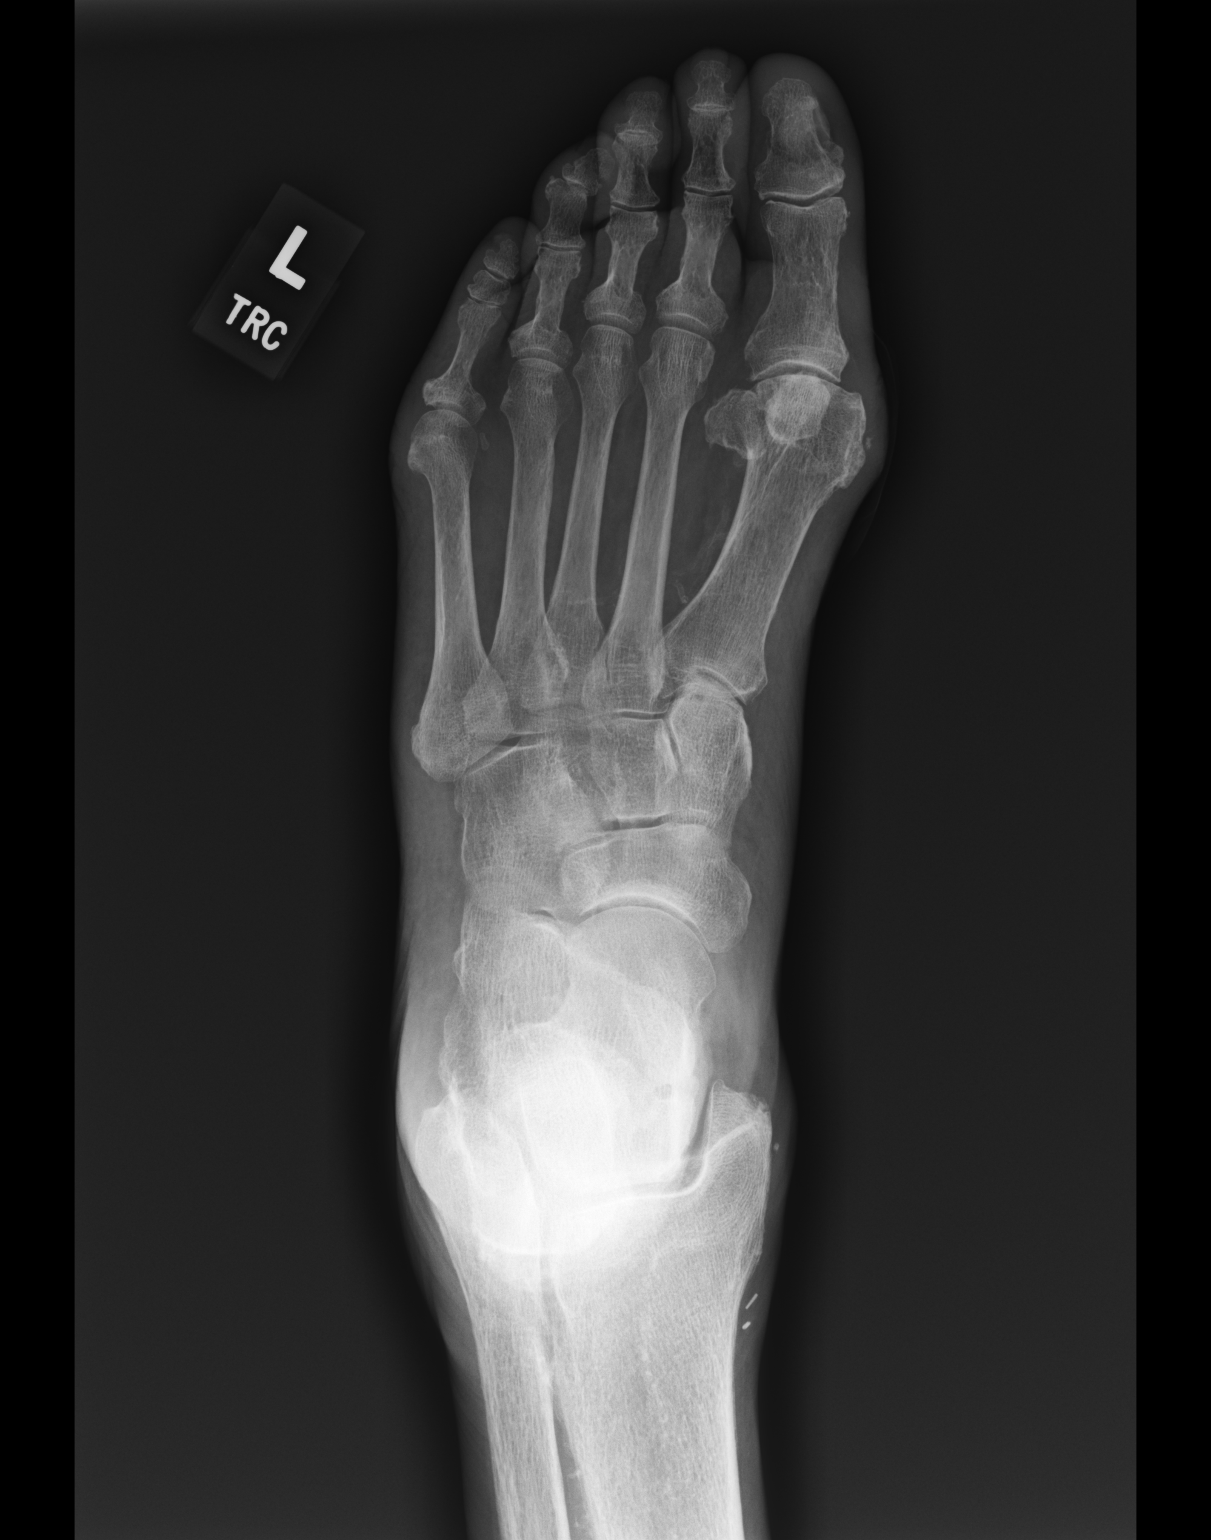

[dg foot complete left (2 of 3)]
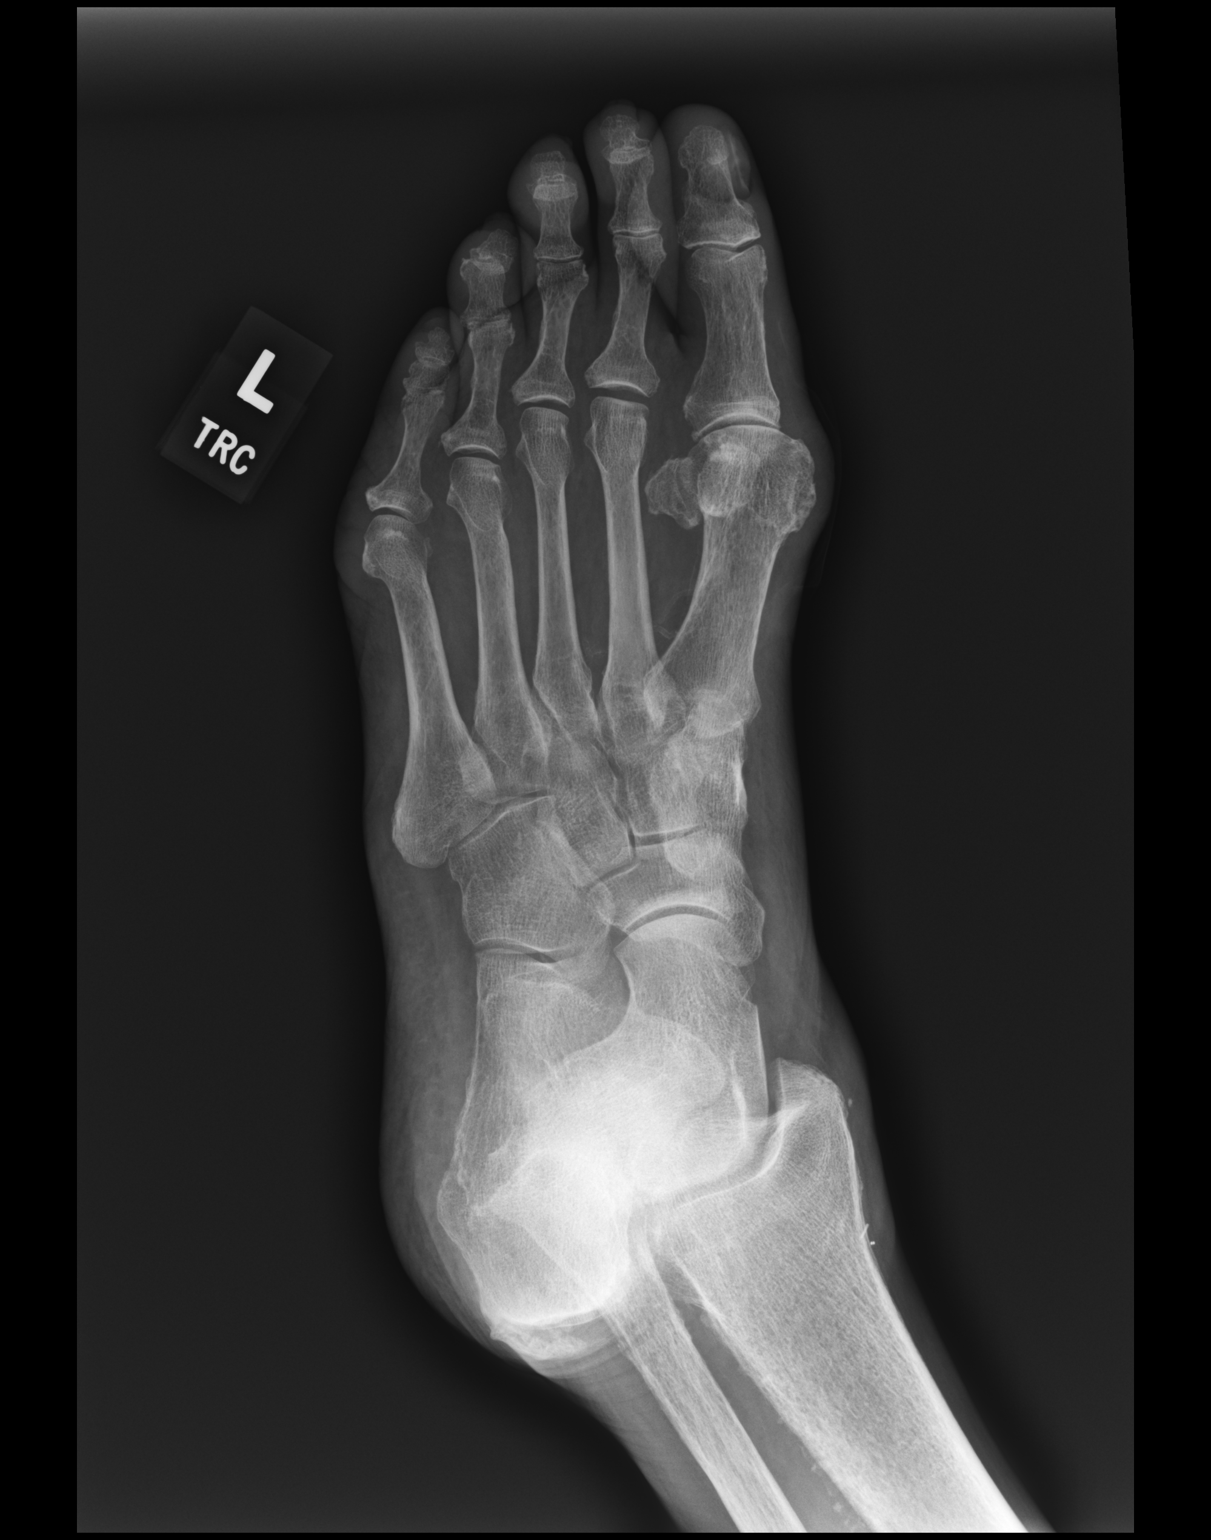

[dg foot complete left (3 of 3)]
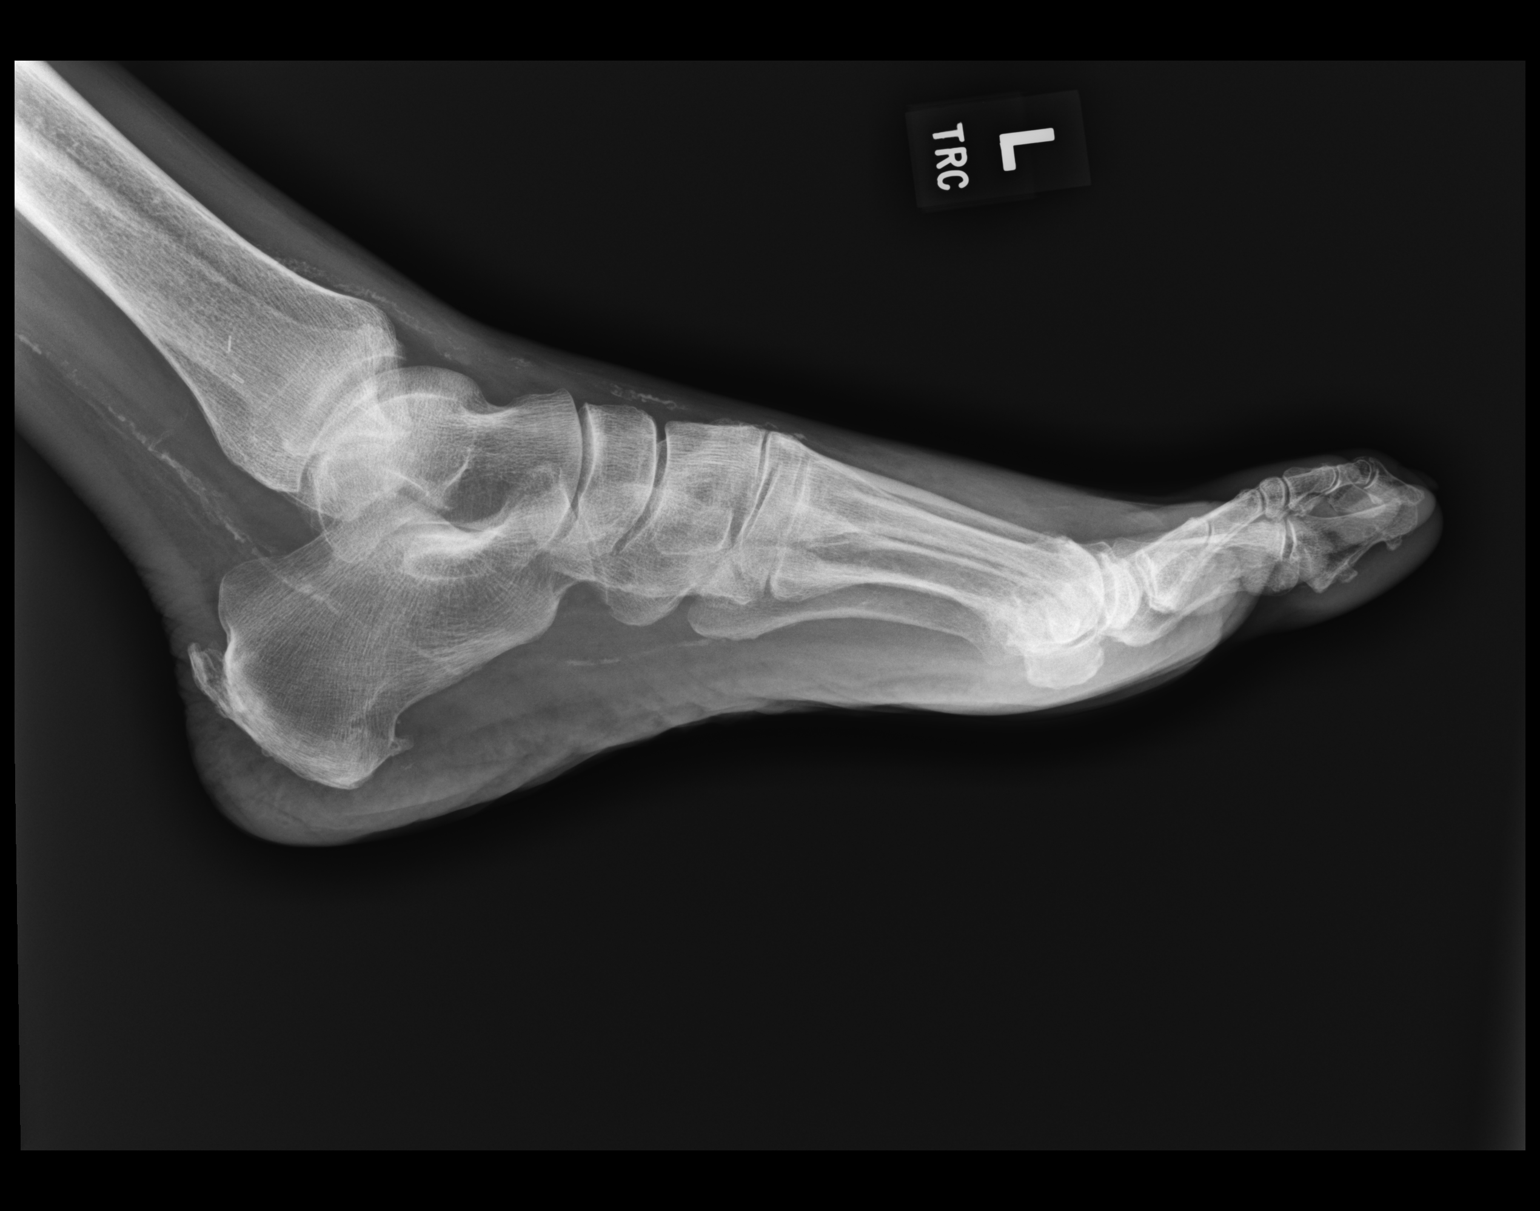

[3 of 3 positions shown; findings below may reference images not displayed]

FINDINGS: Three views study. No evidence for an acute fracture. No subluxation
or dislocation of the degenerative changes noted MTP joint great
toe. No gross bony destruction to suggest osteomyelitis.
IMPRESSION: Degenerative changes MTP joint great toe. No acute bony findings.

## 2022-12-02 ENCOUNTER — Encounter: Payer: Self-pay | Admitting: Vascular Surgery

## 2022-12-02 ENCOUNTER — Ambulatory Visit (HOSPITAL_COMMUNITY)
Admission: RE | Admit: 2022-12-02 | Discharge: 2022-12-02 | Disposition: A | Discharge status: Home / Self Care | Payer: Medicare Other | Source: Ambulatory Visit | Attending: Vascular Surgery | Admitting: Vascular Surgery

## 2022-12-02 ENCOUNTER — Ambulatory Visit: Payer: Medicare Other | Admitting: Vascular Surgery

## 2022-12-02 VITALS — BP 147/58 | HR 60 | Temp 97.6°F | Wt 136.0 lb

## 2022-12-02 DIAGNOSIS — I739 Peripheral vascular disease, unspecified: Secondary | ICD-10-CM | POA: Diagnosis not present

## 2022-12-02 NOTE — Progress Notes (Signed)
[facility] OF [place]  ASSESSMENT / PLAN: Harold [name] is a 89 y.o. male with atherosclerosis of native arteries of left lower extremity status post left common femoral to below knee popliteal bypass with PTFE 10/24/21.  Status post left first toe ray amputation 12/26/2021. This has healed nicely.  Recommend the following which can slow the progression of atherosclerosis and reduce the risk of major adverse cardiac / limb events:  Complete cessation from all tobacco products. Blood glucose control with goal A1c < 7%. Blood pressure control with goal blood pressure < 140/90 mmHg. Lipid reduction therapy with goal LDL-C <100 mg/dL (<14 if symptomatic from PAD).  Aspirin 81mg  PO QD.  Atorvastatin 40-80mg  PO QD (or other "high intensity" statin therapy).  He is insistent I amputate his right second toe. I see no compelling indication to do so. I counseled him about the same, but he desires another opinion. I will refer him to Nicki Guadalajara to discuss this further. I will continue to monitor his peripheral arterial disease with an ABI in 6 months.  CHIEF COMPLAINT: left foot ulceration  HISTORY OF PRESENT ILLNESS: Harold [name] is a 89 y.o. male who presents to clinic for evaluation of left foot ulcer.  This is been present for many weeks.  He has been under the care of Dr. Marylene Land, has been doing meticulous wound care.  An ankle-brachial index was ordered, and suggestive of peripheral arterial disease.  He was referred for further evaluation.  The patient is a spry, and energetic 89 year old looking much younger than his stated age.  He is reportedly quite active per his daughter who is in the room with him.  His daughter is a Engineer, civil (consulting) in the neonatal unit at [employer]  11/26/21: Patient returns for postoperative visit.  He underwent left common femoral to below-knee popliteal artery bypass on 10/24/2021.  He tolerated this well.  He returns to clinic for reevaluation.   Thankfully his left foot feels better.  The ulcer about his left foot appears to be healing.  Unfortunately recent x-ray showed some evidence of osteomyelitis about the first metatarsal head.  He is also developed some focal swelling about the popliteal incision.  12/24/21: Patient returns for follow-up.  He thinks his foot is doing better.  He continues to get IV antibiotics and local wound care at the wound care center.  He continues to refuse great toe ray amputation.  01/14/22: Patient returns to clinic for follow-up after left first toe ray amputation.  He is healed this very nicely.  He is walking as he likes.  He feels better overall.  Unfortunately, he has a highly elevated prostate-specific antigen and is being referred to urology for likely prostate cancer.  12/02/22: Returns to clinic.  Doing well overall.  His right second toe is very painful to him.  He wants to have it amputated.  I counseled him that I did not see a compelling reason to do so, but he was insistent.  I encouraged him to seek a second opinion about this.  He does not have any claudication type symptoms.  He has no rest pain symptoms.  He has no ulcers about his feet.  Left foot remains healed.  Past Medical History:  Diagnosis Date   Ambulates with cane    CAD (coronary artery disease)    cardiologist--- dr [name];  hx MI s/p PCI and stent x2 in appox 2001;  s/p cabg x3 in 2004   Chronic constipation  Diabetes mellitus type 2, diet-controlled (HCC)    followed by pcp    (04-18-2022 checks blood approx once per week)   Dyslipidemia    Essential hypertension    Full dentures    GERD (gastroesophageal reflux disease)    Glaucoma, both eyes    History of MI (myocardial infarction) 12/1999   s/p PCI/ stents   HOH (hard of hearing)    very hard HOH,  does not wear aids   Malignant neoplasm prostate [facility])    urologist--- dr pace/  radiation oncologist-- dr Kathrynn Running---  dx 09/ 2023, Gleason 4+3   Nocturia    OA  (osteoarthritis)    PAD (peripheral artery disease) (HCC)    vascular--- dr Lenell Antu;   05/ 2023  s/p left  FPBG   PONV (postoperative nausea and vomiting)    RBBB (right bundle branch block)    S/P CABG x 3 02/27/2003   S/P primary angioplasty with coronary stent 12/1999   x2   SOB (shortness of breath)    long distance and stairs sob but ok w/ household chores   Wears glasses     Past Surgical History:  Procedure Laterality Date   ABDOMINAL AORTOGRAM W/LOWER EXTREMITY N/A 10/11/2021   Procedure: ABDOMINAL AORTOGRAM W/LOWER EXTREMITY;  Surgeon: Leonie Douglas, MD;  Location: [facility] INVASIVE [facility];  Service: Cardiovascular;  Laterality: N/A;  Left Lower Extremity   AMPUTATION Left 12/26/2021   Procedure: LEFT FIRST TOE RAY AMPUTATION;  Surgeon: Leonie Douglas, MD;  Location: [facility] OR;  Service: Vascular;  Laterality: Left;   BACK SURGERY     1970s   CATARACT EXTRACTION W/ INTRAOCULAR LENS IMPLANT Left 2013   CHOLECYSTECTOMY N/A 03/02/2013   Procedure: LAPAROSCOPIC CHOLECYSTECTOMY;  Surgeon: Emelia Loron, MD;  Location[facility] OR;  Service: General;  Laterality: N/A;   CORONARY ANGIOPLASTY WITH STENT PLACEMENT  12/1999   @ [facility] by dr Amil Amen---  x2 stents   CORONARY ARTERY BYPASS GRAFT  02/27/2003   @[facility]  by dr Dorris Fetch;   x3   FEMORAL-POPLITEAL BYPASS GRAFT Left 10/24/2021   Procedure: BYPASS LEFT COMMON FEMORAL-BELOW KNEE POPLITEAL ARTERY WITH BY 80CM RING GRAFT;  Surgeon: Leonie Douglas, MD;  Location: [facility] OR;  Service: Vascular;  Laterality: Left;   GOLD SEED IMPLANT N/A 04/22/2022   Procedure: GOLD SEED IMPLANT;  Surgeon: Noel Christmas, MD;  Location: [facility];  Service: Urology;  Laterality: N/A;  30 MINS   SPACE OAR INSTILLATION N/A 04/22/2022   Procedure: SPACE OAR INSTILLATION;  Surgeon: Noel Christmas, MD;  Location: [facility];  Service: Urology;  Laterality: N/A;    Family History  Problem Relation Age of Onset   Heart disease  Mother    Diabetes Mother     Social History   Socioeconomic History   Marital status: Single    Spouse name: Not on file   Number of children: Not on file   Years of education: Not on file   Highest education level: Not on file  Occupational History   Not on file  Tobacco Use   Smoking status: Former    Years: 30    Types: Cigarettes    Quit date: 2003    Years since quitting: 21.4   Smokeless tobacco: Never  Vaping Use   Vaping Use: Never used  Substance and Sexual Activity   Alcohol use: Not Currently   Drug use: Never   Sexual activity: Not on file  Other  Topics Concern   Not on file  Social History Narrative   Not on file   Social Determinants of Health   Financial Resource Strain: Not on file  Food Insecurity: Not on file  Transportation Needs: Not on file  Physical Activity: Not on file  Stress: Not on file  Social Connections: Not on file  Intimate Partner Violence: Not on file    No Known Allergies  Current Outpatient Medications  Medication Sig Dispense Refill   ACCU-CHEK AVIVA PLUS test strip      Accu-Chek Softclix Lancets lancets SMARTSIG:Topical     amLODipine (NORVASC) 10 MG tablet Take 10 mg by mouth daily.     Cholecalciferol (VITAMIN D3) 50 MCG (2000 UT) TABS Take 2,000 Units by mouth daily.     dorzolamide-timolol (COSOPT) 22.3-6.8 MG/ML ophthalmic solution Place 1 drop into both eyes 2 (two) times daily.     gabapentin (NEURONTIN) 100 MG capsule Take 100 mg by mouth 3 (three) times daily.     latanoprost (XALATAN) 0.005 % ophthalmic solution Place 1 drop into both eyes at bedtime.     lisinopril-hydrochlorothiazide (ZESTORETIC) 20-25 MG tablet Take 1 tablet by mouth daily.     metFORMIN (GLUCOPHAGE-XR) 500 MG 24 hr tablet Take 500 mg by mouth daily with breakfast.     pantoprazole (PROTONIX) 40 MG tablet Take 40 mg by mouth daily.     Psyllium (METAMUCIL PO) Take by mouth as needed.     simvastatin (ZOCOR) 40 MG tablet Take 40 mg by mouth  daily at 6 PM.     No current facility-administered medications for this visit.    PHYSICAL EXAM Vitals:   12/02/22 1234  BP: (!) 147/58  Pulse: 60  Temp: 97.6 F (36.4 C)  TempSrc: Temporal  SpO2: 100%  Weight: 136 lb (61.7 kg)    Constitutional: well appearing elderly gentleman. no distress. Appears well nourished.  Cardiac: regular rate and rhythm.  Respiratory:  unlabored. Abdominal:  soft, non-tender, non-distended.  Peripheral vascular: Brisk Doppler flow in the foot.  Left foot healed nicely.   Right second toe with mild darkening of skin. No ulceration. The toe is tender.   PERTINENT LABORATORY AND RADIOLOGIC DATA  Most recent CBC    Latest Ref Rng & Units 04/22/2022   12:45 PM 12/27/2021    2:58 AM 12/26/2021    1:29 PM  CBC  WBC 4.0 - 10.5 K/uL  8.5    Hemoglobin 13.0 - 17.0 g/dL 16.1  9.7  09.6   Hematocrit 39.0 - 52.0 % 39.0  29.8  36.0   Platelets 150 - 400 K/uL  221       Most recent CMP    Latest Ref Rng & Units 04/22/2022   12:45 PM 12/27/2021    2:58 AM 12/26/2021    1:29 PM  CMP  Glucose 70 - 99 mg/dL 045  409  811   BUN 8 - 23 mg/dL 19  13  12    Creatinine 0.61 - 1.24 mg/dL 9.14  7.82  9.56   Sodium 135 - 145 mmol/L 142  141  143   Potassium 3.5 - 5.1 mmol/L 4.0  3.2  3.0   Chloride 98 - 111 mmol/L 103  109  104   CO2 22 - 32 mmol/L  25    Calcium 8.9 - 10.3 mg/dL  8.2     Vascular Imaging:   +-------+-----------+-----------+------------+------------+  ABI/TBIToday's ABIToday's TBIPrevious ABIPrevious TBI  +-------+-----------+-----------+------------+------------+  Right  [place]  0.56       [place]          0.53          +-------+-----------+-----------+------------+------------+  Left   [place]         0.64       [name]          0.30          +-------+-----------+-----------+------------+------------+   Rande Brunt. Lenell Antu, MD [facility] Phone Number: [phone] 12/02/2022 12:55  PM  Portions of this report may have been transcribed using voice recognition software.  Every effort has been made to ensure accuracy; however, inadvertent computerized transcription errors may still be present.

## 2022-12-03 LAB — VAS US ABI WITH/WO TBI

## 2022-12-13 ENCOUNTER — Other Ambulatory Visit: Payer: Self-pay

## 2022-12-13 DIAGNOSIS — I739 Peripheral vascular disease, unspecified: Secondary | ICD-10-CM

## 2023-02-09 DIAGNOSIS — E119 Type 2 diabetes mellitus without complications: Secondary | ICD-10-CM | POA: Diagnosis not present

## 2023-02-09 DIAGNOSIS — H0102A Squamous blepharitis right eye, upper and lower eyelids: Secondary | ICD-10-CM | POA: Diagnosis not present

## 2023-02-09 DIAGNOSIS — H401122 Primary open-angle glaucoma, left eye, moderate stage: Secondary | ICD-10-CM | POA: Diagnosis not present

## 2023-02-09 DIAGNOSIS — Z961 Presence of intraocular lens: Secondary | ICD-10-CM | POA: Diagnosis not present

## 2023-02-09 DIAGNOSIS — H0102B Squamous blepharitis left eye, upper and lower eyelids: Secondary | ICD-10-CM | POA: Diagnosis not present

## 2023-02-09 DIAGNOSIS — H401111 Primary open-angle glaucoma, right eye, mild stage: Secondary | ICD-10-CM | POA: Diagnosis not present

## 2023-05-26 ENCOUNTER — Ambulatory Visit (HOSPITAL_COMMUNITY): Payer: Medicare Other

## 2023-05-26 ENCOUNTER — Ambulatory Visit: Payer: Medicare Other | Admitting: Vascular Surgery

## 2023-06-03 DIAGNOSIS — E7800 Pure hypercholesterolemia, unspecified: Secondary | ICD-10-CM | POA: Diagnosis not present

## 2023-06-03 DIAGNOSIS — Z79899 Other long term (current) drug therapy: Secondary | ICD-10-CM | POA: Diagnosis not present

## 2023-06-03 DIAGNOSIS — E1165 Type 2 diabetes mellitus with hyperglycemia: Secondary | ICD-10-CM | POA: Diagnosis not present

## 2023-06-03 DIAGNOSIS — E559 Vitamin D deficiency, unspecified: Secondary | ICD-10-CM | POA: Diagnosis not present

## 2023-06-03 DIAGNOSIS — E1151 Type 2 diabetes mellitus with diabetic peripheral angiopathy without gangrene: Secondary | ICD-10-CM | POA: Diagnosis not present

## 2023-06-03 DIAGNOSIS — I110 Hypertensive heart disease with heart failure: Secondary | ICD-10-CM | POA: Diagnosis not present

## 2023-06-03 DIAGNOSIS — I2510 Atherosclerotic heart disease of native coronary artery without angina pectoris: Secondary | ICD-10-CM | POA: Diagnosis not present

## 2023-06-03 DIAGNOSIS — Z0000 Encounter for general adult medical examination without abnormal findings: Secondary | ICD-10-CM | POA: Diagnosis not present

## 2023-06-03 DIAGNOSIS — I509 Heart failure, unspecified: Secondary | ICD-10-CM | POA: Diagnosis not present

## 2023-06-03 DIAGNOSIS — J449 Chronic obstructive pulmonary disease, unspecified: Secondary | ICD-10-CM | POA: Diagnosis not present

## 2023-06-18 DIAGNOSIS — I10 Essential (primary) hypertension: Secondary | ICD-10-CM | POA: Diagnosis not present

## 2023-08-05 DIAGNOSIS — H0102A Squamous blepharitis right eye, upper and lower eyelids: Secondary | ICD-10-CM | POA: Diagnosis not present

## 2023-08-05 DIAGNOSIS — H401122 Primary open-angle glaucoma, left eye, moderate stage: Secondary | ICD-10-CM | POA: Diagnosis not present

## 2023-08-05 DIAGNOSIS — Z961 Presence of intraocular lens: Secondary | ICD-10-CM | POA: Diagnosis not present

## 2023-08-05 DIAGNOSIS — H401111 Primary open-angle glaucoma, right eye, mild stage: Secondary | ICD-10-CM | POA: Diagnosis not present

## 2023-08-05 DIAGNOSIS — E119 Type 2 diabetes mellitus without complications: Secondary | ICD-10-CM | POA: Diagnosis not present

## 2023-08-05 DIAGNOSIS — H0102B Squamous blepharitis left eye, upper and lower eyelids: Secondary | ICD-10-CM | POA: Diagnosis not present

## 2023-08-26 DIAGNOSIS — E1151 Type 2 diabetes mellitus with diabetic peripheral angiopathy without gangrene: Secondary | ICD-10-CM | POA: Diagnosis not present

## 2023-08-26 DIAGNOSIS — E1142 Type 2 diabetes mellitus with diabetic polyneuropathy: Secondary | ICD-10-CM | POA: Diagnosis not present

## 2023-10-07 DIAGNOSIS — I10 Essential (primary) hypertension: Secondary | ICD-10-CM | POA: Diagnosis not present

## 2023-10-07 DIAGNOSIS — I739 Peripheral vascular disease, unspecified: Secondary | ICD-10-CM | POA: Diagnosis not present

## 2023-10-07 DIAGNOSIS — M79604 Pain in right leg: Secondary | ICD-10-CM | POA: Diagnosis not present

## 2023-10-07 DIAGNOSIS — M79605 Pain in left leg: Secondary | ICD-10-CM | POA: Diagnosis not present

## 2023-10-07 DIAGNOSIS — E1165 Type 2 diabetes mellitus with hyperglycemia: Secondary | ICD-10-CM | POA: Diagnosis not present

## 2023-11-14 DIAGNOSIS — J449 Chronic obstructive pulmonary disease, unspecified: Secondary | ICD-10-CM | POA: Diagnosis not present

## 2023-11-14 DIAGNOSIS — I10 Essential (primary) hypertension: Secondary | ICD-10-CM | POA: Diagnosis not present

## 2023-11-14 DIAGNOSIS — I2510 Atherosclerotic heart disease of native coronary artery without angina pectoris: Secondary | ICD-10-CM | POA: Diagnosis not present

## 2023-11-14 DIAGNOSIS — I5032 Chronic diastolic (congestive) heart failure: Secondary | ICD-10-CM | POA: Diagnosis not present

## 2023-12-10 DIAGNOSIS — E7800 Pure hypercholesterolemia, unspecified: Secondary | ICD-10-CM | POA: Diagnosis not present

## 2023-12-10 DIAGNOSIS — I739 Peripheral vascular disease, unspecified: Secondary | ICD-10-CM | POA: Diagnosis not present

## 2023-12-10 DIAGNOSIS — I2510 Atherosclerotic heart disease of native coronary artery without angina pectoris: Secondary | ICD-10-CM | POA: Diagnosis not present

## 2023-12-10 DIAGNOSIS — I5032 Chronic diastolic (congestive) heart failure: Secondary | ICD-10-CM | POA: Diagnosis not present

## 2023-12-10 DIAGNOSIS — I509 Heart failure, unspecified: Secondary | ICD-10-CM | POA: Diagnosis not present

## 2023-12-10 DIAGNOSIS — J449 Chronic obstructive pulmonary disease, unspecified: Secondary | ICD-10-CM | POA: Diagnosis not present

## 2023-12-10 DIAGNOSIS — I10 Essential (primary) hypertension: Secondary | ICD-10-CM | POA: Diagnosis not present

## 2023-12-10 DIAGNOSIS — E1165 Type 2 diabetes mellitus with hyperglycemia: Secondary | ICD-10-CM | POA: Diagnosis not present

## 2023-12-10 DIAGNOSIS — E559 Vitamin D deficiency, unspecified: Secondary | ICD-10-CM | POA: Diagnosis not present

## 2024-01-04 DIAGNOSIS — I2510 Atherosclerotic heart disease of native coronary artery without angina pectoris: Secondary | ICD-10-CM | POA: Diagnosis not present

## 2024-01-04 DIAGNOSIS — R918 Other nonspecific abnormal finding of lung field: Secondary | ICD-10-CM | POA: Diagnosis not present

## 2024-01-04 DIAGNOSIS — R042 Hemoptysis: Secondary | ICD-10-CM | POA: Diagnosis not present

## 2024-01-04 DIAGNOSIS — R059 Cough, unspecified: Secondary | ICD-10-CM | POA: Diagnosis not present

## 2024-01-04 DIAGNOSIS — J984 Other disorders of lung: Secondary | ICD-10-CM | POA: Diagnosis not present

## 2024-01-04 DIAGNOSIS — J90 Pleural effusion, not elsewhere classified: Secondary | ICD-10-CM | POA: Diagnosis not present

## 2024-01-14 DIAGNOSIS — I5032 Chronic diastolic (congestive) heart failure: Secondary | ICD-10-CM | POA: Diagnosis not present

## 2024-01-14 DIAGNOSIS — I2510 Atherosclerotic heart disease of native coronary artery without angina pectoris: Secondary | ICD-10-CM | POA: Diagnosis not present

## 2024-01-14 DIAGNOSIS — J449 Chronic obstructive pulmonary disease, unspecified: Secondary | ICD-10-CM | POA: Diagnosis not present

## 2024-01-14 DIAGNOSIS — I10 Essential (primary) hypertension: Secondary | ICD-10-CM | POA: Diagnosis not present

## 2024-01-27 ENCOUNTER — Telehealth: Payer: Self-pay | Admitting: *Deleted

## 2024-01-27 NOTE — Telephone Encounter (Signed)
 Called [facility] to request records for this patient prior to his visit on 01/28/24.  I had to leave a VM for [facility].  I left a detailed message with the patient's name, DOB and that we need records regarding the consult (Mass of right lung mass and Hemoptysis) as well as where he has had imaging done, we need the images. I left our phone number and fax number.   Will await fax.

## 2024-01-28 ENCOUNTER — Ambulatory Visit

## 2024-01-28 VITALS — BP 158/55 | HR 66 | Temp 97.7°F | Ht 69.0 in | Wt 143.4 lb

## 2024-01-28 DIAGNOSIS — R918 Other nonspecific abnormal finding of lung field: Secondary | ICD-10-CM | POA: Diagnosis not present

## 2024-01-28 DIAGNOSIS — R042 Hemoptysis: Secondary | ICD-10-CM

## 2024-01-28 DIAGNOSIS — Z87891 Personal history of nicotine dependence: Secondary | ICD-10-CM | POA: Diagnosis not present

## 2024-01-28 NOTE — Patient Instructions (Signed)
 You were seen in the pulmonary clinic for possibly coughing up blood. We are not sure if it was Jello or blood. Your chest xray report suggests you might have a lung mass. We will get a chest CT scan to look for any masses that you might have. We can discuss what to do next after we get the scan.

## 2024-01-28 NOTE — Progress Notes (Signed)
 Subjective:   PATIENT ID: Harold [name] GENDER: male DOB: July 18, 1931, MRN: [id number]   HPI Harold [name] is an 89 year old male with a past medical history of coronary artery disease status post stent placement in 2002 and CABG in 2004, peripheral arterial disease with left big toe amputation, chronic pleural effusion, prostate cancer, hypertension, diabetes, former smoker (50-pack-year estimated smoking history).  He does not have noted emphysema or COPD in his medical history.  He is not on any inhalers.  Patient is present with his daughter to assist in obtaining history.  Patient states that he never had any hemoptysis and states that he suspects it was Jell-O.  Daughter states that he has had aspiration episodes in the past and he was recommended to be on a thickened diet however he refused.   Daughter states that he reported coughing up blood on 20 July.  She states that he reported that he was coughing up blood for a whole week.  She states that she has not noted any more episodes of hemoptysis since then.  He went and saw his PCP where he underwent an x-ray which I was able to see the report only.  The chest x-ray suggests that he has a pleural effusion which I was able to see on prior imaging it appears chronic and has calcifications and it.  It was also noted to have masslike consolidation.  Patient denies any fevers or chills.  Denies any shortness of breath.  Denies any phlegm production.  He states that his appetite has always been poor since his prostate cancer but he says that his weight has been stable.     Past Medical History:  Diagnosis Date   Ambulates with cane    CAD (coronary artery disease)    cardiologist--- dr anner;  hx MI s/p PCI and stent x2 in appox 2001;  s/p cabg x3 in 2004   Chronic constipation    Diabetes mellitus type 2, diet-controlled (HCC)    followed by pcp    (04-18-2022 checks blood approx once per week)   Dyslipidemia    Essential  hypertension    Full dentures    GERD (gastroesophageal reflux disease)    Glaucoma, both eyes    History of MI (myocardial infarction) 12/1999   s/p PCI/ stents   HOH (hard of hearing)    very hard HOH,  does not wear aids   Malignant neoplasm prostate [facility])    urologist--- dr pace/  radiation oncologist-- dr patrcia---  dx 09/ 2023, Gleason 4+3   Nocturia    OA (osteoarthritis)    PAD (peripheral artery disease) (HCC)    vascular--- dr magda;   05/ 2023  s/p left  FPBG   PONV (postoperative nausea and vomiting)    RBBB (right bundle branch block)    S/P CABG x 3 02/27/2003   S/P primary angioplasty with coronary stent 12/1999   x2   SOB (shortness of breath)    long distance and stairs sob but ok w/ household chores   Wears glasses      Family History  Problem Relation Age of Onset   Heart disease Mother    Diabetes Mother      Social History   Socioeconomic History   Marital status: Single    Spouse name: Not on file   Number of children: Not on file   Years of education: Not on file   Highest education level: Not on file  Occupational History  Not on file  Tobacco Use   Smoking status: Former    Current packs/day: 0.00    Types: Cigarettes    Start date: 20    Quit date: 2003    Years since quitting: 22.6   Smokeless tobacco: Never  Vaping Use   Vaping status: Never Used  Substance and Sexual Activity   Alcohol use: Not Currently   Drug use: Never   Sexual activity: Not on file  Other Topics Concern   Not on file  Social History Narrative   Not on file   Social Drivers of Health   Financial Resource Strain: Not on file  Food Insecurity: Not on file  Transportation Needs: Not on file  Physical Activity: Not on file  Stress: Not on file  Social Connections: Not on file  Intimate Partner Violence: Not on file     No Known Allergies   Outpatient Medications Prior to Visit  Medication Sig Dispense Refill   ACCU-CHEK AVIVA PLUS test strip       Accu-Chek Softclix Lancets lancets SMARTSIG:Topical     amLODipine  (NORVASC ) 10 MG tablet Take 10 mg by mouth daily.     Cholecalciferol  (VITAMIN D3) 50 MCG (2000 UT) TABS Take 2,000 Units by mouth daily.     dorzolamide -timolol  (COSOPT ) 22.3-6.8 MG/ML ophthalmic solution Place 1 drop into both eyes 2 (two) times daily.     gabapentin  (NEURONTIN ) 100 MG capsule Take 100 mg by mouth 3 (three) times daily.     latanoprost  (XALATAN ) 0.005 % ophthalmic solution Place 1 drop into both eyes at bedtime.     lisinopril -hydrochlorothiazide  (ZESTORETIC ) 20-25 MG tablet Take 1 tablet by mouth daily.     metFORMIN  (GLUCOPHAGE -XR) 500 MG 24 hr tablet Take 500 mg by mouth daily with breakfast.     pantoprazole  (PROTONIX ) 40 MG tablet Take 40 mg by mouth daily.     Psyllium (METAMUCIL PO) Take by mouth as needed.     simvastatin (ZOCOR) 40 MG tablet Take 40 mg by mouth daily at 6 PM.     No facility-administered medications prior to visit.    ROS Reviewed all systems and reported negative except as above     Objective:   Vitals:   01/28/24 1111  TempSrc: Oral  Weight: 143 lb 6.4 oz (65 kg)  Height: 5' 9 (1.753 m)    Physical Exam General: Elderly male appears appropriate to age Chest: Clear to auscultation, decreased air entry on the right side Cardiac: S1, normal S2, no MRG Neuro: Grossly intact     CBC    Component Value Date/Time   WBC 8.5 12/27/2021 0258   RBC 3.25 (L) 12/27/2021 0258   HGB 13.3 04/22/2022 1245   HCT 39.0 04/22/2022 1245   PLT 221 12/27/2021 0258   MCV 91.7 12/27/2021 0258   MCH 29.8 12/27/2021 0258   MCHC 32.6 12/27/2021 0258   RDW 14.2 12/27/2021 0258   LYMPHSABS 1.2 12/25/2021 1116   MONOABS 0.6 12/25/2021 1116   EOSABS 0.0 12/25/2021 1116   BASOSABS 0.0 12/25/2021 1116     Chest imaging: As mentioned above      Assessment & Plan:   1. Hemoptysis (Primary) Patient reported hemoptysis mid July.  He states that he has not had any more  hemoptysis.  He is not sure if it was blood or Jell-O.  We discussed obtaining a CT chest to gather more information.  We discussed should there be a lung mass, the most likely neck step  would be a lung biopsy and whether he would be interested in that.  At this time he is interested in obtaining the CT chest to obtain more information.  We decided to discuss further after the CT chest regarding potentially doing a biopsy or doing no interventions at all.  Patient was present for the entire conversation and is in agreement with this plan. - CT Chest Wo Contrast; Future  2. Lung mass As mentioned above - CT Chest Wo Contrast; Future  3. Former smoker             Zola Herter, MD [place] [facility] Office: [phone]

## 2024-01-29 ENCOUNTER — Ambulatory Visit (HOSPITAL_COMMUNITY)

## 2024-01-29 NOTE — Telephone Encounter (Signed)
 No CT had been done, no OV, no ED visit.  Nothing further needed.

## 2024-01-31 ENCOUNTER — Ambulatory Visit (HOSPITAL_BASED_OUTPATIENT_CLINIC_OR_DEPARTMENT_OTHER)
Admission: RE | Admit: 2024-01-31 | Discharge: 2024-01-31 | Disposition: A | Discharge status: Home / Self Care | Source: Ambulatory Visit

## 2024-01-31 DIAGNOSIS — Z87891 Personal history of nicotine dependence: Secondary | ICD-10-CM | POA: Insufficient documentation

## 2024-01-31 DIAGNOSIS — J90 Pleural effusion, not elsewhere classified: Secondary | ICD-10-CM | POA: Diagnosis not present

## 2024-01-31 DIAGNOSIS — J984 Other disorders of lung: Secondary | ICD-10-CM | POA: Diagnosis not present

## 2024-01-31 DIAGNOSIS — R042 Hemoptysis: Secondary | ICD-10-CM | POA: Insufficient documentation

## 2024-01-31 DIAGNOSIS — R918 Other nonspecific abnormal finding of lung field: Secondary | ICD-10-CM | POA: Diagnosis not present

## 2024-02-01 ENCOUNTER — Telehealth: Payer: Self-pay

## 2024-02-01 NOTE — Telephone Encounter (Signed)
 I called the patient's daughter and gave her an update on the results of the chest CT mostly concerning for a lung cancer. We discussed options including obtaining a bronchoscopic biopsy under GA vs a percutaneous biopsy with IR with local anesthesia. Benefit of bronchoscopy is that we would also be able to biopsy his lymph nodes. She understands what each procedure entails and suspects his father would not want any procedures at all. She has already discussed this with him yesterday but she will talk to him again and reach out to us  about final decision.  I can refer him to palliative care in the community if they decide to approach this with conservative measures only.    Zola Herter, MD [place] [facility] Office: [phone]

## 2024-02-14 DIAGNOSIS — I2510 Atherosclerotic heart disease of native coronary artery without angina pectoris: Secondary | ICD-10-CM | POA: Diagnosis not present

## 2024-02-14 DIAGNOSIS — J449 Chronic obstructive pulmonary disease, unspecified: Secondary | ICD-10-CM | POA: Diagnosis not present

## 2024-02-14 DIAGNOSIS — I5032 Chronic diastolic (congestive) heart failure: Secondary | ICD-10-CM | POA: Diagnosis not present

## 2024-02-14 DIAGNOSIS — I10 Essential (primary) hypertension: Secondary | ICD-10-CM | POA: Diagnosis not present

## 2024-05-04 ENCOUNTER — Ambulatory Visit: Admitting: Podiatry

## 2024-05-04 DIAGNOSIS — B351 Tinea unguium: Secondary | ICD-10-CM

## 2024-05-04 DIAGNOSIS — M79672 Pain in left foot: Secondary | ICD-10-CM | POA: Diagnosis not present

## 2024-05-04 DIAGNOSIS — M79671 Pain in right foot: Secondary | ICD-10-CM | POA: Diagnosis not present

## 2024-05-04 NOTE — Progress Notes (Signed)
 Patient presents for evaluation and treatment of tenderness and some redness around nails feet.  Tenderness around toes with walking and wearing shoes.  Physical exam:  General appearance: Alert, pleasant, and in no acute distress.  Vascular: Pedal pulses: DP 2/4 B/L, PT 1/4 B/L.  Mild edema lower legs bilaterally.  Capillary refill time immediate bilaterally  Neurologic:  Dermatologic:  Nails thickened, disfigured, discolored 1-5 BL with subungual debris.  Redness and hypertrophic nail folds along nail folds bilaterally but no signs of drainage or infection.  Musculoskeletal:  Status post partial first ray amputation left   Diagnosis: 1. Painful onychomycotic nails 1 through 5 bilaterally. 2. Pain toes 1 through 5 bilaterally.  Plan: -Debrided onychomycotic nails 1 through 5 bilaterally.  Sharply debrided nails with nail clipper and reduced with a power bur.  Return 3 months [facility]

## 2024-07-15 ENCOUNTER — Ambulatory Visit: Payer: Self-pay

## 2024-07-15 NOTE — Telephone Encounter (Signed)
 Noted. NFN

## 2024-07-15 NOTE — Telephone Encounter (Signed)
 FYI Only or Action Required?: FYI only for provider: appointment scheduled on 07/18/24.  Patient was last seen in primary care on N/A.  Called Nurse Triage reporting Cough.  Symptoms began several months ago.  Interventions attempted: Nothing.  Symptoms are: gradually worsening.  Triage Disposition: See Within 3 Days in Office (overriding See Physician Within 24 Hours)  Patient/caregiver understands and will follow disposition?: Yes    Message from [place] sent at 07/15/2024 10:17 AM EST  Reason for Triage:  Daughter, Dionne, is calling Last CT of chest showed lung nodule Refused care originally but now symptoms getting worse Cough has gotten more frequent and worse Having pain in chest No wheezing or SOB   Pt of [facility])   Reason for Disposition  Change in color of sputum (e.g., from white to yellow-green sputum)  Answer Assessment - Initial Assessment Questions E2C2 Pulmonary Triage - Initial Assessment Questions Chief Complaint (e.g., cough, sob, wheezing, fever, chills, sweat or additional symptoms) *Go to specific symptom protocol after initial questions. Worsening cough, wants to discuss chest CT results (from August) and options (originally had declined work up or biopsy)  How long have symptoms been present? 2-3 months.  Have you tested for COVID or Flu? Note: If not, ask patient if a home test can be taken. If so, instruct patient to call back for positive results. No  MEDICINES:   Have you used any OTC meds to help with symptoms? No If yes, ask What medications? N/A  Have you used your inhalers/maintenance medication? No If yes, What medications? N/A  If inhaler, ask How many puffs and how often? Note: Review instructions on medication in the chart. N/A.  OXYGEN : Do you wear supplemental oxygen ? No If yes, How many liters are you supposed to use? N/A  Do you monitor your oxygen  levels? No If yes, What is your  reading (oxygen  level) today? Daughter was able to find pulse oximeter,  but it will not read on her finger or the patient's (fault device).  What is your usual oxygen  saturation reading?  (Note: Pulmonary O2 sats should be 90% or greater) N/A     1. ONSET: When did the cough begin?      2-3 months, worsening cough.  2. SEVERITY: How bad is the cough today?      Moderate. Comes and goes, worse in the evening and throughout the night  3. SPUTUM: Describe the color of your sputum (e.g., none, dry cough; clear, white, yellow, green)     Yellow.  4. HEMOPTYSIS: Are you coughing up any blood? If so ask: How much? (e.g., flecks, streaks, tablespoons, etc.)     No.  5. DIFFICULTY BREATHING: Are you having difficulty breathing? If Yes, ask: How bad is it? (e.g., mild, moderate, severe)      Yes, SOB when lying down at night. No SOB at this time and speaking in full sentences.  6. FEVER: Do you have a fever? If Yes, ask: What is your temperature, how was it measured, and when did it start?     No.  7. CARDIAC HISTORY: Do you have any history of heart disease? (e.g., heart attack, congestive heart failure)      Yes, triple bypass, cardiac stents and open heart surgery per daughter. CAD, PAD, HTN.  8. LUNG HISTORY: Do you have any history of lung disease?  (e.g., pulmonary embolus, asthma, emphysema)     Recent CT chest finding of lung nodule.  9. PE RISK FACTORS: Do you  have a history of blood clots? (or: recent major surgery, recent prolonged travel, bedridden)     No.  10. OTHER SYMPTOMS: Do you have any other symptoms? (e.g., runny nose, wheezing, chest pain)       Chest pain and sore throat from coughing, fatigue (able to do daily normal activities but feels worn out and has to rest after).  Protocols used: Cough - Chronic-A-AH

## 2024-07-18 ENCOUNTER — Ambulatory Visit

## 2024-07-18 DIAGNOSIS — R918 Other nonspecific abnormal finding of lung field: Secondary | ICD-10-CM

## 2024-08-02 ENCOUNTER — Ambulatory Visit

## 2024-08-04 ENCOUNTER — Ambulatory Visit: Admitting: Podiatry
